# Patient Record
Sex: Female | Born: 1990 | Race: Black or African American | Hispanic: No | Marital: Single | Smoking: Former smoker
Health system: Southern US, Community
[De-identification: ages and names within clinical notes are randomized; demographics above are authoritative.]

## PROBLEM LIST (undated history)

## (undated) ENCOUNTER — Inpatient Hospital Stay (HOSPITAL_COMMUNITY): Payer: Self-pay

## (undated) DIAGNOSIS — Z789 Other specified health status: Secondary | ICD-10-CM

## (undated) DIAGNOSIS — O139 Gestational [pregnancy-induced] hypertension without significant proteinuria, unspecified trimester: Secondary | ICD-10-CM

## (undated) DIAGNOSIS — Z9889 Other specified postprocedural states: Secondary | ICD-10-CM

## (undated) DIAGNOSIS — R112 Nausea with vomiting, unspecified: Secondary | ICD-10-CM

## (undated) HISTORY — PX: CLAVICLE SURGERY: SHX598

## (undated) HISTORY — PX: INDUCED ABORTION: SHX677

---
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
## 2011-01-04 NOTE — L&D Delivery Note (Signed)
Operative Delivery Note At 1:00 PM a viable female was delivered via .  Presentation: OA; Position: Left,; Station: +5.  Delivery of the head:   , McRoberts Second maneuver: , Woods screw (fetal shoulder rotation)  Fundal pressure was not applied.  The arm under the symphisis was: right  Weight 8 lb 8 oz (3856 g).   Placenta status: delivered intact/3VC     Anesthesia: Epidural  Episiotomy: None Lacerations: Periurethral Suture Repair: 3.0 vicryl rapide Est. Blood Loss (mL): 300 ml   Mom to postpartum.  Baby to nursery-stable.  JACKSON-MOORE,Jayden Rudge A 04/25/2011, 1:23 PM

## 2011-03-10 ENCOUNTER — Other Ambulatory Visit: Payer: Self-pay | Admitting: Obstetrics

## 2011-03-10 DIAGNOSIS — Z0489 Encounter for examination and observation for other specified reasons: Secondary | ICD-10-CM

## 2011-03-17 ENCOUNTER — Ambulatory Visit (HOSPITAL_COMMUNITY): Payer: Medicaid Other

## 2011-03-18 ENCOUNTER — Ambulatory Visit (HOSPITAL_COMMUNITY)
Admission: RE | Admit: 2011-03-18 | Discharge: 2011-03-18 | Disposition: A | Discharge status: Home / Self Care | Payer: Medicaid Other | Source: Ambulatory Visit | Attending: Obstetrics | Admitting: Obstetrics

## 2011-03-18 DIAGNOSIS — Z0489 Encounter for examination and observation for other specified reasons: Secondary | ICD-10-CM

## 2011-03-18 DIAGNOSIS — Z3689 Encounter for other specified antenatal screening: Secondary | ICD-10-CM | POA: Insufficient documentation

## 2011-03-24 LAB — OB RESULTS CONSOLE GBS: GBS: POSITIVE

## 2011-04-14 ENCOUNTER — Encounter (HOSPITAL_COMMUNITY): Payer: Self-pay | Admitting: *Deleted

## 2011-04-14 ENCOUNTER — Inpatient Hospital Stay (HOSPITAL_COMMUNITY)
Admission: AD | Admit: 2011-04-14 | Discharge: 2011-04-14 | Disposition: A | Discharge status: Home / Self Care | Payer: Medicaid Other | Attending: Obstetrics & Gynecology | Admitting: Obstetrics & Gynecology

## 2011-04-14 DIAGNOSIS — O479 False labor, unspecified: Secondary | ICD-10-CM | POA: Insufficient documentation

## 2011-04-14 NOTE — Progress Notes (Signed)
Dr. Tamela Oddi notified of pt presenting for labor check.  Notified of VE and ctx pattern.  Orders received to monitor for one hour and recheck cervix. If no change may dc home.

## 2011-04-14 NOTE — Discharge Instructions (Signed)
Return to MAU with any worsening symptoms.  Call your doctor with any concerns or questions.

## 2011-04-14 NOTE — MAU Note (Signed)
Pt reports contractions. Denies bleeding or ROM. 

## 2011-04-25 ENCOUNTER — Inpatient Hospital Stay (HOSPITAL_COMMUNITY): Payer: Medicaid Other | Admitting: Anesthesiology

## 2011-04-25 ENCOUNTER — Inpatient Hospital Stay (HOSPITAL_COMMUNITY)
Admission: AD | Admit: 2011-04-25 | Discharge: 2011-04-27 | DRG: 775 | Disposition: A | Discharge status: Home / Self Care | Payer: Medicaid Other | Source: Ambulatory Visit | Attending: Obstetrics & Gynecology | Admitting: Obstetrics & Gynecology

## 2011-04-25 ENCOUNTER — Encounter (HOSPITAL_COMMUNITY): Payer: Self-pay | Admitting: *Deleted

## 2011-04-25 ENCOUNTER — Encounter (HOSPITAL_COMMUNITY): Payer: Self-pay | Admitting: Anesthesiology

## 2011-04-25 ENCOUNTER — Encounter (HOSPITAL_COMMUNITY): Payer: Self-pay | Admitting: Obstetrics & Gynecology

## 2011-04-25 DIAGNOSIS — IMO0001 Reserved for inherently not codable concepts without codable children: Secondary | ICD-10-CM

## 2011-04-25 DIAGNOSIS — O99892 Other specified diseases and conditions complicating childbirth: Secondary | ICD-10-CM | POA: Diagnosis present

## 2011-04-25 DIAGNOSIS — Z22330 Carrier of Group B streptococcus: Secondary | ICD-10-CM

## 2011-04-25 LAB — CBC
HCT: 29.7 % — ABNORMAL LOW (ref 36.0–46.0)
Hemoglobin: 9.7 g/dL — ABNORMAL LOW (ref 12.0–15.0)
Platelets: 233 10*3/uL (ref 150–400)
WBC: 7 10*3/uL (ref 4.0–10.5)

## 2011-04-25 MED ORDER — FENTANYL 2.5 MCG/ML BUPIVACAINE 1/10 % EPIDURAL INFUSION (WH - ANES)
14.0000 mL/h | INTRAMUSCULAR | Status: DC
  Administered 2011-04-25: 14 mL/h via EPIDURAL
  Filled 2011-04-25 (×2): qty 60

## 2011-04-25 MED ORDER — WITCH HAZEL-GLYCERIN EX PADS: 1.0000 "application " | MEDICATED_PAD | CUTANEOUS | Status: DC | PRN

## 2011-04-25 MED ORDER — ZOLPIDEM TARTRATE 5 MG PO TABS: 5.0000 mg | ORAL_TABLET | Freq: Every evening | ORAL | Status: DC | PRN

## 2011-04-25 MED ORDER — LACTATED RINGERS IV SOLN: 500.0000 mL | INTRAVENOUS | Status: DC | PRN

## 2011-04-25 MED ORDER — FERROUS SULFATE 325 (65 FE) MG PO TABS
325.0000 mg | ORAL_TABLET | Freq: Two times a day (BID) | ORAL | Status: DC
  Administered 2011-04-25 – 2011-04-27 (×3): 325 mg via ORAL
  Filled 2011-04-25 (×4): qty 1

## 2011-04-25 MED ORDER — OXYCODONE-ACETAMINOPHEN 5-325 MG PO TABS
1.0000 | ORAL_TABLET | ORAL | Status: DC | PRN
  Administered 2011-04-26 (×2): 1 via ORAL
  Filled 2011-04-25 (×2): qty 1
  Filled 2011-04-25: qty 2

## 2011-04-25 MED ORDER — MEASLES, MUMPS & RUBELLA VAC ~~LOC~~ INJ: 0.5000 mL | INJECTION | Freq: Once | SUBCUTANEOUS | Status: DC

## 2011-04-25 MED ORDER — SENNOSIDES-DOCUSATE SODIUM 8.6-50 MG PO TABS
2.0000 | ORAL_TABLET | Freq: Every day | ORAL | Status: DC
  Administered 2011-04-25 – 2011-04-26 (×2): 2 via ORAL

## 2011-04-25 MED ORDER — EPHEDRINE 5 MG/ML INJ
10.0000 mg | INTRAVENOUS | Status: DC | PRN
  Filled 2011-04-25: qty 4
  Filled 2011-04-25: qty 2

## 2011-04-25 MED ORDER — EPHEDRINE 5 MG/ML INJ
10.0000 mg | INTRAVENOUS | Status: DC | PRN
  Filled 2011-04-25: qty 2

## 2011-04-25 MED ORDER — DIBUCAINE 1 % RE OINT: 1.0000 "application " | TOPICAL_OINTMENT | RECTAL | Status: DC | PRN

## 2011-04-25 MED ORDER — LANOLIN HYDROUS EX OINT: TOPICAL_OINTMENT | CUTANEOUS | Status: DC | PRN

## 2011-04-25 MED ORDER — OXYTOCIN BOLUS FROM INFUSION
500.0000 mL | Freq: Once | INTRAVENOUS | Status: DC
  Filled 2011-04-25: qty 1000
  Filled 2011-04-25: qty 500

## 2011-04-25 MED ORDER — TETANUS-DIPHTH-ACELL PERTUSSIS 5-2.5-18.5 LF-MCG/0.5 IM SUSP: 0.5000 mL | Freq: Once | INTRAMUSCULAR | Status: DC

## 2011-04-25 MED ORDER — ONDANSETRON HCL 4 MG PO TABS
4.0000 mg | ORAL_TABLET | ORAL | Status: DC | PRN
  Administered 2011-04-25: 4 mg via ORAL
  Filled 2011-04-25: qty 1

## 2011-04-25 MED ORDER — MAGNESIUM HYDROXIDE 400 MG/5ML PO SUSP: 30.0000 mL | ORAL | Status: DC | PRN

## 2011-04-25 MED ORDER — FLEET ENEMA 7-19 GM/118ML RE ENEM: 1.0000 | ENEMA | RECTAL | Status: DC | PRN

## 2011-04-25 MED ORDER — MEDROXYPROGESTERONE ACETATE 150 MG/ML IM SUSP: 150.0000 mg | INTRAMUSCULAR | Status: DC | PRN

## 2011-04-25 MED ORDER — LACTATED RINGERS IV SOLN
INTRAVENOUS | Status: DC
  Administered 2011-04-25: 10:00:00 via INTRAVENOUS

## 2011-04-25 MED ORDER — ONDANSETRON HCL 4 MG/2ML IJ SOLN: 4.0000 mg | INTRAMUSCULAR | Status: DC | PRN

## 2011-04-25 MED ORDER — LACTATED RINGERS IV SOLN
INTRAVENOUS | Status: DC
  Administered 2011-04-25 (×3): via INTRAVENOUS

## 2011-04-25 MED ORDER — IBUPROFEN 600 MG PO TABS
600.0000 mg | ORAL_TABLET | Freq: Four times a day (QID) | ORAL | Status: DC
  Administered 2011-04-25 – 2011-04-27 (×7): 600 mg via ORAL
  Filled 2011-04-25 (×8): qty 1

## 2011-04-25 MED ORDER — DIPHENHYDRAMINE HCL 50 MG/ML IJ SOLN: 12.5000 mg | INTRAMUSCULAR | Status: DC | PRN

## 2011-04-25 MED ORDER — PENICILLIN G POTASSIUM 5000000 UNITS IJ SOLR
2.5000 10*6.[IU] | INTRAMUSCULAR | Status: DC
  Administered 2011-04-25: 2.5 10*6.[IU] via INTRAVENOUS
  Filled 2011-04-25 (×5): qty 2.5

## 2011-04-25 MED ORDER — LIDOCAINE HCL (PF) 1 % IJ SOLN
30.0000 mL | INTRAMUSCULAR | Status: AC | PRN
  Administered 2011-04-25: 30 mL via SUBCUTANEOUS
  Filled 2011-04-25: qty 30

## 2011-04-25 MED ORDER — ACETAMINOPHEN 325 MG PO TABS: 650.0000 mg | ORAL_TABLET | ORAL | Status: DC | PRN

## 2011-04-25 MED ORDER — OXYTOCIN 20 UNITS IN LACTATED RINGERS INFUSION - SIMPLE
125.0000 mL/h | Freq: Once | INTRAVENOUS | Status: AC
  Administered 2011-04-25: 999 mL/h via INTRAVENOUS

## 2011-04-25 MED ORDER — PENICILLIN G POTASSIUM 5000000 UNITS IJ SOLR
5.0000 10*6.[IU] | Freq: Once | INTRAVENOUS | Status: AC
  Administered 2011-04-25: 5 10*6.[IU] via INTRAVENOUS
  Filled 2011-04-25: qty 5

## 2011-04-25 MED ORDER — MISOPROSTOL 50MCG HALF TABLET
75.0000 ug | ORAL_TABLET | ORAL | Status: DC
  Administered 2011-04-25: 75 ug via ORAL
  Filled 2011-04-25: qty 1
  Filled 2011-04-25: qty 0.75
  Filled 2011-04-25: qty 1

## 2011-04-25 MED ORDER — DIPHENHYDRAMINE HCL 25 MG PO CAPS: 25.0000 mg | ORAL_CAPSULE | Freq: Four times a day (QID) | ORAL | Status: DC | PRN

## 2011-04-25 MED ORDER — FENTANYL 2.5 MCG/ML BUPIVACAINE 1/10 % EPIDURAL INFUSION (WH - ANES)
INTRAMUSCULAR | Status: DC | PRN
  Administered 2011-04-25: 14 mL/h via EPIDURAL

## 2011-04-25 MED ORDER — LIDOCAINE HCL (PF) 1 % IJ SOLN
INTRAMUSCULAR | Status: DC | PRN
  Administered 2011-04-25 (×2): 8 mL

## 2011-04-25 MED ORDER — ONDANSETRON HCL 4 MG/2ML IJ SOLN
4.0000 mg | Freq: Four times a day (QID) | INTRAMUSCULAR | Status: DC | PRN
  Administered 2011-04-25: 4 mg via INTRAVENOUS
  Filled 2011-04-25: qty 2

## 2011-04-25 MED ORDER — IBUPROFEN 600 MG PO TABS: 600.0000 mg | ORAL_TABLET | Freq: Four times a day (QID) | ORAL | Status: DC | PRN

## 2011-04-25 MED ORDER — PRENATAL MULTIVITAMIN CH
1.0000 | ORAL_TABLET | Freq: Every day | ORAL | Status: DC
  Administered 2011-04-26 – 2011-04-27 (×2): 1 via ORAL
  Filled 2011-04-25: qty 1

## 2011-04-25 MED ORDER — LACTATED RINGERS IV SOLN: 500.0000 mL | Freq: Once | INTRAVENOUS | Status: DC

## 2011-04-25 MED ORDER — BENZOCAINE-MENTHOL 20-0.5 % EX AERO
1.0000 "application " | INHALATION_SPRAY | CUTANEOUS | Status: DC | PRN
  Filled 2011-04-25: qty 56

## 2011-04-25 MED ORDER — OXYCODONE-ACETAMINOPHEN 5-325 MG PO TABS: 1.0000 | ORAL_TABLET | ORAL | Status: DC | PRN

## 2011-04-25 MED ORDER — PHENYLEPHRINE 40 MCG/ML (10ML) SYRINGE FOR IV PUSH (FOR BLOOD PRESSURE SUPPORT)
80.0000 ug | PREFILLED_SYRINGE | INTRAVENOUS | Status: DC | PRN
  Filled 2011-04-25: qty 2

## 2011-04-25 MED ORDER — CITRIC ACID-SODIUM CITRATE 334-500 MG/5ML PO SOLN: 30.0000 mL | ORAL | Status: DC | PRN

## 2011-04-25 MED ORDER — PHENYLEPHRINE 40 MCG/ML (10ML) SYRINGE FOR IV PUSH (FOR BLOOD PRESSURE SUPPORT)
80.0000 ug | PREFILLED_SYRINGE | INTRAVENOUS | Status: DC | PRN
  Filled 2011-04-25: qty 5
  Filled 2011-04-25: qty 2

## 2011-04-25 NOTE — Anesthesia Preprocedure Evaluation (Addendum)

## 2011-04-25 NOTE — H&P (Signed)
Pamela Schaefer is Schaefer 21 y.o. female presenting for SROM, contractions. Maternal Medical History:  Reason for admission: Reason for admission: rupture of membranes and contractions.  Reason for Admission:   nauseaContractions: Frequency: regular.   Perceived severity is moderate.    Prenatal complications: no prenatal complications   OB History    Grav Para Term Preterm Abortions TAB SAB Ect Mult Living   3 1 1  0 1 0 1 0 0 1     Past Medical History  Diagnosis Date  . No pertinent past medical history    Past Surgical History  Procedure Date  . No past surgeries    Family History: family history includes Stroke in her maternal grandmother. Social History:  reports that she has quit smoking. She has never used smokeless tobacco. She reports that she does not drink alcohol or use illicit drugs.  Review of Systems  Constitutional: Negative for fever.  Eyes: Negative for blurred vision.  Respiratory: Negative for shortness of breath.   Gastrointestinal: Negative for nausea and vomiting.  Skin: Negative for rash.  Neurological: Negative for headaches.    Dilation: 5 Effacement (%): 70 Station: -2 Exam by:: Clearence Cheek RN Blood pressure 129/80, pulse 91, temperature 98.9 F (37.2 C), temperature source Oral, resp. rate 20, height 5\' 3"  (1.6 m), weight 79.379 kg (175 lb), last menstrual period 06/12/2010, SpO2 100.00%. Maternal Exam:  Uterine Assessment: Contraction frequency is irregular.   Abdomen: Patient reports no abdominal tenderness. Fetal presentation: vertex  Introitus: not evaluated.   Cervix: Cervix evaluated by digital exam.     Fetal Exam Fetal Monitor Review: Variability: moderate (6-25 bpm).   Pattern: early decelerations.    Fetal State Assessment: Category I - tracings are normal.     Physical Exam  Constitutional: She appears well-developed.  HENT:  Head: Normocephalic.  Neck: Neck supple. No thyromegaly present.  Cardiovascular: Normal  rate and regular rhythm.   Respiratory: Breath sounds normal.  GI: Soft. Bowel sounds are normal.  Skin: No rash noted.    Prenatal labs: ABO, Rh: --/--/O POS (07/15 1610) Antibody:   Rubella:   RPR:    HBsAg:    HIV:    GBS: Positive (03/21 0000)   Assessment/Plan: Primipara at term, active labor, Category 1 FHT. Admit, anticipate an NSVD  JACKSON-MOORE,Pamela Schaefer 04/25/2011, 8:51 AM

## 2011-04-25 NOTE — MAU Note (Signed)
Dr. Jackson-Moore notified of pt, orders rec'd. 

## 2011-04-25 NOTE — MAU Note (Signed)
Pt report ?leaking fluid since 0500, some contractions

## 2011-04-25 NOTE — Anesthesia Procedure Notes (Signed)
Epidural Patient location during procedure: OB Start time: 04/25/2011 8:15 AM End time: 04/25/2011 8:20 AM Reason for block: procedure for pain  Staffing Anesthesiologist: Sandrea Hughs  Preanesthetic Checklist Completed: patient identified, site marked, surgical consent, pre-op evaluation, timeout performed, IV checked, risks and benefits discussed and monitors and equipment checked  Epidural Patient position: sitting Prep: site prepped and draped and DuraPrep Patient monitoring: continuous pulse ox and blood pressure Approach: midline Injection technique: LOR air  Needle:  Needle type: Tuohy  Needle gauge: 17 G Needle length: 9 cm Needle insertion depth: 6 cm Catheter type: closed end flexible Catheter size: 19 Gauge Catheter at skin depth: 11 cm Test dose: negative and Other  Assessment Events: blood not aspirated, injection not painful, no injection resistance, negative IV test and no paresthesia

## 2011-04-26 NOTE — Anesthesia Postprocedure Evaluation (Signed)
  Anesthesia Post-op Note  Patient: Pamela Schaefer  Procedure(s) Performed: * No procedures listed * 119 Patient Location:   Anesthesia Type: Epidural  Level of Consciousness: awake, alert  and oriented  Airway and Oxygen Therapy: Patient Spontanous Breathing  Post-op Pain: mild  Post-op Assessment: Post-op Vital signs reviewed, Patient's Cardiovascular Status Stable, No headache, No backache, No residual numbness and No residual motor weakness  Post-op Vital Signs: Reviewed and stable  Complications: No apparent anesthesia complications

## 2011-04-26 NOTE — Progress Notes (Signed)
Post Partum Day 1 Subjective: no complaints, up ad lib, voiding, tolerating PO and + flatus  Objective: Blood pressure 112/69, pulse 64, temperature 98.3 F (36.8 C), temperature source Oral, resp. rate 18, height 5\' 3"  (1.6 m), weight 79.379 kg (175 lb), last menstrual period 06/12/2010, SpO2 98.00%, unknown if currently breastfeeding.  Physical Exam:  General: alert and no distress Lochia: appropriate Uterine Fundus: firm Incision: healing well DVT Evaluation: No evidence of DVT seen on physical exam.   Basename 04/25/11 0714  HGB 9.7*  HCT 29.7*    Assessment/Plan: Plan for discharge tomorrow   LOS: 1 day   Jaykwon Morones A 04/26/2011, 9:57 AM

## 2011-04-26 NOTE — Progress Notes (Signed)
UR chart review completed.  

## 2011-04-27 MED ORDER — OXYCODONE-ACETAMINOPHEN 5-325 MG PO TABS: 1.0000 | ORAL_TABLET | ORAL | Status: AC | PRN

## 2011-04-27 MED ORDER — IBUPROFEN 600 MG PO TABS: 600.0000 mg | ORAL_TABLET | Freq: Four times a day (QID) | ORAL | Status: DC

## 2011-04-27 NOTE — Progress Notes (Signed)
Post Partum Day 2 Subjective: no complaints  Objective: Blood pressure 105/65, pulse 69, temperature 98.7 F (37.1 C), temperature source Oral, resp. rate 18, height 5\' 3"  (1.6 m), weight 79.379 kg (175 lb), last menstrual period 06/12/2010, SpO2 98.00%, unknown if currently breastfeeding.  Physical Exam:  General: alert and no distress Lochia: appropriate Uterine Fundus: firm Incision: healing well DVT Evaluation: No evidence of DVT seen on physical exam.   Basename 04/25/11 0714  HGB 9.7*  HCT 29.7*    Assessment/Plan: Discharge home   LOS: 2 days   Pamela Schaefer A 04/27/2011, 7:46 AM

## 2011-04-27 NOTE — Discharge Summary (Signed)
Obstetric Discharge Summary Reason for Admission: onset of labor Prenatal Procedures: ultrasound Intrapartum Procedures: spontaneous vaginal delivery Postpartum Procedures: none Complications-Operative and Postpartum: none Hemoglobin  Date Value Range Status  04/25/2011 9.7* 12.0-15.0 (g/dL) Final     HCT  Date Value Range Status  04/25/2011 29.7* 36.0-46.0 (%) Final    Physical Exam:  General: alert and no distress Lochia: appropriate Uterine Fundus: firm Incision: healing well DVT Evaluation: No evidence of DVT seen on physical exam.  Discharge Diagnoses: Term Pregnancy-delivered  Discharge Information: Date: 04/27/2011 Activity: pelvic rest Diet: routine Medications: PNV, Ibuprofen, Colace and Percocet Condition: stable Instructions: refer to practice specific booklet Discharge to: home Follow-up Information    Follow up with Ling Flesch A, MD. Schedule an appointment as soon as possible for a visit in 6 weeks.   Contact information:   [address] [place] [address] [phone]          Newborn Data: Live born female  Birth Weight: 8 lb 8 oz (3856 g) APGAR: 8, 9  Home with mother.  Anelle Parlow A 04/27/2011, 8:32 AM

## 2011-04-27 NOTE — Addendum Note (Signed)
Addendum  created 04/27/11 1251 by Sandrea Hughs., MD   Modules edited:Anesthesia Review and Sign Navigator Section, Charting, Inpatient Notes

## 2011-04-27 NOTE — Addendum Note (Signed)
Addendum  created 04/27/11 1251 by John Mccall Will Jr., MD   Modules edited:Anesthesia Review and Sign Navigator Section, Charting, Inpatient Notes    

## 2011-07-25 ENCOUNTER — Encounter (HOSPITAL_COMMUNITY): Payer: Self-pay | Admitting: Emergency Medicine

## 2011-07-25 ENCOUNTER — Emergency Department (HOSPITAL_COMMUNITY): Payer: Medicaid Other

## 2011-07-25 ENCOUNTER — Emergency Department (HOSPITAL_COMMUNITY)
Admission: EM | Admit: 2011-07-25 | Discharge: 2011-07-25 | Disposition: A | Discharge status: Home / Self Care | Payer: Medicaid Other | Attending: Emergency Medicine | Admitting: Emergency Medicine

## 2011-07-25 DIAGNOSIS — R079 Chest pain, unspecified: Secondary | ICD-10-CM | POA: Insufficient documentation

## 2011-07-25 LAB — URINALYSIS, ROUTINE W REFLEX MICROSCOPIC
Bilirubin Urine: NEGATIVE
Glucose, UA: NEGATIVE mg/dL
Ketones, ur: NEGATIVE mg/dL
Leukocytes, UA: NEGATIVE
Specific Gravity, Urine: 1.024 (ref 1.005–1.030)
pH: 5.5 (ref 5.0–8.0)

## 2011-07-25 LAB — D-DIMER, QUANTITATIVE: D-Dimer, Quant: 0.34 ug/mL-FEU (ref 0.00–0.48)

## 2011-07-25 LAB — CBC
HCT: 37.5 % (ref 36.0–46.0)
Hemoglobin: 12.3 g/dL (ref 12.0–15.0)
MCV: 85.6 fL (ref 78.0–100.0)
RBC: 4.38 MIL/uL (ref 3.87–5.11)
WBC: 8.3 10*3/uL (ref 4.0–10.5)

## 2011-07-25 LAB — BASIC METABOLIC PANEL
BUN: 13 mg/dL (ref 6–23)
CO2: 26 mEq/L (ref 19–32)
Chloride: 104 mEq/L (ref 96–112)
Creatinine, Ser: 0.86 mg/dL (ref 0.50–1.10)

## 2011-07-25 MED ORDER — OXYCODONE-ACETAMINOPHEN 5-325 MG PO TABS: 1.0000 | ORAL_TABLET | Freq: Four times a day (QID) | ORAL | Status: AC | PRN

## 2011-07-25 NOTE — ED Notes (Signed)
Pt escorted from waiting room to CDU by this RN. Patient with food and drink in hand. Steady gait. No SOB or diaphoresis noted with ambulation. Patient with her son and attending to him appropriately

## 2011-07-25 NOTE — ED Provider Notes (Signed)
History     CSN: [id number]  Arrival date & time 07/25/11  1447   First MD Initiated Contact with Patient 07/25/11 1745      Chief Complaint  Patient presents with  . Chest Pain    (Consider location/radiation/quality/duration/timing/severity/associated sxs/prior treatment) Patient is a 21 y.o. female presenting with chest pain. The history is provided by the patient.  Chest Pain Pertinent negatives for primary symptoms include no shortness of breath, no abdominal pain, no nausea and no vomiting.  Pertinent negatives for associated symptoms include no numbness and no weakness.    patient developed sharp left-sided chest pain. Began today. Is worse with deep breath. No trauma. She states she's had a little bit of nausea since. No fevers. She's not on birth control. It is worse with movement. Is worse with deep breath. She's not had pains like this before.  Past Medical History  Diagnosis Date  . No pertinent past medical history     Past Surgical History  Procedure Date  . No past surgeries   . Clavicle surgery     Family History  Problem Relation Age of Onset  . Stroke Maternal Grandmother     History  Substance Use Topics  . Smoking status: Former Games developer  . Smokeless tobacco: Never Used   Comment: quit over year ago  . Alcohol Use: No    OB History    Grav Para Term Preterm Abortions TAB SAB Ect Mult Living   3 2 2  0 1 0 1 0 0 2      Review of Systems  Constitutional: Negative for activity change and appetite change.  HENT: Negative for neck stiffness.   Eyes: Negative for pain.  Respiratory: Negative for chest tightness and shortness of breath.   Cardiovascular: Positive for chest pain. Negative for leg swelling.  Gastrointestinal: Negative for nausea, vomiting, abdominal pain and diarrhea.  Genitourinary: Negative for flank pain.  Musculoskeletal: Negative for back pain.  Skin: Negative for rash.  Neurological: Negative for weakness, numbness and  headaches.  Psychiatric/Behavioral: Negative for behavioral problems.    Allergies  Review of patient's allergies indicates no known allergies.  Home Medications   Current Outpatient Rx  Name Route Sig Dispense Refill  . IBUPROFEN 600 MG PO TABS Oral Take 1 tablet (600 mg total) by mouth every 6 (six) hours. 30 tablet 5  . OXYCODONE-ACETAMINOPHEN 5-325 MG PO TABS Oral Take 1-2 tablets by mouth every 6 (six) hours as needed for pain. 10 tablet 0    BP 106/69  Pulse 76  Temp 98.8 F (37.1 C) (Oral)  Resp 17  SpO2 99%  LMP 06/13/2011  Breastfeeding? No  Physical Exam  Nursing note and vitals reviewed. Constitutional: She is oriented to person, place, and time. She appears well-developed and well-nourished.  HENT:  Head: Normocephalic and atraumatic.  Eyes: EOM are normal. Pupils are equal, round, and reactive to light.  Neck: Normal range of motion. Neck supple.  Cardiovascular: Normal rate, regular rhythm and normal heart sounds.   No murmur heard. Pulmonary/Chest: Effort normal and breath sounds normal. No respiratory distress. She has no wheezes. She has no rales. She exhibits tenderness.  Abdominal: Soft. Bowel sounds are normal. She exhibits no distension. There is no tenderness. There is no rebound and no guarding.  Musculoskeletal: Normal range of motion.  Neurological: She is alert and oriented to person, place, and time. No cranial nerve deficit.  Skin: Skin is warm and dry.  Psychiatric: She has a normal  mood and affect. Her speech is normal.    ED Course  Procedures (including critical care time)   Labs Reviewed  CBC  BASIC METABOLIC PANEL  URINALYSIS, ROUTINE W REFLEX MICROSCOPIC  POCT PREGNANCY, URINE  D-DIMER, QUANTITATIVE   Dg Chest 2 View  07/25/2011  *RADIOLOGY REPORT*  Clinical Data: Chest pain since this morning.  CHEST - 2 VIEW  Comparison: 02/11/2009.  Findings:  The heart size and mediastinal contours are within normal limits.  Both lungs are  clear.  The visualized skeletal structures are unremarkable.  IMPRESSION: No active cardiopulmonary disease.  Original Report Authenticated By: Elsie Stain, M.D.     1. Chest pain     Date: 07/25/2011  Rate: 92  Rhythm: normal sinus rhythm  QRS Axis: normal  Intervals: normal  ST/T Wave abnormalities: normal  Conduction Disutrbances:none  Narrative Interpretation:   Old EKG Reviewed: none available     MDM  Sharp chest pain. Worse with palpation. Worse with movement. EKG and labwork is reassuring. D-dimer is negative. Patient be discharged home.        Juliet Rude. Rubin Payor, MD 07/25/11 [phone]

## 2011-07-25 NOTE — ED Notes (Signed)
Pt reports, having L shoulder pain, radiating to L rib cage; pt also reports stabbing CP, hurts worse with deep breath; pt reports hurts with movement as well; report nausea and lightheadedness

## 2011-10-11 ENCOUNTER — Encounter (HOSPITAL_COMMUNITY): Payer: Self-pay | Admitting: Emergency Medicine

## 2011-10-11 ENCOUNTER — Emergency Department (HOSPITAL_COMMUNITY)
Admission: EM | Admit: 2011-10-11 | Discharge: 2011-10-11 | Disposition: A | Discharge status: Home / Self Care | Payer: Medicaid Other | Source: Home / Self Care | Attending: Family Medicine | Admitting: Family Medicine

## 2011-10-11 DIAGNOSIS — H6010 Cellulitis of external ear, unspecified ear: Secondary | ICD-10-CM

## 2011-10-11 MED ORDER — MUPIROCIN 2 % EX OINT: TOPICAL_OINTMENT | Freq: Three times a day (TID) | CUTANEOUS | Status: DC

## 2011-10-11 MED ORDER — CEPHALEXIN 500 MG PO CAPS: 500.0000 mg | ORAL_CAPSULE | Freq: Four times a day (QID) | ORAL | Status: DC

## 2011-10-11 NOTE — ED Provider Notes (Signed)
History     CSN: [id number]  Arrival date & time 10/11/11  1430   First MD Initiated Contact with Patient 10/11/11 1436      Chief Complaint  Patient presents with  . Otalgia    (Consider location/radiation/quality/duration/timing/severity/associated sxs/prior treatment) The history is provided by the patient.   Ear lobe pain (bilateral) that began after wearing a temporary set of earrings from the beauty parlor for several days.  This was 4-5 days ago.  Since then pain has been constant and associated with some drainage from piercings.  She has warmth and pruritis there as well.  No n/v/d, some subjective low grade fevers.  No other symptoms.  No rashes.  No difficulties swallowing.  Past Medical History  Diagnosis Date  . No pertinent past medical history     Past Surgical History  Procedure Date  . No past surgeries   . Clavicle surgery     Family History  Problem Relation Age of Onset  . Stroke Maternal Grandmother     History  Substance Use Topics  . Smoking status: Former Games developer  . Smokeless tobacco: Never Used   Comment: quit over year ago  . Alcohol Use: No    OB History    Grav Para Term Preterm Abortions TAB SAB Ect Mult Living   3 2 2  0 1 0 1 0 0 2      Review of Systems  Constitutional: Positive for fever and chills. Negative for diaphoresis and fatigue.  HENT: Positive for ear pain and ear discharge. Negative for hearing loss, facial swelling, trouble swallowing, neck pain and voice change.   Gastrointestinal: Negative for nausea, vomiting and diarrhea.  Musculoskeletal: Negative for arthralgias.  Skin: Negative for rash.    Allergies  Review of patient's allergies indicates no known allergies.  Home Medications   Current Outpatient Rx  Name Route Sig Dispense Refill  . CEPHALEXIN 500 MG PO CAPS Oral Take 1 capsule (500 mg total) by mouth 4 (four) times daily. 28 capsule 0  . IBUPROFEN 600 MG PO TABS Oral Take 1 tablet (600 mg total) by  mouth every 6 (six) hours. 30 tablet 5  . MUPIROCIN 2 % EX OINT Topical Apply topically 3 (three) times daily. 22 g 0    BP 124/77  Pulse 68  Temp 98 F (36.7 C) (Oral)  Resp 16  SpO2 100%  LMP 09/21/2011  Breastfeeding? No  Physical Exam  Constitutional: She is oriented to person, place, and time. She appears well-developed and well-nourished. No distress.  HENT:  Right Ear: Hearing, tympanic membrane and ear canal normal.  Left Ear: Hearing, tympanic membrane and ear canal normal.  Ears:  Mouth/Throat: Uvula is midline, oropharynx is clear and moist and mucous membranes are normal.  Neck: Normal range of motion.  Musculoskeletal: She exhibits no edema.  Lymphadenopathy:    She has no cervical adenopathy.  Neurological: She is alert and oriented to person, place, and time.  Skin: Skin is warm and dry. No rash noted.  Psychiatric: She has a normal mood and affect. Her behavior is normal. Thought content normal.    ED Course  Procedures (including critical care time)  Labs Reviewed - No data to display No results found.   1. Cellulitis of earlobe       MDM  Superficial cellulitis.  I do wonder if there was a contact/allergic dermatitis that started her symptoms.  Will treat with oral abx and topical cream.  RTC if not  improving in 3-5 days.        Brent Bulla, MD 10/11/11 [phone]

## 2011-10-11 NOTE — ED Provider Notes (Signed)
Medical screening examination/treatment/procedure(s) were performed by resident physician or non-physician practitioner and as supervising physician I was immediately available for consultation/collaboration.   Valisa Karpel DOUGLAS MD.    Kamela Blansett D Zarie Kosiba, MD 10/11/11 2055 

## 2011-10-11 NOTE — ED Notes (Signed)
Reports bilateral ear pain. Rash to both ear lobes.  Patient does have pierced ear lobes.

## 2012-01-04 NOTE — L&D Delivery Note (Signed)
Delivery Note At 10:29 AM a viable female was delivered via Vaginal, Spontaneous Delivery (Presentation: ;  ).  APGAR: 8, 9; weight .   Placenta status: Intact, Spontaneous.  Cord: 3 vessels with the following complications: None.  Cord pH: not done  Anesthesia: Epidural  Episiotomy: None Lacerations: None Suture Repair: 2.0 Est. Blood Loss (mL): 250  Mom to postpartum.  Baby to Couplet care / Skin to Skin.  MARSHALL,BERNARD A 12/08/2012, 10:47 AM

## 2012-01-13 ENCOUNTER — Other Ambulatory Visit: Payer: Self-pay | Admitting: Obstetrics

## 2012-01-13 DIAGNOSIS — R102 Pelvic and perineal pain: Secondary | ICD-10-CM

## 2012-01-24 ENCOUNTER — Ambulatory Visit (HOSPITAL_COMMUNITY)
Admission: RE | Admit: 2012-01-24 | Discharge: 2012-01-24 | Disposition: A | Discharge status: Home / Self Care | Payer: Medicaid Other | Source: Ambulatory Visit | Attending: Obstetrics | Admitting: Obstetrics

## 2012-01-24 DIAGNOSIS — N949 Unspecified condition associated with female genital organs and menstrual cycle: Secondary | ICD-10-CM | POA: Insufficient documentation

## 2012-01-24 DIAGNOSIS — R102 Pelvic and perineal pain: Secondary | ICD-10-CM

## 2012-01-24 DIAGNOSIS — N8300 Follicular cyst of ovary, unspecified side: Secondary | ICD-10-CM | POA: Insufficient documentation

## 2012-03-02 ENCOUNTER — Encounter (HOSPITAL_COMMUNITY): Payer: Self-pay | Admitting: Emergency Medicine

## 2012-03-02 ENCOUNTER — Emergency Department (HOSPITAL_COMMUNITY): Payer: Medicaid Other

## 2012-03-02 ENCOUNTER — Emergency Department (HOSPITAL_COMMUNITY)
Admission: EM | Admit: 2012-03-02 | Discharge: 2012-03-02 | Disposition: A | Discharge status: Home / Self Care | Payer: Medicaid Other | Attending: Emergency Medicine | Admitting: Emergency Medicine

## 2012-03-02 DIAGNOSIS — H9209 Otalgia, unspecified ear: Secondary | ICD-10-CM | POA: Insufficient documentation

## 2012-03-02 DIAGNOSIS — M7989 Other specified soft tissue disorders: Secondary | ICD-10-CM | POA: Insufficient documentation

## 2012-03-02 DIAGNOSIS — Z9889 Other specified postprocedural states: Secondary | ICD-10-CM | POA: Insufficient documentation

## 2012-03-02 DIAGNOSIS — G8918 Other acute postprocedural pain: Secondary | ICD-10-CM | POA: Insufficient documentation

## 2012-03-02 DIAGNOSIS — R630 Anorexia: Secondary | ICD-10-CM | POA: Insufficient documentation

## 2012-03-02 DIAGNOSIS — R220 Localized swelling, mass and lump, head: Secondary | ICD-10-CM

## 2012-03-02 DIAGNOSIS — Z87891 Personal history of nicotine dependence: Secondary | ICD-10-CM | POA: Insufficient documentation

## 2012-03-02 MED ORDER — IOHEXOL 300 MG/ML  SOLN
75.0000 mL | Freq: Once | INTRAMUSCULAR | Status: AC | PRN
  Administered 2012-03-02: 75 mL via INTRAVENOUS

## 2012-03-02 MED ORDER — MORPHINE SULFATE 4 MG/ML IJ SOLN
2.0000 mg | Freq: Once | INTRAMUSCULAR | Status: AC
  Administered 2012-03-02: 2 mg via INTRAVENOUS
  Filled 2012-03-02: qty 1

## 2012-03-02 MED ORDER — OXYCODONE-ACETAMINOPHEN 5-325 MG PO TABS: 1.0000 | ORAL_TABLET | ORAL | Status: DC | PRN

## 2012-03-02 MED ORDER — MORPHINE SULFATE 4 MG/ML IJ SOLN
4.0000 mg | Freq: Once | INTRAMUSCULAR | Status: AC
  Administered 2012-03-02: 4 mg via INTRAVENOUS
  Filled 2012-03-02: qty 1

## 2012-03-02 MED ORDER — MORPHINE SULFATE 4 MG/ML IJ SOLN
2.0000 mg | Freq: Once | INTRAMUSCULAR | Status: AC
  Administered 2012-03-02: 2 mg via INTRAVENOUS

## 2012-03-02 MED ORDER — ONDANSETRON HCL 4 MG/2ML IJ SOLN
4.0000 mg | INTRAMUSCULAR | Status: AC
  Administered 2012-03-02: 4 mg via INTRAVENOUS
  Filled 2012-03-02: qty 2

## 2012-03-02 NOTE — ED Notes (Signed)
NAD noted at time of d/c home 

## 2012-03-02 NOTE — ED Notes (Signed)
Returned from CT.

## 2012-03-02 NOTE — ED Notes (Signed)
Started two days ago with pain and swelling left upper jaw/tooth. Left face extremely swollen.

## 2012-03-02 NOTE — ED Provider Notes (Signed)
History     CSN: [id number]  Arrival date & time 03/02/12  1120   First MD Initiated Contact with Patient 03/02/12 1134      No chief complaint on file.   (Consider location/radiation/quality/duration/timing/severity/associated sxs/prior treatment) HPI Comments: Patient presents for L sided facial swelling x 3 days that has been gradually worsening. Patient admits to having her wisdom teeth removed 5 days ago without complications. Patient started to notice swelling develop 3 days ago along with increased pain that was no longer controlled by the Vicodin she was given to take post-procedure. Patient states the pain is worse with palpation and when moving her jaw; is no longer able to fully open her mouth 2/2 pain and swelling. Denies alleviating factors. Patient denies difficulty swallowing, but states she has not been able to eat and drink for the last two days. Admits to associated L ear pain. Denies fever, ear discharge, nasal congestion or rhinorrhea, oral bleeding, neck pain or stiffness, CP, and SOB. Oral surgeon is Ocie Doyne who put patient on clindamycin QID two days ago without relief of symptoms or swelling.  The history is provided by the patient. No language interpreter was used.    Past Medical History  Diagnosis Date  . No pertinent past medical history     Past Surgical History  Procedure Laterality Date  . No past surgeries    . Clavicle surgery      Family History  Problem Relation Age of Onset  . Stroke Maternal Grandmother     History  Substance Use Topics  . Smoking status: Former Games developer  . Smokeless tobacco: Never Used     Comment: quit over year ago  . Alcohol Use: No    OB History   Grav Para Term Preterm Abortions TAB SAB Ect Mult Living   3 2 2  0 1 0 1 0 0 2      Review of Systems  Constitutional: Positive for appetite change. Negative for fever and chills.  HENT: Positive for ear pain and facial swelling. Negative for hearing loss,  congestion, trouble swallowing, neck pain, neck stiffness and ear discharge.   Eyes: Negative for pain, redness and visual disturbance.  Respiratory: Negative for chest tightness and shortness of breath.   Cardiovascular: Negative for chest pain.  Gastrointestinal: Negative for nausea, vomiting and diarrhea.  Skin: Negative for color change and wound.  Neurological: Negative for syncope, weakness and light-headedness.  All other systems reviewed and are negative.    Allergies  Penicillins  Home Medications   Current Outpatient Rx  Name  Route  Sig  Dispense  Refill  . cephALEXin (KEFLEX) 500 MG capsule   Oral   Take 1 capsule (500 mg total) by mouth 4 (four) times daily.   28 capsule   0   . ibuprofen (ADVIL,MOTRIN) 600 MG tablet   Oral   Take 600 mg by mouth every 6 (six) hours.         Marland Kitchen oxyCODONE-acetaminophen (PERCOCET/ROXICET) 5-325 MG per tablet   Oral   Take 1-2 tablets by mouth every 4 (four) hours as needed for pain.   24 tablet   0     BP 103/69  Pulse 80  Temp(Src) 98.4 F (36.9 C) (Oral)  SpO2 100%  LMP 02/13/2012  Physical Exam  Nursing note and vitals reviewed. Constitutional: She is oriented to person, place, and time. She appears well-developed and well-nourished. No distress.  HENT:  Head: Normocephalic. Head is without raccoon's eyes, without  Battle's sign, without abrasion, without laceration, without right periorbital erythema and without left periorbital erythema.    Right Ear: Tympanic membrane, external ear and ear canal normal. No drainage. Tympanic membrane is not injected and not erythematous.  Left Ear: Tympanic membrane, external ear and ear canal normal. No drainage. Tympanic membrane is not injected and not erythematous.  Nose: Nose normal.  Mouth/Throat: Oropharynx is clear and moist.  Unable to visualize entirety of oropharynx  Eyes: Conjunctivae and EOM are normal. Pupils are equal, round, and reactive to light. Right eye  exhibits no discharge. Left eye exhibits no discharge. No scleral icterus.  Neck: Normal range of motion. Neck supple.  Cardiovascular: Normal rate, regular rhythm and normal heart sounds.   Pulmonary/Chest: Effort normal and breath sounds normal. No respiratory distress. She has no wheezes. She has no rales.  Musculoskeletal: Normal range of motion.  Lymphadenopathy:    She has no cervical adenopathy.  Neurological: She is alert and oriented to person, place, and time.  Skin: Skin is warm and dry. She is not diaphoretic. No erythema.  Psychiatric: She has a normal mood and affect. Her behavior is normal.    ED Course  Procedures (including critical care time)  Labs Reviewed - No data to display Ct Maxillofacial W/cm  03/02/2012  *RADIOLOGY REPORT*  Clinical Data: Pain and swelling in the left side of the face.  CT MAXILLOFACIAL WITH CONTRAST  Technique:  Multidetector CT imaging of the maxillofacial structures was performed with intravenous contrast. Multiplanar CT image reconstructions were also generated.  Contrast: 75mL OMNIPAQUE IOHEXOL 300 MG/ML  SOLN  Comparison: None.  Findings: There is cellulitis and a poorly defined soft tissue abscess in the soft tissues of the left cheek and anterior to the anterior inferior aspect of the parotid gland.  This appears originate from the socket of tooth number 16.   There is a defect in the lateral wall of the socket of tooth number 16.  The masseter muscle is edematous.  Tooth number 17 has also been removed.  There is a fracture into the posterior lateral inferior aspect of the left maxillary sinus with a tiny amount of mucosal thickening.  IMPRESSION:    Cellulitis and poorly defined soft tissue abscess in the left cheek originating from the socket of tooth number 16.  Subtle fracture through the posterior inferior lateral aspect of the socket extending into the left maxillary sinus.   Original Report Authenticated By: Francene Boyers, M.D.      1.  Facial swelling      MDM  Patient presents for L sided facial swelling x 3 after having her wisdom teeth removed b/l. Patient has been taking clindamycin as prescribed by her oral surgeon Ocie Doyne. Denies fevers. Patient sent for CT maxillofacial with contrast to rule out dental abscess or evidence of infection spreading to the mandible. CT significant for cellulitis with evidence of a poorly defined soft tissue abscess. After personal evaluation of the CT, findings not significant enough to warrant drainage in an operating room setting. Have discussed the patient with Dr. Blinda Leatherwood who has seen the CT and does not believe it warrants consult with maxillofacial. Patient has been afebrile and nontoxic appearing with stable vitals. Will d/c the patient with instruction to continue taking the clindamycin as prescribed and to follow up with her oral surgeon as soon as possible. Patient given script for narcotics for pain management and told to return to ED if symptoms worsen. Have discussed plan with Dr.  Pollina who is in agreement.       Antony Madura, PA-C 03/05/12 1355

## 2012-03-07 NOTE — ED Provider Notes (Signed)
Medical screening examination/treatment/procedure(s) were conducted as a shared visit with non-physician practitioner(s) and myself.  I personally evaluated the patient during the encounter.  Patient has significant left sided facial and mandibular area swelling 5 days post wisdom tooth extraction. CT scan does not show any evidence of hematoma or abscess. No airway involvement. Patient on clindamycin, will continue. Patient is to followup with her oral surgeon.  Gilda Crease, MD 03/07/12 [phone]

## 2012-04-10 ENCOUNTER — Ambulatory Visit: Payer: Self-pay | Admitting: Obstetrics

## 2012-04-18 ENCOUNTER — Inpatient Hospital Stay (HOSPITAL_COMMUNITY)
Admission: AD | Admit: 2012-04-18 | Discharge: 2012-04-18 | Disposition: A | Discharge status: Home / Self Care | Payer: Medicaid Other | Source: Ambulatory Visit | Attending: Obstetrics | Admitting: Obstetrics

## 2012-04-18 ENCOUNTER — Inpatient Hospital Stay (HOSPITAL_COMMUNITY): Payer: Medicaid Other

## 2012-04-18 ENCOUNTER — Encounter (HOSPITAL_COMMUNITY): Payer: Self-pay

## 2012-04-18 DIAGNOSIS — N949 Unspecified condition associated with female genital organs and menstrual cycle: Secondary | ICD-10-CM

## 2012-04-18 DIAGNOSIS — O210 Mild hyperemesis gravidarum: Secondary | ICD-10-CM | POA: Insufficient documentation

## 2012-04-18 DIAGNOSIS — O99891 Other specified diseases and conditions complicating pregnancy: Secondary | ICD-10-CM | POA: Insufficient documentation

## 2012-04-18 DIAGNOSIS — Z3490 Encounter for supervision of normal pregnancy, unspecified, unspecified trimester: Secondary | ICD-10-CM

## 2012-04-18 HISTORY — DX: Gestational (pregnancy-induced) hypertension without significant proteinuria, unspecified trimester: O13.9

## 2012-04-18 LAB — CBC WITH DIFFERENTIAL/PLATELET
Basophils Relative: 0 % (ref 0–1)
Eosinophils Absolute: 0.1 10*3/uL (ref 0.0–0.7)
MCH: 28.1 pg (ref 26.0–34.0)
MCHC: 33.3 g/dL (ref 30.0–36.0)
Neutrophils Relative %: 70 % (ref 43–77)
Platelets: 282 10*3/uL (ref 150–400)
RBC: 4.16 MIL/uL (ref 3.87–5.11)
RDW: 14.5 % (ref 11.5–15.5)

## 2012-04-18 LAB — WET PREP, GENITAL
Trich, Wet Prep: NONE SEEN
Yeast Wet Prep HPF POC: NONE SEEN

## 2012-04-18 LAB — URINALYSIS, ROUTINE W REFLEX MICROSCOPIC
Bilirubin Urine: NEGATIVE
Hgb urine dipstick: NEGATIVE
Specific Gravity, Urine: >1.03 — ABNORMAL HIGH (ref 1.005–1.030)
Urobilinogen, UA: 0.2 mg/dL (ref 0.0–1.0)

## 2012-04-18 LAB — HCG, QUANTITATIVE, PREGNANCY: hCG, Beta Chain, Quant, S: 49784 m[IU]/mL — ABNORMAL HIGH (ref <5)

## 2012-04-18 MED ORDER — PROMETHAZINE HCL 12.5 MG PO TABS: 12.5000 mg | ORAL_TABLET | Freq: Four times a day (QID) | ORAL | Status: DC | PRN

## 2012-04-18 NOTE — MAU Note (Signed)
Pt states vaginal discharge thick, light green tinge, sometimes stringy. Denies odor. LMP-03/08/2012

## 2012-04-18 NOTE — MAU Note (Signed)
Patient states she had a positive home pregnancy test about 1 1/2 weeks ago. States she has been having right buttocks pain that radiates down. Has a vaginal discharge. Denies bleeding.

## 2012-04-18 NOTE — MAU Provider Note (Signed)
History     CSN: [id number]  Arrival date and time: 04/18/12 1555   None     Chief Complaint  Patient presents with  . Possible Pregnancy  . Back Pain   HPI Pamela Schaefer is 22 y.o. [phone] [redacted]w[redacted]d weeks presenting with "crazy gooey discharge and I missed my period, lower back pain".  + Home UPT.  Also has a pulling pain on the right side-intermittent..   Sxs X 1 week.  States she had similar sxs with previous miscarriage.  Sexually active X 1 partner.  Not using birth control- did not like OCPs.  Had appt for Depo 4/7 but called office to verify appt and was told the appt was 4/30.  LMP 03/08/12, normal for her.  Patient of Dr. Verdell Carmine.    Past Medical History  Diagnosis Date  . No pertinent past medical history   . Pregnancy induced hypertension     Past Surgical History  Procedure Laterality Date  . Clavicle surgery      Family History  Problem Relation Age of Onset  . Stroke Maternal Grandmother     History  Substance Use Topics  . Smoking status: Current Every Day Smoker -- 0.10 packs/day    Types: Cigarettes  . Smokeless tobacco: Never Used     Comment: quit over year ago  . Alcohol Use: No    Allergies:  Allergies  Allergen Reactions  . Penicillins Itching    Prescriptions prior to admission  Medication Sig Dispense Refill  . ibuprofen (ADVIL,MOTRIN) 600 MG tablet Take 600 mg by mouth every 6 (six) hours as needed for pain.        Review of Systems  Constitutional: Negative for fever and chills.  Gastrointestinal: Positive for abdominal pain (intermittent right lower pain). Negative for nausea and vomiting.  Genitourinary: Negative for dysuria.       Negative for vaginal bleeding.  + for abnormal vaginal discharge  Neurological: Negative for headaches.   Physical Exam   Blood pressure 113/70, pulse 70, temperature 98.5 F (36.9 C), temperature source Oral, resp. rate 16, height 5' 3.5" (1.613 m), weight 143 lb 6.4 oz (65.046 kg), last menstrual  period 03/08/2012, SpO2 100.00%.  Physical Exam  Constitutional: She is oriented to person, place, and time. She appears well-developed and well-nourished. No distress.  HENT:  Head: Normocephalic.  Neck: Normal range of motion.  Cardiovascular: Normal rate.   Respiratory: Effort normal.  GI: Soft. She exhibits no distension and no mass. There is no tenderness. There is no rebound and no guarding.  Genitourinary: There is no rash, tenderness or lesion on the right labia. There is no rash, tenderness or lesion on the left labia. Uterus is not tender. Cervix exhibits no motion tenderness and no discharge. Right adnexum displays tenderness (mild tenderness). Right adnexum displays no mass and no fullness. Left adnexum displays no mass, no tenderness and no fullness. No bleeding around the vagina. Vaginal discharge (moderate amount of mucous without odor) found.  Neurological: She is alert and oriented to person, place, and time.  Skin: Skin is warm and dry.  Psychiatric: She has a normal mood and affect. Her behavior is normal.   Results for orders placed during the hospital encounter of 04/18/12 (from the past 24 hour(s))  URINALYSIS, ROUTINE W REFLEX MICROSCOPIC     Status: Abnormal   Collection Time    04/18/12  4:22 PM      Result Value Range   Color, Urine YELLOW  YELLOW  APPearance CLEAR  CLEAR   Specific Gravity, Urine >1.030 (*) 1.005 - 1.030   pH 6.0  5.0 - 8.0   Glucose, UA NEGATIVE  NEGATIVE mg/dL   Hgb urine dipstick NEGATIVE  NEGATIVE   Bilirubin Urine NEGATIVE  NEGATIVE   Ketones, ur NEGATIVE  NEGATIVE mg/dL   Protein, ur NEGATIVE  NEGATIVE mg/dL   Urobilinogen, UA 0.2  0.0 - 1.0 mg/dL   Nitrite NEGATIVE  NEGATIVE   Leukocytes, UA NEGATIVE  NEGATIVE  POCT PREGNANCY, URINE     Status: Abnormal   Collection Time    04/18/12  4:28 PM      Result Value Range   Preg Test, Ur POSITIVE (*) NEGATIVE  WET PREP, GENITAL     Status: Abnormal   Collection Time    04/18/12  5:30  PM      Result Value Range   Yeast Wet Prep HPF POC NONE SEEN  NONE SEEN   Trich, Wet Prep NONE SEEN  NONE SEEN   Clue Cells Wet Prep HPF POC NONE SEEN  NONE SEEN   WBC, Wet Prep HPF POC FEW BACTERIA SEEN (*) NONE SEEN  CBC WITH DIFFERENTIAL     Status: Abnormal   Collection Time    04/18/12  5:59 PM      Result Value Range   WBC 11.0 (*) 4.0 - 10.5 K/uL   RBC 4.16  3.87 - 5.11 MIL/uL   Hemoglobin 11.7 (*) 12.0 - 15.0 g/dL   HCT 16.1 (*) 09.6 - 04.5 %   MCV 84.4  78.0 - 100.0 fL   MCH 28.1  26.0 - 34.0 pg   MCHC 33.3  30.0 - 36.0 g/dL   RDW 40.9  81.1 - 91.4 %   Platelets 282  150 - 400 K/uL   Neutrophils Relative 70  43 - 77 %   Neutro Abs 7.7  1.7 - 7.7 K/uL   Lymphocytes Relative 25  12 - 46 %   Lymphs Abs 2.7  0.7 - 4.0 K/uL   Monocytes Relative 4  3 - 12 %   Monocytes Absolute 0.4  0.1 - 1.0 K/uL   Eosinophils Relative 1  0 - 5 %   Eosinophils Absolute 0.1  0.0 - 0.7 K/uL   Basophils Relative 0  0 - 1 %   Basophils Absolute 0.0  0.0 - 0.1 K/uL    BLOOD TYPE FROM PREVIOUS RECORD IS O POSITIVE  MAU Course  Procedures  GC/CHL culture to lab  MDM  BHCG pending at time of discharge--have viable IUP At time of discharge, Pamela Dandy, RN states patient is asking for a Rx for nausea.  Rx for Phenergan 12.5mg  po sent to pharmacy. Assessment and Plan  A:  Viable intrauterine pregnancy at [redacted]w[redacted]d gestation  P:  Begin prenatal care with Dr Lissa Hoard 04/18/2012, 7:01 PM

## 2012-04-19 LAB — GC/CHLAMYDIA PROBE AMP: CT Probe RNA: NEGATIVE

## 2012-05-02 ENCOUNTER — Ambulatory Visit: Payer: Self-pay | Admitting: Obstetrics

## 2012-05-24 ENCOUNTER — Ambulatory Visit: Payer: Self-pay | Admitting: Obstetrics

## 2012-07-03 ENCOUNTER — Ambulatory Visit (INDEPENDENT_AMBULATORY_CARE_PROVIDER_SITE_OTHER): Payer: Medicaid Other | Admitting: Obstetrics

## 2012-07-03 ENCOUNTER — Encounter: Payer: Self-pay | Admitting: Obstetrics

## 2012-07-03 ENCOUNTER — Ambulatory Visit (INDEPENDENT_AMBULATORY_CARE_PROVIDER_SITE_OTHER): Payer: Medicaid Other

## 2012-07-03 VITALS — BP 102/65 | Temp 97.6°F | Wt 143.6 lb

## 2012-07-03 DIAGNOSIS — Z3482 Encounter for supervision of other normal pregnancy, second trimester: Secondary | ICD-10-CM

## 2012-07-03 DIAGNOSIS — Z113 Encounter for screening for infections with a predominantly sexual mode of transmission: Secondary | ICD-10-CM

## 2012-07-03 DIAGNOSIS — Z369 Encounter for antenatal screening, unspecified: Secondary | ICD-10-CM

## 2012-07-03 DIAGNOSIS — O368390 Maternal care for abnormalities of the fetal heart rate or rhythm, unspecified trimester, not applicable or unspecified: Secondary | ICD-10-CM

## 2012-07-03 DIAGNOSIS — O0990 Supervision of high risk pregnancy, unspecified, unspecified trimester: Secondary | ICD-10-CM

## 2012-07-03 DIAGNOSIS — Z3480 Encounter for supervision of other normal pregnancy, unspecified trimester: Secondary | ICD-10-CM

## 2012-07-03 LAB — US OB DETAIL + 14 WK

## 2012-07-03 LAB — POCT URINALYSIS DIPSTICK
Bilirubin, UA: NEGATIVE
Blood, UA: NEGATIVE
Leukocytes, UA: NEGATIVE
Nitrite, UA: NEGATIVE
Protein, UA: NEGATIVE
Urobilinogen, UA: NEGATIVE
pH, UA: 8

## 2012-07-03 MED ORDER — OB COMPLETE PETITE 35-5-1-200 MG PO CAPS: 1.0000 | ORAL_CAPSULE | Freq: Every day | ORAL | Status: DC

## 2012-07-03 NOTE — Progress Notes (Signed)
Pulse-83 Pt c/o "tingling" in left hand intermittent x 1 month.      Subjective:    Pamela Schaefer is being seen today for her first obstetrical visit.  This is not a planned pregnancy. She is at [redacted]w[redacted]d gestation. Her obstetrical history is significant for pregnancy induced hypertension. Relationship with FOB: significant other, not living together. Patient does not intend to breast feed. Pregnancy history fully reviewed.  Menstrual History: OB History   Grav Para Term Preterm Abortions TAB SAB Ect Mult Living   4 2 2  0 1 0 1 0 0 2      Menarche age: 50  Patient's last menstrual period was 03/08/2012.    The following portions of the patient's history were reviewed and updated as appropriate: allergies, current medications, past family history, past medical history, past social history, past surgical history and problem list.  Review of Systems Pertinent items are noted in HPI.    Objective:    No exam performed today, Already done..    Assessment:    Pregnancy at [redacted]w[redacted]d weeks   Unsure LMP.  Fetal cardiac arrhythmia.  Probable conduction block.   Plan:   Ultrasound ordered for dating.  Initial labs drawn. Prenatal vitamins. Problem list reviewed and updated. AFP3 discussed: requested. Role of ultrasound in pregnancy discussed; fetal survey: requested. Amniocentesis discussed: not indicated. Follow up in 4 weeks. 50% of 20 min visit spent on counseling and coordination of care.  Referred to MFM.

## 2012-07-04 ENCOUNTER — Encounter: Payer: Self-pay | Admitting: Obstetrics

## 2012-07-04 ENCOUNTER — Encounter (HOSPITAL_COMMUNITY): Payer: Self-pay | Admitting: Obstetrics

## 2012-07-04 LAB — US OB DETAIL + 14 WK

## 2012-07-04 LAB — HIV ANTIBODY (ROUTINE TESTING W REFLEX): HIV: NONREACTIVE

## 2012-07-04 LAB — URINE CULTURE: Organism ID, Bacteria: NO GROWTH

## 2012-07-05 LAB — AFP, QUAD SCREEN
AFP: 34.7 IU/mL
Age Alone: 1:1170 {titer}
Down Syndrome Scr Risk Est: 1:1280 {titer}
HCG, Total: 22569 m[IU]/mL
INH: 277.4 pg/mL
MoM for INH: 1.53
MoM for hCG: 1.05
Open Spina bifida: NEGATIVE
Osb Risk: 1:48000 {titer}
uE3 Mom: 0.82

## 2012-07-05 LAB — HEMOGLOBINOPATHY EVALUATION
Hgb A2 Quant: 3 % (ref 2.2–3.2)
Hgb F Quant: 0.2 % (ref 0.0–2.0)
Hgb S Quant: 0 %

## 2012-07-05 LAB — OBSTETRIC PANEL
Basophils Absolute: 0 10*3/uL (ref 0.0–0.1)
Basophils Relative: 0 % (ref 0–1)
Eosinophils Relative: 1 % (ref 0–5)
Lymphocytes Relative: 20 % (ref 12–46)
Neutro Abs: 6.8 10*3/uL (ref 1.7–7.7)
Platelets: 283 10*3/uL (ref 150–400)
RDW: 15.4 % (ref 11.5–15.5)
Rubella: 6.68 Index — ABNORMAL HIGH (ref <0.90)
WBC: 8.9 10*3/uL (ref 4.0–10.5)

## 2012-07-09 ENCOUNTER — Other Ambulatory Visit: Payer: Self-pay | Admitting: Obstetrics

## 2012-07-09 ENCOUNTER — Encounter: Payer: Self-pay | Admitting: Obstetrics

## 2012-07-09 DIAGNOSIS — O358XX1 Maternal care for other (suspected) fetal abnormality and damage, fetus 1: Secondary | ICD-10-CM

## 2012-07-10 ENCOUNTER — Ambulatory Visit (HOSPITAL_COMMUNITY)
Admission: RE | Admit: 2012-07-10 | Discharge: 2012-07-10 | Disposition: A | Discharge status: Home / Self Care | Payer: Medicaid Other | Source: Ambulatory Visit | Attending: Obstetrics | Admitting: Obstetrics

## 2012-07-10 ENCOUNTER — Encounter (HOSPITAL_COMMUNITY): Payer: Self-pay

## 2012-07-10 DIAGNOSIS — O368390 Maternal care for abnormalities of the fetal heart rate or rhythm, unspecified trimester, not applicable or unspecified: Secondary | ICD-10-CM | POA: Insufficient documentation

## 2012-07-10 DIAGNOSIS — O358XX1 Maternal care for other (suspected) fetal abnormality and damage, fetus 1: Secondary | ICD-10-CM

## 2012-07-10 DIAGNOSIS — Z363 Encounter for antenatal screening for malformations: Secondary | ICD-10-CM | POA: Insufficient documentation

## 2012-07-10 DIAGNOSIS — O09299 Supervision of pregnancy with other poor reproductive or obstetric history, unspecified trimester: Secondary | ICD-10-CM | POA: Insufficient documentation

## 2012-07-10 DIAGNOSIS — Z1389 Encounter for screening for other disorder: Secondary | ICD-10-CM | POA: Insufficient documentation

## 2012-07-10 DIAGNOSIS — O358XX0 Maternal care for other (suspected) fetal abnormality and damage, not applicable or unspecified: Secondary | ICD-10-CM | POA: Insufficient documentation

## 2012-07-11 ENCOUNTER — Encounter: Payer: Self-pay | Admitting: Obstetrics

## 2012-07-31 ENCOUNTER — Other Ambulatory Visit: Payer: Medicaid Other

## 2012-07-31 ENCOUNTER — Ambulatory Visit (INDEPENDENT_AMBULATORY_CARE_PROVIDER_SITE_OTHER): Payer: Medicaid Other | Admitting: Obstetrics

## 2012-07-31 ENCOUNTER — Encounter: Payer: Self-pay | Admitting: Obstetrics

## 2012-07-31 VITALS — BP 108/69 | Temp 97.0°F | Wt 147.0 lb

## 2012-07-31 DIAGNOSIS — K219 Gastro-esophageal reflux disease without esophagitis: Secondary | ICD-10-CM

## 2012-07-31 DIAGNOSIS — N760 Acute vaginitis: Secondary | ICD-10-CM

## 2012-07-31 DIAGNOSIS — Z3482 Encounter for supervision of other normal pregnancy, second trimester: Secondary | ICD-10-CM

## 2012-07-31 DIAGNOSIS — Z3480 Encounter for supervision of other normal pregnancy, unspecified trimester: Secondary | ICD-10-CM

## 2012-07-31 LAB — POCT URINALYSIS DIPSTICK
Bilirubin, UA: NEGATIVE
Leukocytes, UA: NEGATIVE
Nitrite, UA: NEGATIVE
pH, UA: 5

## 2012-07-31 MED ORDER — VITAFOL ULTRA 29-0.6-0.4-200 MG PO CAPS: 1.0000 | ORAL_CAPSULE | Freq: Every day | ORAL | Status: DC

## 2012-07-31 MED ORDER — OMEPRAZOLE 20 MG PO CPDR: 20.0000 mg | DELAYED_RELEASE_CAPSULE | Freq: Every day | ORAL | Status: DC

## 2012-07-31 NOTE — Progress Notes (Signed)
Pulse-79 Pt c/o change in vaginal discharge x 2 of 3 days. Pt c/o "stringy white" discharge and "green mucus" discharge x 1 day. Pt c/o heartburn x 4 weeks and worse at night.

## 2012-08-01 ENCOUNTER — Other Ambulatory Visit: Payer: Self-pay | Admitting: *Deleted

## 2012-08-01 DIAGNOSIS — B9689 Other specified bacterial agents as the cause of diseases classified elsewhere: Secondary | ICD-10-CM

## 2012-08-01 LAB — GC/CHLAMYDIA PROBE AMP: CT Probe RNA: NEGATIVE

## 2012-08-01 MED ORDER — METRONIDAZOLE 500 MG PO TABS: 500.0000 mg | ORAL_TABLET | Freq: Two times a day (BID) | ORAL | Status: AC

## 2012-08-01 NOTE — Progress Notes (Signed)
Pt aware of results and prescription sent to pt's pharmacy. Pt expressed understanding.   

## 2012-08-21 ENCOUNTER — Other Ambulatory Visit: Payer: Medicaid Other

## 2012-08-21 ENCOUNTER — Encounter: Payer: Self-pay | Admitting: Obstetrics

## 2012-08-21 ENCOUNTER — Ambulatory Visit (INDEPENDENT_AMBULATORY_CARE_PROVIDER_SITE_OTHER): Payer: Medicaid Other | Admitting: Obstetrics

## 2012-08-21 VITALS — BP 109/69 | Temp 98.5°F | Wt 154.4 lb

## 2012-08-21 DIAGNOSIS — Z3480 Encounter for supervision of other normal pregnancy, unspecified trimester: Secondary | ICD-10-CM

## 2012-08-21 DIAGNOSIS — Z3482 Encounter for supervision of other normal pregnancy, second trimester: Secondary | ICD-10-CM

## 2012-08-21 LAB — POCT URINALYSIS DIPSTICK
Bilirubin, UA: NEGATIVE
Blood, UA: NEGATIVE
Ketones, UA: NEGATIVE
Leukocytes, UA: NEGATIVE
Protein, UA: NEGATIVE
Spec Grav, UA: 1.01
pH, UA: 7

## 2012-08-21 NOTE — Progress Notes (Signed)
P 79 Patient states she has lower abdominal pressure and at times feels a slight tightening (2xd). Patient also states her heart races at night.

## 2012-08-22 LAB — GLUCOSE TOLERANCE, 2 HOURS W/ 1HR: Glucose, Fasting: 70 mg/dL (ref 70–99)

## 2012-08-22 LAB — CBC
HCT: 30.9 % — ABNORMAL LOW (ref 36.0–46.0)
MCH: 28.7 pg (ref 26.0–34.0)
MCHC: 32 g/dL (ref 30.0–36.0)
MCV: 89.6 fL (ref 78.0–100.0)
Platelets: 261 10*3/uL (ref 150–400)
RDW: 16.4 % — ABNORMAL HIGH (ref 11.5–15.5)
WBC: 8.1 10*3/uL (ref 4.0–10.5)

## 2012-09-05 ENCOUNTER — Encounter: Payer: Medicaid Other | Admitting: Obstetrics

## 2012-09-06 ENCOUNTER — Ambulatory Visit (INDEPENDENT_AMBULATORY_CARE_PROVIDER_SITE_OTHER): Payer: Medicaid Other | Admitting: Obstetrics

## 2012-09-06 VITALS — BP 100/60 | Temp 98.6°F | Wt 152.4 lb

## 2012-09-06 DIAGNOSIS — Z3482 Encounter for supervision of other normal pregnancy, second trimester: Secondary | ICD-10-CM

## 2012-09-06 DIAGNOSIS — Z3480 Encounter for supervision of other normal pregnancy, unspecified trimester: Secondary | ICD-10-CM

## 2012-09-06 LAB — POCT URINALYSIS DIPSTICK
Bilirubin, UA: NEGATIVE
Blood, UA: NEGATIVE
Glucose, UA: NEGATIVE
Ketones, UA: NEGATIVE
Nitrite, UA: NEGATIVE
pH, UA: 7

## 2012-09-06 NOTE — Progress Notes (Signed)
Pulse: 82

## 2012-09-07 ENCOUNTER — Encounter: Payer: Self-pay | Admitting: Obstetrics

## 2012-09-17 ENCOUNTER — Encounter: Payer: Medicaid Other | Admitting: Obstetrics

## 2012-10-02 ENCOUNTER — Encounter: Payer: Medicaid Other | Admitting: Obstetrics

## 2012-10-04 ENCOUNTER — Encounter: Payer: Medicaid Other | Admitting: Obstetrics

## 2012-10-23 ENCOUNTER — Inpatient Hospital Stay (HOSPITAL_COMMUNITY)
Admission: AD | Admit: 2012-10-23 | Discharge: 2012-10-23 | Disposition: A | Discharge status: Home / Self Care | Payer: Medicaid Other | Source: Ambulatory Visit | Attending: Obstetrics | Admitting: Obstetrics

## 2012-10-23 ENCOUNTER — Encounter (HOSPITAL_COMMUNITY): Payer: Self-pay | Admitting: *Deleted

## 2012-10-23 ENCOUNTER — Inpatient Hospital Stay (HOSPITAL_COMMUNITY): Payer: Medicaid Other

## 2012-10-23 DIAGNOSIS — N949 Unspecified condition associated with female genital organs and menstrual cycle: Secondary | ICD-10-CM | POA: Insufficient documentation

## 2012-10-23 DIAGNOSIS — O4700 False labor before 37 completed weeks of gestation, unspecified trimester: Secondary | ICD-10-CM | POA: Insufficient documentation

## 2012-10-23 DIAGNOSIS — O479 False labor, unspecified: Secondary | ICD-10-CM

## 2012-10-23 DIAGNOSIS — R109 Unspecified abdominal pain: Secondary | ICD-10-CM | POA: Insufficient documentation

## 2012-10-23 DIAGNOSIS — O368190 Decreased fetal movements, unspecified trimester, not applicable or unspecified: Secondary | ICD-10-CM | POA: Insufficient documentation

## 2012-10-23 LAB — URINALYSIS, ROUTINE W REFLEX MICROSCOPIC
Bilirubin Urine: NEGATIVE
Hgb urine dipstick: NEGATIVE
Nitrite: NEGATIVE
Specific Gravity, Urine: 1.015 (ref 1.005–1.030)
Urobilinogen, UA: 0.2 mg/dL (ref 0.0–1.0)
pH: 7 (ref 5.0–8.0)

## 2012-10-23 LAB — WET PREP, GENITAL: Clue Cells Wet Prep HPF POC: NONE SEEN

## 2012-10-23 LAB — FETAL FIBRONECTIN: Fetal Fibronectin: NEGATIVE

## 2012-10-23 MED ORDER — NIFEDIPINE ER OSMOTIC RELEASE 30 MG PO TB24: 30.0000 mg | ORAL_TABLET | Freq: Two times a day (BID) | ORAL | Status: DC

## 2012-10-23 MED ORDER — NIFEDIPINE 10 MG PO CAPS
10.0000 mg | ORAL_CAPSULE | Freq: Once | ORAL | Status: AC
  Administered 2012-10-23: 10 mg via ORAL
  Filled 2012-10-23: qty 1

## 2012-10-23 MED ORDER — ONDANSETRON 4 MG PO TBDP
4.0000 mg | ORAL_TABLET | Freq: Once | ORAL | Status: AC
  Administered 2012-10-23: 4 mg via ORAL
  Filled 2012-10-23: qty 1

## 2012-10-23 NOTE — MAU Note (Signed)
Pelvic & groin pain, abdominal, back pain since yesterday, denies bleeding or LOF.

## 2012-10-23 NOTE — MAU Provider Note (Signed)
History     CSN: [id number]  Arrival date and time: 10/23/12 1110   First Provider Initiated Contact with Patient 10/23/12 1206      Chief Complaint  Patient presents with  . Pelvic Pain   HPI  Ms. Pamela Schaefer is a 22 y.o. female [phone] at [redacted]w[redacted]d who presents with pelvic pain that started yesterday. Started out as cramping that radiated to her back. Back pain is not a new finding; she has had back pain throughout her pregnancy. The pelvic pain is what brought her to the emergency room; she attempted to put her pants on the morning and was unable to lift her leg due to the pain. The pain is worsen when the patient walks or changes positions.  She reports decreased fetal movement, denies LOF, vaginal bleeding, vaginal itching/burning, urinary symptoms, h/a, dizziness, n/v, or fever/chills.  Last intercourse was "a while ago".    OB History   Grav Para Term Preterm Abortions TAB SAB Ect Mult Living   4 2 2  0 1 0 1 0 0 2      Past Medical History  Diagnosis Date  . No pertinent past medical history   . Pregnancy induced hypertension     Past Surgical History  Procedure Laterality Date  . Clavicle surgery      Family History  Problem Relation Age of Onset  . Stroke Maternal Grandmother     History  Substance Use Topics  . Smoking status: Current Some Day Smoker -- 0.10 packs/day    Types: Cigarettes  . Smokeless tobacco: Never Used     Comment: quit over year ago  . Alcohol Use: No    Allergies:  Allergies  Allergen Reactions  . Penicillins Itching    Prescriptions prior to admission  Medication Sig Dispense Refill  . acetaminophen (TYLENOL) 500 MG tablet Take 1,000 mg by mouth every 6 (six) hours as needed for pain.      . calcium carbonate (TUMS - DOSED IN MG ELEMENTAL CALCIUM) 500 MG chewable tablet Chew 2 tablets by mouth daily.       Results for orders placed during the hospital encounter of 10/23/12 (from the past 24 hour(s))  URINALYSIS, ROUTINE  W REFLEX MICROSCOPIC     Status: None   Collection Time    10/23/12 11:30 AM      Result Value Range   Color, Urine YELLOW  YELLOW   APPearance CLEAR  CLEAR   Specific Gravity, Urine 1.015  1.005 - 1.030   pH 7.0  5.0 - 8.0   Glucose, UA NEGATIVE  NEGATIVE mg/dL   Hgb urine dipstick NEGATIVE  NEGATIVE   Bilirubin Urine NEGATIVE  NEGATIVE   Ketones, ur NEGATIVE  NEGATIVE mg/dL   Protein, ur NEGATIVE  NEGATIVE mg/dL   Urobilinogen, UA 0.2  0.0 - 1.0 mg/dL   Nitrite NEGATIVE  NEGATIVE   Leukocytes, UA NEGATIVE  NEGATIVE  WET PREP, GENITAL     Status: Abnormal   Collection Time    10/23/12 12:40 PM      Result Value Range   Yeast Wet Prep HPF POC NONE SEEN  NONE SEEN   Trich, Wet Prep NONE SEEN  NONE SEEN   Clue Cells Wet Prep HPF POC NONE SEEN  NONE SEEN   WBC, Wet Prep HPF POC MANY (*) NONE SEEN  FETAL FIBRONECTIN     Status: None   Collection Time    10/23/12 12:40 PM      Result  Value Range   Fetal Fibronectin NEGATIVE  NEGATIVE   Review of Systems  Constitutional: Negative for fever and chills.  Gastrointestinal: Negative for nausea, vomiting, diarrhea and constipation.  Genitourinary: Negative for dysuria, urgency, frequency and hematuria.       No vaginal discharge. No vaginal bleeding. No dysuria.   Neurological: Negative for headaches.   Physical Exam   Blood pressure 96/60, pulse 93, temperature 98.1 F (36.7 C), temperature source Oral, resp. rate 18, height 5\' 3"  (1.6 m), weight 159 lb 12.8 oz (72.485 kg), last menstrual period 03/08/2012.  Physical Exam  Constitutional: She is oriented to person, place, and time. She appears well-developed and well-nourished. No distress.  HENT:  Head: Normocephalic.  Neck: Neck supple.  GI: Soft. She exhibits no distension. There is no tenderness. There is no rebound.  Genitourinary: Vaginal discharge found.  Speculum exam: Vagina - Moderate amount of creamy discharge, no odor Cervix - No contact bleeding, ectropion  noted GC/Chlam, wet prep done, fetal fibronectin collected  Chaperone present for exam.   Neurological: She is alert and oriented to person, place, and time.  Skin: Skin is warm and dry. She is not diaphoretic.    Fetal Tracing: Baseline: 135 bpm  Variability: moderate  Accelerations: 15x15 Decelerations: variables   Toco: Irregular pattern   Dilation: External os 1 cm, internal os closed Effacement: Thick, soft Cervical position: Posterior  Station: -3 Presentation: indeterminate  Exam by: Venia Carbon FNP  1400 Cervical exam unchanged from previous exam.   MAU Course  Procedures None  MDM UA Wet prep GC/Chlamydia Fetal fibronectin- negative  Change in fetal baseline, patient reports decreased fetal movement.  Procardia 10 mg PO 2nd dose of procardia given 10 mg PO  BPP 8/8  Consulted with Dr. Clearance Coots at 1455; ok to send patient home with procardia.   Assessment and Plan  A: Round ligament pain Braxton hicks contractions BPP 8/8  P: Discharge home RX: procardia 30 XL BID Follow up with Dr. Thomes Lolling office as scheduled Preterm labor precautions discussed Return to MAU for worsening symptoms   Dazha Kempa IRENE FNP-C 10/23/2012, 7:46 PM

## 2012-10-23 NOTE — Progress Notes (Signed)
Written and verbal d/c instructions given and understanding voiced. Discussed using stool softners/miralax for constipation. Also discussed use of Procardia XL. Understanding voiced.

## 2012-10-23 NOTE — Progress Notes (Signed)
To us via wc.

## 2012-10-24 LAB — GC/CHLAMYDIA PROBE AMP
CT Probe RNA: NEGATIVE
GC Probe RNA: NEGATIVE

## 2012-10-30 LAB — OB RESULTS CONSOLE GC/CHLAMYDIA: Gonorrhea: NEGATIVE

## 2012-10-30 LAB — OB RESULTS CONSOLE GBS: GBS: POSITIVE

## 2012-11-15 ENCOUNTER — Encounter: Payer: Medicaid Other | Admitting: Obstetrics

## 2012-11-15 ENCOUNTER — Ambulatory Visit (INDEPENDENT_AMBULATORY_CARE_PROVIDER_SITE_OTHER): Payer: Medicaid Other | Admitting: Obstetrics

## 2012-11-15 VITALS — BP 99/63 | Temp 98.3°F | Wt 163.0 lb

## 2012-11-15 DIAGNOSIS — Z3483 Encounter for supervision of other normal pregnancy, third trimester: Secondary | ICD-10-CM

## 2012-11-15 DIAGNOSIS — Z3480 Encounter for supervision of other normal pregnancy, unspecified trimester: Secondary | ICD-10-CM

## 2012-11-15 LAB — POCT URINALYSIS DIPSTICK
Blood, UA: NEGATIVE
Leukocytes, UA: NEGATIVE
Nitrite, UA: NEGATIVE
Protein, UA: NEGATIVE
Urobilinogen, UA: NEGATIVE
pH, UA: 7

## 2012-11-15 NOTE — Progress Notes (Signed)
HR - 78 Pt in office for routine OB visit, Pt complains of abd pain and pressure, states she is having contractions

## 2012-11-23 ENCOUNTER — Encounter (HOSPITAL_COMMUNITY): Payer: Self-pay | Admitting: *Deleted

## 2012-11-23 ENCOUNTER — Inpatient Hospital Stay (HOSPITAL_COMMUNITY)
Admission: AD | Admit: 2012-11-23 | Discharge: 2012-11-23 | Disposition: A | Discharge status: Home / Self Care | Payer: Medicaid Other | Source: Ambulatory Visit | Attending: Obstetrics & Gynecology | Admitting: Obstetrics & Gynecology

## 2012-11-23 DIAGNOSIS — O368190 Decreased fetal movements, unspecified trimester, not applicable or unspecified: Secondary | ICD-10-CM | POA: Insufficient documentation

## 2012-11-23 DIAGNOSIS — O4290 Premature rupture of membranes, unspecified as to length of time between rupture and onset of labor, unspecified weeks of gestation: Secondary | ICD-10-CM

## 2012-11-23 DIAGNOSIS — O99891 Other specified diseases and conditions complicating pregnancy: Secondary | ICD-10-CM | POA: Insufficient documentation

## 2012-11-23 DIAGNOSIS — O4700 False labor before 37 completed weeks of gestation, unspecified trimester: Secondary | ICD-10-CM | POA: Insufficient documentation

## 2012-11-23 NOTE — MAU Provider Note (Signed)
Asked to do speculum exam for rule out rupture of membranes  Had one small gush this am in shower.  No contractions  FHR stable and reactive  No pooling or ferning

## 2012-11-23 NOTE — MAU Note (Addendum)
Gush of fluid while in shower and continue to leak, UC started  30 after ? SROM, leaking started 0600, pad x 2 since then, noted decrease FM, no bleeding, irregular UC

## 2012-11-23 NOTE — MAU Note (Signed)
First gush at 0600, clear with white stuff in it. Has continued to leak. Is contracting. No bleeding.

## 2012-11-26 ENCOUNTER — Encounter: Payer: Medicaid Other | Admitting: Obstetrics

## 2012-11-27 ENCOUNTER — Encounter: Payer: Medicaid Other | Admitting: Obstetrics

## 2012-11-28 ENCOUNTER — Ambulatory Visit (INDEPENDENT_AMBULATORY_CARE_PROVIDER_SITE_OTHER): Payer: Medicaid Other | Admitting: Advanced Practice Midwife

## 2012-11-28 VITALS — BP 90/60 | Temp 97.5°F | Wt 170.0 lb

## 2012-11-28 DIAGNOSIS — Z3480 Encounter for supervision of other normal pregnancy, unspecified trimester: Secondary | ICD-10-CM

## 2012-11-28 DIAGNOSIS — Z3483 Encounter for supervision of other normal pregnancy, third trimester: Secondary | ICD-10-CM

## 2012-11-28 LAB — POCT URINALYSIS DIPSTICK
Blood, UA: NEGATIVE
Protein, UA: NEGATIVE
Spec Grav, UA: 1.015
Urobilinogen, UA: NEGATIVE
pH, UA: 6.5

## 2012-11-28 NOTE — Progress Notes (Signed)
Routine Obstetrical Visit  Subjective:    SEMONE Schaefer is being seen today for her routine obstetrical visit. She is at [redacted]w[redacted]d gestation.   Patient reports no complaints.  Declines discussing birth control.  Ready for delivery.   Objective:     BP 90/60  Temp(Src) 97.5 F (36.4 C)  Wt 170 lb (77.111 kg)  LMP 03/08/2012 Physical Exam  Exam   FHR 140, FH 38 VE 2/thick/posterior Vertex Assessment:    Pregnancy: [id number] Patient Active Problem List   Diagnosis Date Noted  . GERD without esophagitis 07/31/2012  . Vaginitis and vulvovaginitis, unspecified 07/31/2012  . Abnormality in fetal heart rate/rhythm, antepartum condition or complication 07/03/2012  . Active labor 04/25/2011  . Normal delivery 04/25/2011       Plan:     Prenatal vitamins. Problem list reviewed and updated.  GBS today. Follow up in 1 weeks. 80% of 20 min visit spent on counseling and coordination of care.     Pamela Schaefer 11/28/2012

## 2012-11-28 NOTE — Progress Notes (Signed)
HR - 73 Pt in office today for routine OB visit, reports abd cramping, irregular contractions, low back pain, thinks she may have lost mucous plug,

## 2012-12-03 ENCOUNTER — Encounter: Payer: Self-pay | Admitting: Advanced Practice Midwife

## 2012-12-03 DIAGNOSIS — O99820 Streptococcus B carrier state complicating pregnancy: Secondary | ICD-10-CM | POA: Insufficient documentation

## 2012-12-05 ENCOUNTER — Encounter: Payer: Medicaid Other | Admitting: Obstetrics

## 2012-12-06 ENCOUNTER — Encounter: Payer: Medicaid Other | Admitting: Obstetrics

## 2012-12-07 ENCOUNTER — Inpatient Hospital Stay (HOSPITAL_COMMUNITY)
Admission: AD | Admit: 2012-12-07 | Discharge: 2012-12-10 | DRG: 775 | Disposition: A | Discharge status: Home / Self Care | Payer: Medicaid Other | Source: Ambulatory Visit | Attending: Obstetrics | Admitting: Obstetrics

## 2012-12-07 ENCOUNTER — Encounter (HOSPITAL_COMMUNITY): Payer: Self-pay | Admitting: *Deleted

## 2012-12-07 DIAGNOSIS — Z22330 Carrier of Group B streptococcus: Secondary | ICD-10-CM

## 2012-12-07 DIAGNOSIS — O99892 Other specified diseases and conditions complicating childbirth: Principal | ICD-10-CM | POA: Diagnosis present

## 2012-12-07 DIAGNOSIS — IMO0001 Reserved for inherently not codable concepts without codable children: Secondary | ICD-10-CM

## 2012-12-07 DIAGNOSIS — D649 Anemia, unspecified: Secondary | ICD-10-CM | POA: Diagnosis present

## 2012-12-07 DIAGNOSIS — O9902 Anemia complicating childbirth: Secondary | ICD-10-CM | POA: Diagnosis present

## 2012-12-07 LAB — CBC
HCT: 29.4 % — ABNORMAL LOW (ref 36.0–46.0)
Hemoglobin: 9.7 g/dL — ABNORMAL LOW (ref 12.0–15.0)
MCH: 29 pg (ref 26.0–34.0)
Platelets: 245 10*3/uL (ref 150–400)
RBC: 3.35 MIL/uL — ABNORMAL LOW (ref 3.87–5.11)
RDW: 14.8 % (ref 11.5–15.5)
WBC: 8.6 10*3/uL (ref 4.0–10.5)

## 2012-12-07 LAB — URINE MICROSCOPIC-ADD ON: WBC, UA: NONE SEEN WBC/hpf (ref <3)

## 2012-12-07 LAB — URINALYSIS, ROUTINE W REFLEX MICROSCOPIC
Bilirubin Urine: NEGATIVE
Glucose, UA: NEGATIVE mg/dL
Ketones, ur: NEGATIVE mg/dL
Leukocytes, UA: NEGATIVE
Protein, ur: NEGATIVE mg/dL
pH: 7 (ref 5.0–8.0)

## 2012-12-07 LAB — OB RESULTS CONSOLE RPR: RPR: NONREACTIVE

## 2012-12-07 MED ORDER — TERBUTALINE SULFATE 1 MG/ML IJ SOLN: 0.2500 mg | Freq: Once | INTRAMUSCULAR | Status: AC | PRN

## 2012-12-07 MED ORDER — EPHEDRINE 5 MG/ML INJ
10.0000 mg | INTRAVENOUS | Status: DC | PRN
  Filled 2012-12-07: qty 2
  Filled 2012-12-07: qty 4

## 2012-12-07 MED ORDER — PHENYLEPHRINE 40 MCG/ML (10ML) SYRINGE FOR IV PUSH (FOR BLOOD PRESSURE SUPPORT)
80.0000 ug | PREFILLED_SYRINGE | INTRAVENOUS | Status: DC | PRN
  Filled 2012-12-07: qty 2

## 2012-12-07 MED ORDER — FLEET ENEMA 7-19 GM/118ML RE ENEM: 1.0000 | ENEMA | RECTAL | Status: DC | PRN

## 2012-12-07 MED ORDER — OXYTOCIN 40 UNITS IN LACTATED RINGERS INFUSION - SIMPLE MED
62.5000 mL/h | INTRAVENOUS | Status: DC
  Administered 2012-12-08: 62.5 mL/h via INTRAVENOUS

## 2012-12-07 MED ORDER — ACETAMINOPHEN 325 MG PO TABS: 650.0000 mg | ORAL_TABLET | ORAL | Status: DC | PRN

## 2012-12-07 MED ORDER — FENTANYL 2.5 MCG/ML BUPIVACAINE 1/10 % EPIDURAL INFUSION (WH - ANES)
14.0000 mL/h | INTRAMUSCULAR | Status: DC | PRN
  Administered 2012-12-08: 14 mL/h via EPIDURAL
  Filled 2012-12-07: qty 125

## 2012-12-07 MED ORDER — LACTATED RINGERS IV SOLN
INTRAVENOUS | Status: DC
  Administered 2012-12-07 (×2): via INTRAVENOUS
  Administered 2012-12-08: 125 mL/h via INTRAVENOUS
  Administered 2012-12-08: 10:00:00 via INTRAVENOUS

## 2012-12-07 MED ORDER — LACTATED RINGERS IV SOLN
500.0000 mL | INTRAVENOUS | Status: DC | PRN
  Administered 2012-12-08: 1000 mL via INTRAVENOUS

## 2012-12-07 MED ORDER — CLINDAMYCIN PHOSPHATE 900 MG/50ML IV SOLN
900.0000 mg | Freq: Once | INTRAVENOUS | Status: AC
  Administered 2012-12-07: 900 mg via INTRAVENOUS
  Filled 2012-12-07: qty 50

## 2012-12-07 MED ORDER — CITRIC ACID-SODIUM CITRATE 334-500 MG/5ML PO SOLN: 30.0000 mL | ORAL | Status: DC | PRN

## 2012-12-07 MED ORDER — OXYTOCIN 40 UNITS IN LACTATED RINGERS INFUSION - SIMPLE MED
1.0000 m[IU]/min | INTRAVENOUS | Status: DC
  Administered 2012-12-07: 1 m[IU]/min via INTRAVENOUS
  Filled 2012-12-07: qty 1000

## 2012-12-07 MED ORDER — ONDANSETRON HCL 4 MG/2ML IJ SOLN
4.0000 mg | Freq: Four times a day (QID) | INTRAMUSCULAR | Status: DC | PRN
  Administered 2012-12-08: 4 mg via INTRAVENOUS
  Filled 2012-12-07: qty 2

## 2012-12-07 MED ORDER — OXYCODONE-ACETAMINOPHEN 5-325 MG PO TABS: 1.0000 | ORAL_TABLET | ORAL | Status: DC | PRN

## 2012-12-07 MED ORDER — LACTATED RINGERS IV SOLN
500.0000 mL | Freq: Once | INTRAVENOUS | Status: AC
  Administered 2012-12-08: 500 mL via INTRAVENOUS

## 2012-12-07 MED ORDER — LIDOCAINE HCL (PF) 1 % IJ SOLN
30.0000 mL | INTRAMUSCULAR | Status: DC | PRN
  Filled 2012-12-07 (×2): qty 30

## 2012-12-07 MED ORDER — PHENYLEPHRINE 40 MCG/ML (10ML) SYRINGE FOR IV PUSH (FOR BLOOD PRESSURE SUPPORT)
80.0000 ug | PREFILLED_SYRINGE | INTRAVENOUS | Status: DC | PRN
  Filled 2012-12-07: qty 2
  Filled 2012-12-07: qty 10

## 2012-12-07 MED ORDER — OXYTOCIN BOLUS FROM INFUSION: 500.0000 mL | INTRAVENOUS | Status: DC

## 2012-12-07 MED ORDER — EPHEDRINE 5 MG/ML INJ
10.0000 mg | INTRAVENOUS | Status: DC | PRN
  Filled 2012-12-07: qty 2

## 2012-12-07 MED ORDER — IBUPROFEN 600 MG PO TABS
600.0000 mg | ORAL_TABLET | Freq: Four times a day (QID) | ORAL | Status: DC | PRN
  Administered 2012-12-08 – 2012-12-09 (×2): 600 mg via ORAL

## 2012-12-07 MED ORDER — BUTORPHANOL TARTRATE 1 MG/ML IJ SOLN
1.0000 mg | INTRAMUSCULAR | Status: DC | PRN
  Administered 2012-12-08: 1 mg via INTRAVENOUS
  Filled 2012-12-07: qty 1

## 2012-12-07 MED ORDER — DIPHENHYDRAMINE HCL 50 MG/ML IJ SOLN: 12.5000 mg | INTRAMUSCULAR | Status: DC | PRN

## 2012-12-07 NOTE — MAU Provider Note (Signed)
  History     CSN: [id number]  Arrival date and time: 12/07/12 1422   First Provider Initiated Contact with Patient 12/07/12 1505      Chief Complaint  Patient presents with  . Vaginal Bleeding   Vaginal Bleeding    Pamela Schaefer is a 22 y.o. [id number] at [redacted]w[redacted]d who presents today with vaginal bleeding. She states that the bleeding started around 1400. She is also having intermittent abdominal pain. She last had intercourse last night. She denies any LOF or complications with the pregnancy, and confirms fetal movement.   Past Medical History  Diagnosis Date  . No pertinent past medical history   . Pregnancy induced hypertension   . Infection     UTI    Past Surgical History  Procedure Laterality Date  . Clavicle surgery      Family History  Problem Relation Age of Onset  . Diabetes Maternal Grandmother   . Hypertension Maternal Grandmother   . Heart disease Maternal Grandfather   . Kidney disease Maternal Grandfather     failure    History  Substance Use Topics  . Smoking status: Former Smoker -- 0.10 packs/day    Types: Cigarettes  . Smokeless tobacco: Never Used     Comment: quit over year ago- 2013  . Alcohol Use: No    Allergies:  Allergies  Allergen Reactions  . Penicillins Itching    No prescriptions prior to admission    Review of Systems  Genitourinary: Positive for vaginal bleeding.   Physical Exam   Blood pressure 111/65, pulse 90, temperature 98.3 F (36.8 C), temperature source Oral, resp. rate 16, height 5\' 3"  (1.6 m), weight 77.111 kg (170 lb), last menstrual period 03/08/2012.  Physical Exam  Nursing note and vitals reviewed. Constitutional: She is oriented to person, place, and time. She appears well-developed and well-nourished. No distress.  Cardiovascular: Normal rate.   Respiratory: Effort normal.  GI: Soft. There is no tenderness.  Genitourinary:   External: no lesion Vagina: moderate amount of mucousy blood in the  vagina  Cervix: pink, smooth, 2-3/70/-2/BBOW Uterus:AGA, non-tender   Neurological: She is alert and oriented to person, place, and time.  Skin: Skin is warm and dry.  Psychiatric: She has a normal mood and affect.    MAU Course  Procedures  1600: Dr. Gaynell Face notified of patient status, Will continue to monitor.   Assessment and Plan  Admit to labor and delivery.   Tawnya Crook 12/07/2012, 3:13 PM

## 2012-12-07 NOTE — MAU Note (Signed)
Patient presents with complaint of vaginal bleeding X 1 hour. 

## 2012-12-08 ENCOUNTER — Inpatient Hospital Stay (HOSPITAL_COMMUNITY): Payer: Medicaid Other | Admitting: Anesthesiology

## 2012-12-08 ENCOUNTER — Encounter (HOSPITAL_COMMUNITY): Payer: Medicaid Other | Admitting: Anesthesiology

## 2012-12-08 ENCOUNTER — Encounter (HOSPITAL_COMMUNITY): Payer: Self-pay | Admitting: *Deleted

## 2012-12-08 LAB — RPR: RPR Ser Ql: NONREACTIVE

## 2012-12-08 MED ORDER — WITCH HAZEL-GLYCERIN EX PADS: 1.0000 "application " | MEDICATED_PAD | CUTANEOUS | Status: DC | PRN

## 2012-12-08 MED ORDER — LANOLIN HYDROUS EX OINT: TOPICAL_OINTMENT | CUTANEOUS | Status: DC | PRN

## 2012-12-08 MED ORDER — SIMETHICONE 80 MG PO CHEW: 80.0000 mg | CHEWABLE_TABLET | ORAL | Status: DC | PRN

## 2012-12-08 MED ORDER — TETANUS-DIPHTH-ACELL PERTUSSIS 5-2.5-18.5 LF-MCG/0.5 IM SUSP: 0.5000 mL | Freq: Once | INTRAMUSCULAR | Status: DC

## 2012-12-08 MED ORDER — DIPHENHYDRAMINE HCL 25 MG PO CAPS: 25.0000 mg | ORAL_CAPSULE | Freq: Four times a day (QID) | ORAL | Status: DC | PRN

## 2012-12-08 MED ORDER — ZOLPIDEM TARTRATE 5 MG PO TABS: 5.0000 mg | ORAL_TABLET | Freq: Every evening | ORAL | Status: DC | PRN

## 2012-12-08 MED ORDER — OXYCODONE-ACETAMINOPHEN 5-325 MG PO TABS
1.0000 | ORAL_TABLET | ORAL | Status: DC | PRN
  Administered 2012-12-08: 1 via ORAL
  Administered 2012-12-08: 2 via ORAL
  Administered 2012-12-09 – 2012-12-10 (×4): 1 via ORAL
  Filled 2012-12-08: qty 1
  Filled 2012-12-08: qty 2
  Filled 2012-12-08 (×4): qty 1

## 2012-12-08 MED ORDER — CLINDAMYCIN PHOSPHATE 900 MG/50ML IV SOLN
900.0000 mg | Freq: Three times a day (TID) | INTRAVENOUS | Status: DC
  Administered 2012-12-08: 900 mg via INTRAVENOUS
  Filled 2012-12-08 (×2): qty 50

## 2012-12-08 MED ORDER — BENZOCAINE-MENTHOL 20-0.5 % EX AERO
1.0000 "application " | INHALATION_SPRAY | CUTANEOUS | Status: DC | PRN
  Administered 2012-12-09: 1 via TOPICAL
  Filled 2012-12-08: qty 56

## 2012-12-08 MED ORDER — PRENATAL MULTIVITAMIN CH
1.0000 | ORAL_TABLET | Freq: Every day | ORAL | Status: DC
  Administered 2012-12-09: 1 via ORAL
  Filled 2012-12-08: qty 1

## 2012-12-08 MED ORDER — DIBUCAINE 1 % RE OINT: 1.0000 "application " | TOPICAL_OINTMENT | RECTAL | Status: DC | PRN

## 2012-12-08 MED ORDER — ONDANSETRON HCL 4 MG PO TABS: 4.0000 mg | ORAL_TABLET | ORAL | Status: DC | PRN

## 2012-12-08 MED ORDER — IBUPROFEN 600 MG PO TABS
600.0000 mg | ORAL_TABLET | Freq: Four times a day (QID) | ORAL | Status: DC
  Administered 2012-12-08 – 2012-12-10 (×6): 600 mg via ORAL
  Filled 2012-12-08 (×8): qty 1

## 2012-12-08 MED ORDER — FERROUS SULFATE 325 (65 FE) MG PO TABS
325.0000 mg | ORAL_TABLET | Freq: Two times a day (BID) | ORAL | Status: DC
  Administered 2012-12-08 – 2012-12-10 (×4): 325 mg via ORAL
  Filled 2012-12-08 (×4): qty 1

## 2012-12-08 MED ORDER — SENNOSIDES-DOCUSATE SODIUM 8.6-50 MG PO TABS
2.0000 | ORAL_TABLET | ORAL | Status: DC
  Administered 2012-12-08 – 2012-12-09 (×2): 2 via ORAL
  Filled 2012-12-08 (×2): qty 2

## 2012-12-08 MED ORDER — ONDANSETRON HCL 4 MG/2ML IJ SOLN
4.0000 mg | INTRAMUSCULAR | Status: DC | PRN
Start: 2012-12-08 — End: 2012-12-10

## 2012-12-08 MED ORDER — LIDOCAINE HCL (PF) 1 % IJ SOLN
INTRAMUSCULAR | Status: DC | PRN
  Administered 2012-12-08 (×4): 4 mL

## 2012-12-08 NOTE — Progress Notes (Signed)
Patient called nurse to room asking for epidural patient stated she was not feeling like she was in much pain and wanted to wait to get epidural but was worried she would not get epidural in time; offered patient pain medication she agreed; patient stated it helped pain; 5 minutes later patient calls nurse back to room; she vomited on the floor at this time and stated she "didnt feel right"; upon asking patient what she is feeling she states her head felt "heavy" and she felt very woozy and was beginning to see things and it would not let her sleep; continued bolus that was intitally started for epidural at this time; patient denies SOB.

## 2012-12-08 NOTE — H&P (Signed)
This is Dr. Francoise Ceo dictating the history and physical on blank blank she's a 22 year old gravida 4 para 212 at 40 weeks positive GBS patient got clindamycin and she was admitted in labor cervix 4 cm membranes intact she's now on low-dose Pitocin and she has her epidural she is 5 cm Past medical history negative Past surgical history negative Social history negative System review negative Physical exam well-developed female in labor HEENT negative Lungs clear to P&A Breasts negative Abdomen term Pelvic as described above Extremities negative and

## 2012-12-08 NOTE — Anesthesia Preprocedure Evaluation (Addendum)
Anesthesia Evaluation  Patient identified by MRN, date of birth, ID band Patient awake    Reviewed: Allergy & Precautions, H&P , NPO status , Patient's Chart, lab work & pertinent test results, reviewed documented beta blocker date and time   History of Anesthesia Complications Negative for: history of anesthetic complications  Airway Mallampati: III TM Distance: >3 FB Neck ROM: full    Dental  (+) Teeth Intact   Pulmonary neg pulmonary ROS, former smoker,  breath sounds clear to auscultation        Cardiovascular hypertension (h/o PIH in previous pregnancy), Rhythm:regular Rate:Normal  Hypotensive - BP 80s-90s/40s-50s   Neuro/Psych negative neurological ROS  negative psych ROS   GI/Hepatic Neg liver ROS, GERD-  ,  Endo/Other  negative endocrine ROS  Renal/GU negative Renal ROS     Musculoskeletal   Abdominal   Peds  Hematology  (+) anemia ,   Anesthesia Other Findings   Reproductive/Obstetrics (+) Pregnancy                          Anesthesia Physical Anesthesia Plan  ASA: II  Anesthesia Plan: Epidural   Post-op Pain Management:    Induction:   Airway Management Planned:   Additional Equipment:   Intra-op Plan:   Post-operative Plan:   Informed Consent: I have reviewed the patients History and Physical, chart, labs and discussed the procedure including the risks, benefits and alternatives for the proposed anesthesia with the patient or authorized representative who has indicated his/her understanding and acceptance.     Plan Discussed with:   Anesthesia Plan Comments:         Anesthesia Quick Evaluation

## 2012-12-08 NOTE — Anesthesia Procedure Notes (Addendum)
Epidural Patient location during procedure: OB Start time: 12/08/2012 6:45 AM  Staffing Performed by: anesthesiologist   Preanesthetic Checklist Completed: patient identified, site marked, surgical consent, pre-op evaluation, timeout performed, IV checked, risks and benefits discussed and monitors and equipment checked  Epidural Patient position: sitting Prep: site prepped and draped and DuraPrep Patient monitoring: continuous pulse ox and blood pressure Approach: midline Injection technique: LOR air  Needle:  Needle type: Tuohy  Needle gauge: 17 G Needle length: 9 cm and 9 Needle insertion depth: 5 cm cm Catheter type: closed end flexible Catheter size: 19 Gauge Catheter at skin depth: 10 cm Test dose: negative  Assessment Events: blood not aspirated, injection not painful, no injection resistance, negative IV test and no paresthesia  Additional Notes Discussed risk of headache, infection, bleeding, nerve injury and failed or incomplete block.  Patient voices understanding and wishes to proceed.  Epidural placed easily on first attempt.  No paresthesia.  Patient tolerated procedure well with no apparent complications.  Jasmine December, MDReason for block:procedure for pain

## 2012-12-09 LAB — CBC
HCT: 25.1 % — ABNORMAL LOW (ref 36.0–46.0)
Platelets: 202 10*3/uL (ref 150–400)
RDW: 14.8 % (ref 11.5–15.5)
WBC: 10 10*3/uL (ref 4.0–10.5)

## 2012-12-09 NOTE — Lactation Note (Signed)
This note was copied from the chart of Girl Lehman Prom. Lactation Consultation Note  Patient Name: Girl Perlita Forbush [id number] Date: 12/09/2012 Reason for consult: Initial assessment;Other (Comment) (charting for exclusion)   Maternal Data Formula Feeding for Exclusion: Yes Reason for exclusion: Mother's choice to formula feed on admision;Mother's choice to formula and breast feed on admission  Feeding    LATCH Score/Interventions                      Lactation Tools Discussed/Used     Consult Status Consult Status: Complete    Lynda Rainwater 12/09/2012, 3:14 PM

## 2012-12-09 NOTE — Progress Notes (Signed)
Patient ID: Pamela Schaefer, female   DOB: November 30, 1990, 22 y.o.   MRN: [id number] Postpartum day one Fundus firm Lochia moderate Legs negative Doing well

## 2012-12-10 MED ORDER — OXYCODONE-ACETAMINOPHEN 5-325 MG PO TABS: 1.0000 | ORAL_TABLET | ORAL | Status: DC | PRN

## 2012-12-10 MED ORDER — FUSION PLUS PO CAPS: 1.0000 | ORAL_CAPSULE | Freq: Every day | ORAL | Status: DC

## 2012-12-10 MED ORDER — IBUPROFEN 600 MG PO TABS: 600.0000 mg | ORAL_TABLET | Freq: Four times a day (QID) | ORAL | Status: DC | PRN

## 2012-12-10 NOTE — Progress Notes (Signed)
UR chart review completed.  

## 2012-12-10 NOTE — Anesthesia Postprocedure Evaluation (Signed)
Patient stable following vaginal delivery.  

## 2012-12-10 NOTE — Progress Notes (Signed)
Post Partum Day 2 Subjective: no complaints  Objective: Blood pressure 93/56, pulse 113, temperature 98.5 F (36.9 C), temperature source Oral, resp. rate 18, height 5\' 3"  (1.6 m), weight 170 lb (77.111 kg), last menstrual period 03/08/2012, SpO2 97.00%, unknown if currently breastfeeding.  Physical Exam:  General: alert and no distress Lochia: appropriate Uterine Fundus: firm Incision: healing well DVT Evaluation: No evidence of DVT seen on physical exam.   Recent Labs  12/07/12 1733 12/09/12 0547  HGB 9.7* 8.5*  HCT 29.4* 25.1*    Assessment/Plan: Discharge home.  Anemia.  Chronic.  Clinically stable.  Iron Rx.   LOS: 3 days   HARPER,CHARLES A 12/10/2012, 8:28 AM

## 2012-12-10 NOTE — Discharge Summary (Signed)
Obstetric Discharge Summary Reason for Admission: onset of labor Prenatal Procedures: ultrasound Intrapartum Procedures: spontaneous vaginal delivery Postpartum Procedures: none Complications-Operative and Postpartum: none Hemoglobin  Date Value Range Status  12/09/2012 8.5* 12.0 - 15.0 g/dL Final     HCT  Date Value Range Status  12/09/2012 25.1* 36.0 - 46.0 % Final    Physical Exam:  General: alert and no distress Lochia: appropriate Uterine Fundus: firm Incision: healing well DVT Evaluation: No evidence of DVT seen on physical exam.  Discharge Diagnoses: Term Pregnancy-delivered  Discharge Information: Date: 12/10/2012 Activity: pelvic rest Diet: routine Medications: PNV, Ibuprofen, Iron and Percocet Condition: stable Instructions: refer to practice specific booklet Discharge to: home Follow-up Information   Follow up with Antionette Char A, MD. Schedule an appointment as soon as possible for a visit in 1 week.   Specialty:  Obstetrics and Gynecology   Contact information:   [address] [place] [address] [phone]       Newborn Data: Live born female  Birth Weight: 7 lb 12.3 oz (3525 g) APGAR: 8, 9  Home with mother.  Ishia Tenorio A 12/10/2012, 8:31 AM

## 2012-12-18 ENCOUNTER — Ambulatory Visit: Payer: Medicaid Other | Admitting: Obstetrics

## 2012-12-24 ENCOUNTER — Encounter: Payer: Self-pay | Admitting: Obstetrics

## 2012-12-24 ENCOUNTER — Ambulatory Visit (INDEPENDENT_AMBULATORY_CARE_PROVIDER_SITE_OTHER): Payer: Medicaid Other | Admitting: Obstetrics

## 2012-12-24 DIAGNOSIS — Z3009 Encounter for other general counseling and advice on contraception: Secondary | ICD-10-CM

## 2012-12-24 MED ORDER — MEDROXYPROGESTERONE ACETATE 150 MG/ML IM SUSP: 150.0000 mg | INTRAMUSCULAR | Status: DC

## 2012-12-24 NOTE — Progress Notes (Signed)
Subjective:     Pamela Schaefer is a 22 y.o. female who presents for a postpartum visit. She is 2 weeks postpartum following a spontaneous vaginal delivery. I have fully reviewed the prenatal and intrapartum course. The delivery was at 40 gestational weeks. Outcome: spontaneous vaginal delivery. Anesthesia: epidural. Postpartum course has been Pt has been having back pain and stomach pain. Pt states that stomach pain gets unbearable.  Pt has decreased appetite as well.  Baby's course has been doing well. Baby is feeding by bottle - soothe. Bleeding thin lochia. Bowel function is normal. Bladder function is normal. Patient is not sexually active. Contraception method is none. Pt would like to discuss using depo.  Postpartum depression screening: positive (17).  The following portions of the patient's history were reviewed and updated as appropriate: allergies, current medications, past family history, past medical history, past social history, past surgical history and problem list.  Review of Systems Pertinent items are noted in HPI.    Objective:    BP 98/67  Pulse 60  Temp(Src) 98.2 F (36.8 C)  Wt 148 lb (67.132 kg)   PE:  Deferred Assessment:     Postpartum, 2 weeks..  Doing well  Plan:    1. Contraception: Depo-Provera injections 2. Continue PVV's 3. Follow up in: 4 weeks or as needed.

## 2013-01-03 NOTE — L&D Delivery Note (Signed)
Delivery Note After a 15 minute 2nd stage, at 8:57 PM a viable female was delivered via  (Presentation: LOP;  ).  APGAR: 9/9 ; weight pending .   Placenta status:intact with 3 vesselCord:  with the following complications:loose nuchal cord X 1, delivered through  Anesthesia: Epidural  Episiotomy: none Lacerations: nione Suture Repair: n/a Est. Blood Loss (mL): 100  Mom to postpartum.  Baby to Couplet care / Skin to Skin.  Delivery by Kathlen Brunswickliva Blue, PA-S under my direct supervision  Pamela Schaefer,Pamela Schaefer 10/17/2013, 9:15 PM

## 2013-01-21 ENCOUNTER — Ambulatory Visit: Payer: Medicaid Other | Admitting: Obstetrics

## 2013-01-23 ENCOUNTER — Ambulatory Visit: Payer: Medicaid Other | Admitting: Obstetrics

## 2013-01-24 ENCOUNTER — Encounter: Payer: Self-pay | Admitting: Obstetrics

## 2013-01-24 ENCOUNTER — Ambulatory Visit (INDEPENDENT_AMBULATORY_CARE_PROVIDER_SITE_OTHER): Payer: Medicaid Other | Admitting: Obstetrics

## 2013-01-24 DIAGNOSIS — Z3049 Encounter for surveillance of other contraceptives: Secondary | ICD-10-CM

## 2013-01-24 DIAGNOSIS — R52 Pain, unspecified: Secondary | ICD-10-CM

## 2013-01-24 MED ORDER — OXYCODONE-ACETAMINOPHEN 5-325 MG PO TABS: 1.0000 | ORAL_TABLET | ORAL | Status: DC | PRN

## 2013-01-24 NOTE — Progress Notes (Signed)
Subjective:     Pamela MessingShanelle C Schaefer is a 23 y.o. female who presents for a postpartum visit. She is 6 weeks postpartum following a spontaneous vaginal delivery. I have fully reviewed the prenatal and intrapartum course. The delivery was at 40 gestational weeks. Outcome: spontaneous vaginal delivery. Anesthesia: epidural. Postpartum course has been going well. Baby's course has been going well. Baby is feeding by bottle - Octavia HeirGerber Soothe. Bleeding red. Bowel function is normal. Bladder function is normal. Patient is not sexually active. Contraception method is none. Postpartum depression screening: positive-10. Pt states that she is still bleeding since delivery.  Pt states that there is not much change in color or amount.  Pt states that she is having back and shoulder pain.  Pt is also having daily headaches, tylenol and ibuprofen with little relief.  Pt states that she is having left ear pain.    The following portions of the patient's history were reviewed and updated as appropriate: allergies, current medications, past family history, past medical history, past social history, past surgical history and problem list.  Review of Systems Pertinent items are noted in HPI.   Objective:    BP 107/73  Pulse 66  Temp(Src) 98 F (36.7 C)  Wt 150 lb (68.04 kg)  Breastfeeding? No  General:  alert and no distress PE:      Breasts:  Negative      Abdomen:  Soft, NT.      Pelvic:  NEFG.  Uterus NSSC, NT.      Assessment:     Normal postpartum exam. Pap smear not done at today's visit.   Plan:    1. Contraception: Depo-Provera injections 2. Continue PNV's 3. Follow up in: 3 months or as needed.

## 2013-01-29 ENCOUNTER — Ambulatory Visit: Payer: Medicaid Other | Admitting: Obstetrics

## 2013-04-24 ENCOUNTER — Ambulatory Visit: Payer: Medicaid Other | Admitting: Obstetrics

## 2013-04-29 ENCOUNTER — Emergency Department (HOSPITAL_COMMUNITY)
Admission: EM | Admit: 2013-04-29 | Discharge: 2013-04-29 | Discharge status: Against Medical Advice | Payer: No Typology Code available for payment source | Attending: Emergency Medicine | Admitting: Emergency Medicine

## 2013-04-29 ENCOUNTER — Emergency Department (HOSPITAL_COMMUNITY): Payer: No Typology Code available for payment source

## 2013-04-29 ENCOUNTER — Encounter (HOSPITAL_COMMUNITY): Payer: Self-pay | Admitting: Emergency Medicine

## 2013-04-29 DIAGNOSIS — S46909A Unspecified injury of unspecified muscle, fascia and tendon at shoulder and upper arm level, unspecified arm, initial encounter: Secondary | ICD-10-CM | POA: Insufficient documentation

## 2013-04-29 DIAGNOSIS — O9989 Other specified diseases and conditions complicating pregnancy, childbirth and the puerperium: Secondary | ICD-10-CM | POA: Insufficient documentation

## 2013-04-29 DIAGNOSIS — Z87440 Personal history of urinary (tract) infections: Secondary | ICD-10-CM | POA: Insufficient documentation

## 2013-04-29 DIAGNOSIS — S161XXA Strain of muscle, fascia and tendon at neck level, initial encounter: Secondary | ICD-10-CM

## 2013-04-29 DIAGNOSIS — Z87891 Personal history of nicotine dependence: Secondary | ICD-10-CM | POA: Insufficient documentation

## 2013-04-29 DIAGNOSIS — M25512 Pain in left shoulder: Secondary | ICD-10-CM

## 2013-04-29 DIAGNOSIS — Z880 Allergy status to penicillin: Secondary | ICD-10-CM | POA: Insufficient documentation

## 2013-04-29 DIAGNOSIS — S139XXA Sprain of joints and ligaments of unspecified parts of neck, initial encounter: Secondary | ICD-10-CM | POA: Insufficient documentation

## 2013-04-29 DIAGNOSIS — Y92410 Unspecified street and highway as the place of occurrence of the external cause: Secondary | ICD-10-CM | POA: Insufficient documentation

## 2013-04-29 DIAGNOSIS — Y9389 Activity, other specified: Secondary | ICD-10-CM | POA: Insufficient documentation

## 2013-04-29 DIAGNOSIS — S4980XA Other specified injuries of shoulder and upper arm, unspecified arm, initial encounter: Secondary | ICD-10-CM | POA: Insufficient documentation

## 2013-04-29 MED ORDER — ACETAMINOPHEN 500 MG PO TABS
1000.0000 mg | ORAL_TABLET | Freq: Once | ORAL | Status: AC
  Administered 2013-04-29: 1000 mg via ORAL
  Filled 2013-04-29: qty 2

## 2013-04-29 NOTE — ED Notes (Signed)
[facility] For [facility].  Patient is alert and oriented x3.  V/S stable.  Patient only complaint of neck pain  That she currently rates 4 of 10.  Patient is ambulatory.  Patient is currently pregnant.  Unknown due date.
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
## 2013-04-29 NOTE — ED Notes (Signed)
Patient seen walking out with children and significant other. Patient did not state to anyone that she was leaving.

## 2013-04-29 NOTE — ED Notes (Signed)
Patient removed C collar

## 2013-05-08 ENCOUNTER — Ambulatory Visit: Payer: Medicaid Other | Admitting: Obstetrics

## 2013-06-24 IMAGING — CR DG CHEST 2V
2 series · 2 of 2 positions shown · non-contrast
Comparison: 02/11/2009.

CLINICAL DATA: Chest pain since this morning.

CHEST - 2 VIEW

[w chest pa]
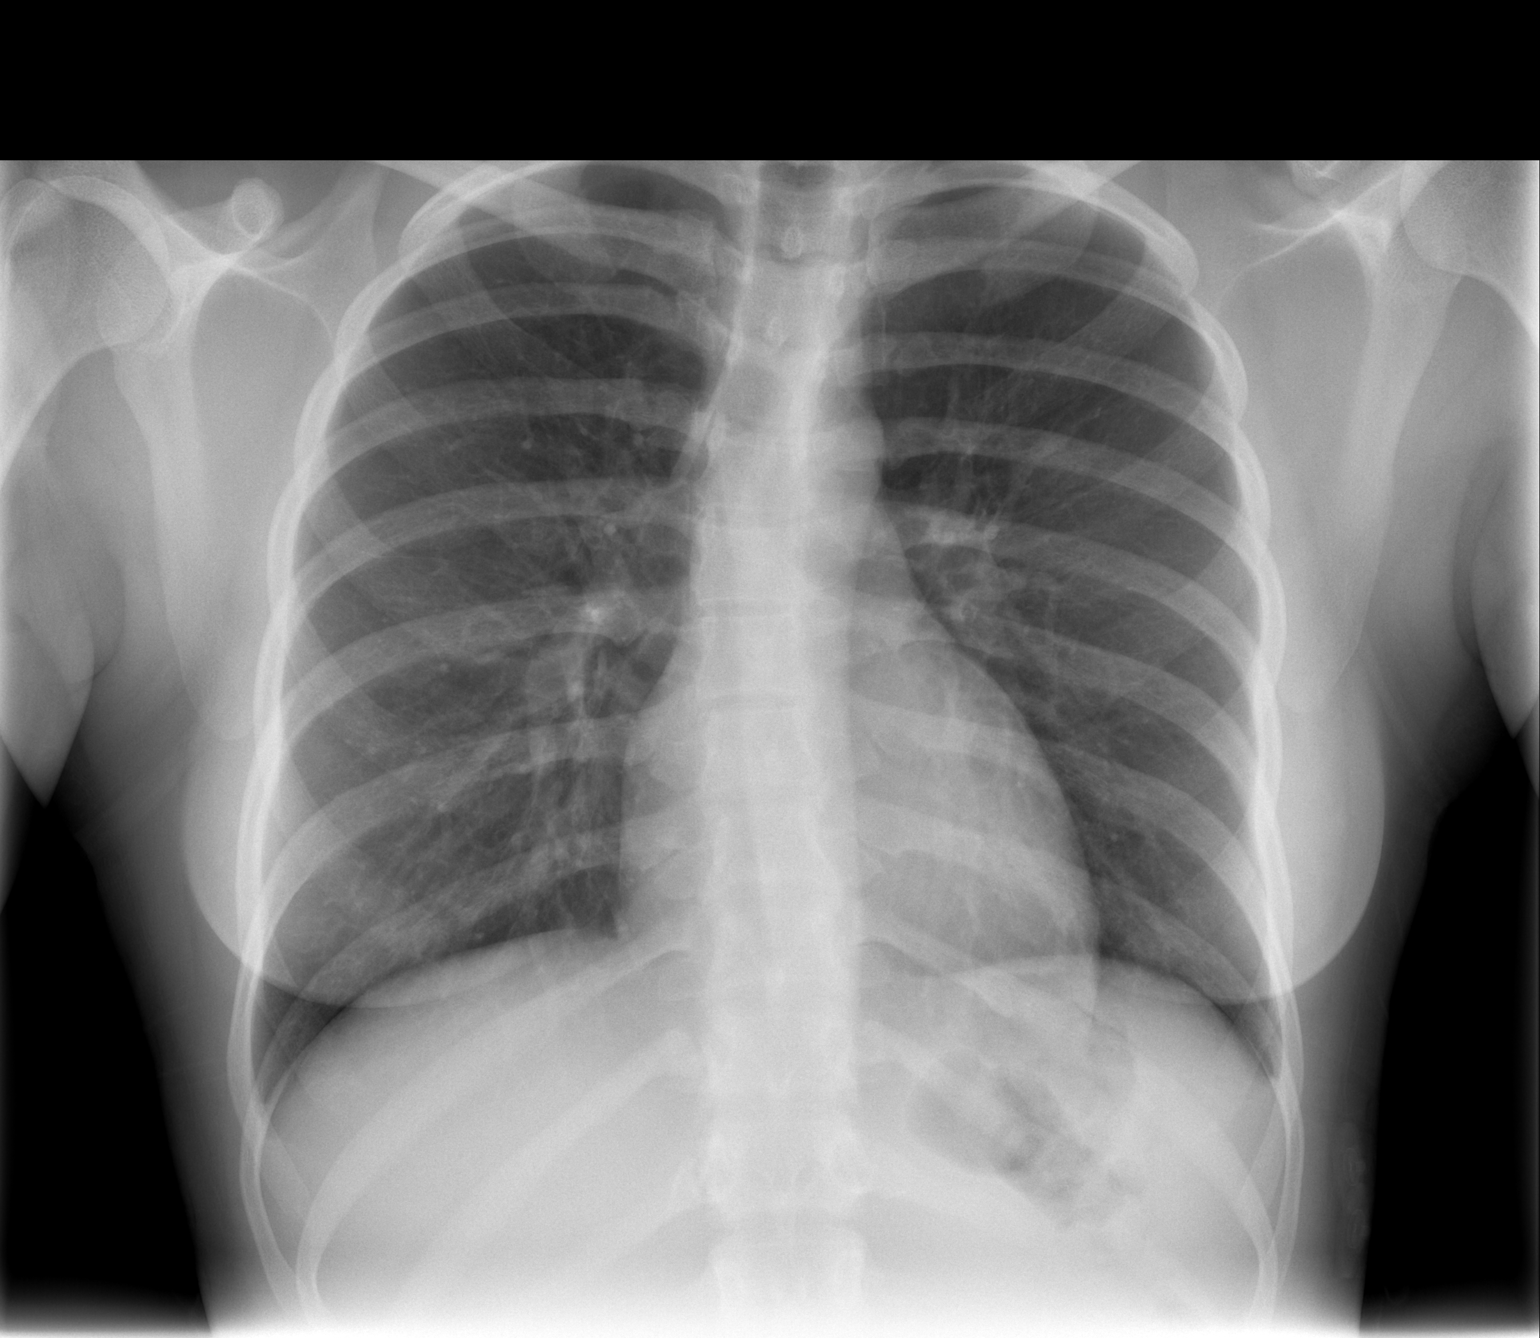

[w chest lat]
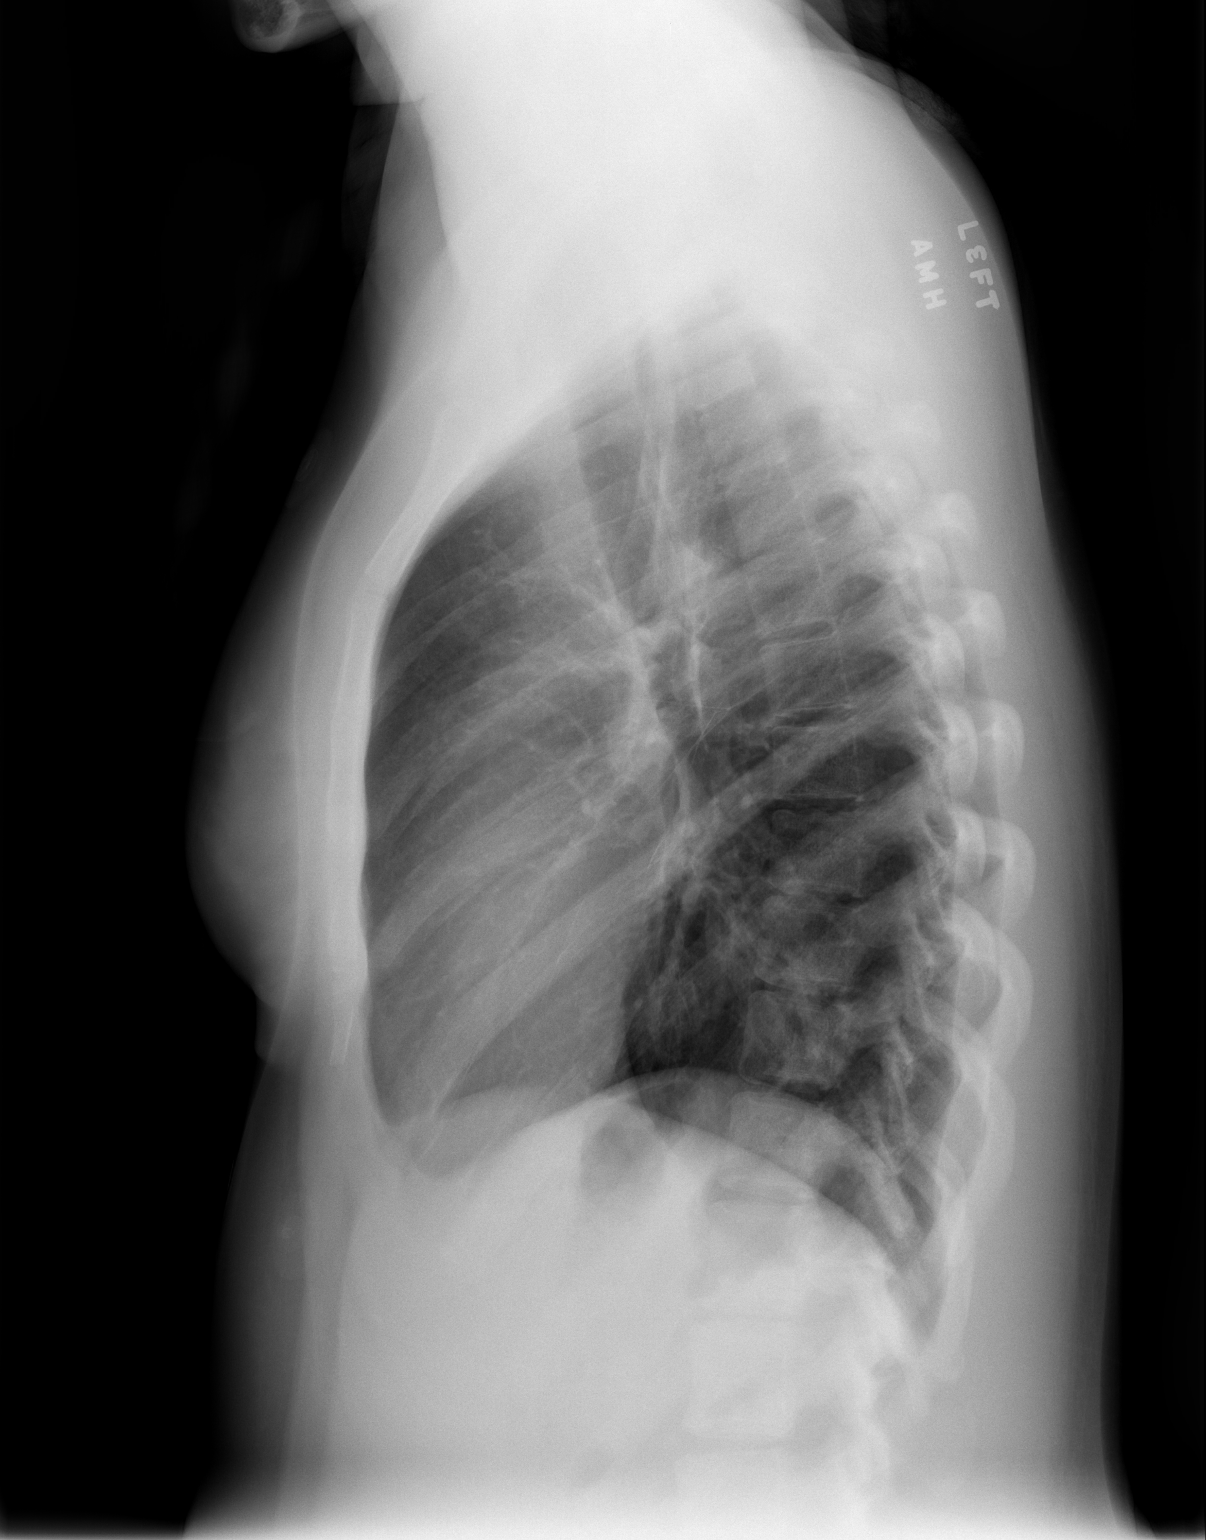

[2 of 2 positions shown; findings below may reference images not displayed]

FINDINGS: The heart size and mediastinal contours are within
normal limits.  Both lungs are clear.  The visualized skeletal
structures are unremarkable.
IMPRESSION: No active cardiopulmonary disease.

## 2013-08-07 ENCOUNTER — Inpatient Hospital Stay (HOSPITAL_COMMUNITY): Payer: Medicaid Other

## 2013-08-07 ENCOUNTER — Inpatient Hospital Stay (HOSPITAL_COMMUNITY)
Admission: AD | Admit: 2013-08-07 | Discharge: 2013-08-07 | Disposition: A | Discharge status: Home / Self Care | Payer: Medicaid Other | Source: Ambulatory Visit | Attending: Obstetrics & Gynecology | Admitting: Obstetrics & Gynecology

## 2013-08-07 ENCOUNTER — Encounter (HOSPITAL_COMMUNITY): Payer: Self-pay | Admitting: General Practice

## 2013-08-07 DIAGNOSIS — O0930 Supervision of pregnancy with insufficient antenatal care, unspecified trimester: Secondary | ICD-10-CM

## 2013-08-07 DIAGNOSIS — O99891 Other specified diseases and conditions complicating pregnancy: Secondary | ICD-10-CM | POA: Diagnosis not present

## 2013-08-07 DIAGNOSIS — Z87891 Personal history of nicotine dependence: Secondary | ICD-10-CM | POA: Diagnosis not present

## 2013-08-07 DIAGNOSIS — O0933 Supervision of pregnancy with insufficient antenatal care, third trimester: Secondary | ICD-10-CM

## 2013-08-07 DIAGNOSIS — O9989 Other specified diseases and conditions complicating pregnancy, childbirth and the puerperium: Principal | ICD-10-CM

## 2013-08-07 DIAGNOSIS — R102 Pelvic and perineal pain: Secondary | ICD-10-CM

## 2013-08-07 DIAGNOSIS — N949 Unspecified condition associated with female genital organs and menstrual cycle: Secondary | ICD-10-CM | POA: Insufficient documentation

## 2013-08-07 DIAGNOSIS — O26899 Other specified pregnancy related conditions, unspecified trimester: Secondary | ICD-10-CM | POA: Diagnosis present

## 2013-08-07 LAB — CBC WITH DIFFERENTIAL/PLATELET
BASOS ABS: 0 10*3/uL (ref 0.0–0.1)
BASOS PCT: 0 % (ref 0–1)
EOS ABS: 0.1 10*3/uL (ref 0.0–0.7)
Eosinophils Relative: 1 % (ref 0–5)
HCT: 31.9 % — ABNORMAL LOW (ref 36.0–46.0)
Hemoglobin: 10.4 g/dL — ABNORMAL LOW (ref 12.0–15.0)
Lymphocytes Relative: 25 % (ref 12–46)
Lymphs Abs: 1.9 10*3/uL (ref 0.7–4.0)
MCH: 28.7 pg (ref 26.0–34.0)
MCHC: 32.6 g/dL (ref 30.0–36.0)
MCV: 88.1 fL (ref 78.0–100.0)
Monocytes Absolute: 0.4 10*3/uL (ref 0.1–1.0)
Monocytes Relative: 6 % (ref 3–12)
NEUTROS PCT: 68 % (ref 43–77)
Neutro Abs: 5.2 10*3/uL (ref 1.7–7.7)
PLATELETS: 221 10*3/uL (ref 150–400)
RBC: 3.62 MIL/uL — AB (ref 3.87–5.11)
RDW: 14.9 % (ref 11.5–15.5)
WBC: 7.6 10*3/uL (ref 4.0–10.5)

## 2013-08-07 LAB — URINALYSIS, ROUTINE W REFLEX MICROSCOPIC
BILIRUBIN URINE: NEGATIVE
GLUCOSE, UA: NEGATIVE mg/dL
Hgb urine dipstick: NEGATIVE
KETONES UR: NEGATIVE mg/dL
Leukocytes, UA: NEGATIVE
Nitrite: NEGATIVE
Protein, ur: NEGATIVE mg/dL
Specific Gravity, Urine: 1.01 (ref 1.005–1.030)
Urobilinogen, UA: 0.2 mg/dL (ref 0.0–1.0)
pH: 7 (ref 5.0–8.0)

## 2013-08-07 LAB — RAPID URINE DRUG SCREEN, HOSP PERFORMED
AMPHETAMINES: NOT DETECTED
Barbiturates: NOT DETECTED
Benzodiazepines: NOT DETECTED
Cocaine: NOT DETECTED
Opiates: NOT DETECTED
TETRAHYDROCANNABINOL: NOT DETECTED

## 2013-08-07 LAB — BASIC METABOLIC PANEL
ANION GAP: 10 (ref 5–15)
BUN: 6 mg/dL (ref 6–23)
CO2: 24 mEq/L (ref 19–32)
Calcium: 8.4 mg/dL (ref 8.4–10.5)
Chloride: 102 mEq/L (ref 96–112)
Creatinine, Ser: 0.66 mg/dL (ref 0.50–1.10)
Glucose, Bld: 77 mg/dL (ref 70–99)
Potassium: 3.6 mEq/L — ABNORMAL LOW (ref 3.7–5.3)
SODIUM: 136 meq/L — AB (ref 137–147)

## 2013-08-07 LAB — TYPE AND SCREEN
ABO/RH(D): O POS
ANTIBODY SCREEN: NEGATIVE

## 2013-08-07 LAB — WET PREP, GENITAL
Clue Cells Wet Prep HPF POC: NONE SEEN
Trich, Wet Prep: NONE SEEN
Yeast Wet Prep HPF POC: NONE SEEN

## 2013-08-07 LAB — RAPID HIV SCREEN (WH-MAU): Rapid HIV Screen: NONREACTIVE

## 2013-08-07 LAB — OB RESULTS CONSOLE GC/CHLAMYDIA
Chlamydia: NEGATIVE
Gonorrhea: NEGATIVE

## 2013-08-07 LAB — HEPATITIS B SURFACE ANTIGEN: Hepatitis B Surface Ag: NEGATIVE

## 2013-08-07 MED ORDER — PRENATAL 27-1 MG PO TABS: 1.0000 | ORAL_TABLET | ORAL | Status: DC

## 2013-08-07 NOTE — MAU Provider Note (Signed)
Chief Complaint:  Pelvic Pain    HPI: Pamela Schaefer is a 23 y.o. [phone] with unknown LMP who presents to maternity admissions reporting vaginal pain that is constant associated with a white discharge for the past 2 days.  She also presented with contractions occuring at approximately 5 minute intervals.  She has not received any prenatal care for this pregnancy and does not know the gestational age of this fetus.  She denies any dysuria or sx consistent with UTI.   Denies leakage of fluid or vaginal bleeding. Good fetal movement.   Pregnancy Course:   Past Medical History: Past Medical History  Diagnosis Date  . No pertinent past medical history   . Pregnancy induced hypertension   . Infection     UTI    Past obstetric history: OB History  Gravida Para Term Preterm AB SAB TAB Ectopic Multiple Living  5 3 3  0 1 1 0 0 0 3    # Outcome Date GA Lbr Len/2nd Weight Sex Delivery Anes PTL Lv  5 CUR           4 TRM 12/08/12 [redacted]w[redacted]d 21:00 / 00:29 3.525 kg (7 lb 12.3 oz) F SVD EPI  Y     Comments: none  3 TRM 04/25/11 [redacted]w[redacted]d 08:43 / 00:17 3.856 kg (8 lb 8 oz) F SVD EPI  Y     Comments: NA  2 TRM 09/21/07 [redacted]w[redacted]d 08:00 2.892 kg (6 lb 6 oz) M SVD EPI  Y  1 SAB              Comments: System Generated. Please review and update pregnancy details.      Past Surgical History: Past Surgical History  Procedure Laterality Date  . Clavicle surgery       Family History: Family History  Problem Relation Age of Onset  . Diabetes Maternal Grandmother   . Hypertension Maternal Grandmother   . Heart disease Maternal Grandfather   . Kidney disease Maternal Grandfather     failure    Social History: History  Substance Use Topics  . Smoking status: Former Smoker -- 0.10 packs/day    Types: Cigarettes  . Smokeless tobacco: Never Used     Comment: quit over year ago- 2013  . Alcohol Use: No    Allergies:  Allergies  Allergen Reactions  . Penicillins Itching    Meds:  Prescriptions  prior to admission  Medication Sig Dispense Refill  . acetaminophen (TYLENOL) 500 MG tablet Take 1,000 mg by mouth every 6 (six) hours as needed for mild pain or moderate pain.      . calcium carbonate (TUMS - DOSED IN MG ELEMENTAL CALCIUM) 500 MG chewable tablet Chew 2 tablets by mouth 2 (two) times daily as needed for indigestion or heartburn.      . diphenhydramine-acetaminophen (TYLENOL PM) 25-500 MG TABS Take 2 tablets by mouth at bedtime as needed (sleep).        ROS: Pertinent findings in history of present illness.  Physical Exam  Blood pressure 106/57, pulse 76, temperature 98.7 F (37.1 C), temperature source Oral, resp. rate 18, height 5\' 3"  (1.6 m), weight 71.85 kg (158 lb 6.4 oz), not currently breastfeeding. GENERAL: Well-developed, well-nourished female in no acute distress.  HEENT: normocephalic HEART: normal rate RESP: normal effort ABDOMEN: Soft, non-tender, gravid, Fundal height: 31cm EXTREMITIES: Nontender, no edema NEURO: alert and oriented SPECULUM EXAM: Os closed, firm.  White discharge with no bleeding.     FHT:  Baseline  148 bpm  , moderate variability,contractions: q 5 mins   Labs: Results for orders placed during the hospital encounter of 08/07/13 (from the past 24 hour(s))  URINALYSIS, ROUTINE W REFLEX MICROSCOPIC     Status: None   Collection Time    08/07/13 10:35 AM      Result Value Ref Range   Color, Urine YELLOW  YELLOW   APPearance CLEAR  CLEAR   Specific Gravity, Urine 1.010  1.005 - 1.030   pH 7.0  5.0 - 8.0   Glucose, UA NEGATIVE  NEGATIVE mg/dL   Hgb urine dipstick NEGATIVE  NEGATIVE   Bilirubin Urine NEGATIVE  NEGATIVE   Ketones, ur NEGATIVE  NEGATIVE mg/dL   Protein, ur NEGATIVE  NEGATIVE mg/dL   Urobilinogen, UA 0.2  0.0 - 1.0 mg/dL   Nitrite NEGATIVE  NEGATIVE   Leukocytes, UA NEGATIVE  NEGATIVE  URINE RAPID DRUG SCREEN (HOSP PERFORMED)     Status: None   Collection Time    08/07/13 10:35 AM      Result Value Ref Range   Opiates  NONE DETECTED  NONE DETECTED   Cocaine NONE DETECTED  NONE DETECTED   Benzodiazepines NONE DETECTED  NONE DETECTED   Amphetamines NONE DETECTED  NONE DETECTED   Tetrahydrocannabinol NONE DETECTED  NONE DETECTED   Barbiturates NONE DETECTED  NONE DETECTED  CBC WITH DIFFERENTIAL     Status: Abnormal   Collection Time    08/07/13 12:48 PM      Result Value Ref Range   WBC 7.6  4.0 - 10.5 K/uL   RBC 3.62 (*) 3.87 - 5.11 MIL/uL   Hemoglobin 10.4 (*) 12.0 - 15.0 g/dL   HCT 16.1 (*) 09.6 - 04.5 %   MCV 88.1  78.0 - 100.0 fL   MCH 28.7  26.0 - 34.0 pg   MCHC 32.6  30.0 - 36.0 g/dL   RDW 40.9  81.1 - 91.4 %   Platelets 221  150 - 400 K/uL   Neutrophils Relative % 68  43 - 77 %   Neutro Abs 5.2  1.7 - 7.7 K/uL   Lymphocytes Relative 25  12 - 46 %   Lymphs Abs 1.9  0.7 - 4.0 K/uL   Monocytes Relative 6  3 - 12 %   Monocytes Absolute 0.4  0.1 - 1.0 K/uL   Eosinophils Relative 1  0 - 5 %   Eosinophils Absolute 0.1  0.0 - 0.7 K/uL   Basophils Relative 0  0 - 1 %   Basophils Absolute 0.0  0.0 - 0.1 K/uL  BASIC METABOLIC PANEL     Status: Abnormal   Collection Time    08/07/13 12:48 PM      Result Value Ref Range   Sodium 136 (*) 137 - 147 mEq/L   Potassium 3.6 (*) 3.7 - 5.3 mEq/L   Chloride 102  96 - 112 mEq/L   CO2 24  19 - 32 mEq/L   Glucose, Bld 77  70 - 99 mg/dL   BUN 6  6 - 23 mg/dL   Creatinine, Ser 7.82  0.50 - 1.10 mg/dL   Calcium 8.4  8.4 - 95.6 mg/dL   GFR calc non Af Amer >90  >90 mL/min   GFR calc Af Amer >90  >90 mL/min   Anion gap 10  5 - 15  TYPE AND SCREEN     Status: None   Collection Time    08/07/13 12:48 PM  Result Value Ref Range   ABO/RH(D) O POS     Antibody Screen NEG     Sample Expiration 08/10/2013    RAPID HIV SCREEN [facility])     Status: None   Collection Time    08/07/13 12:48 PM      Result Value Ref Range   SUDS Rapid HIV Screen NON REACTIVE  NON REACTIVE  WET PREP, GENITAL     Status: Abnormal   Collection Time    08/07/13  2:16 PM       Result Value Ref Range   Yeast Wet Prep HPF POC NONE SEEN  NONE SEEN   Trich, Wet Prep NONE SEEN  NONE SEEN   Clue Cells Wet Prep HPF POC NONE SEEN  NONE SEEN   WBC, Wet Prep HPF POC MANY (*) NONE SEEN    Imaging:  No results found. MAU Course:   Assessment: No diagnosis found.  Plan: Discharge home Labor precautions and fetal kick counts    Medication List    ASK your doctor about these medications       acetaminophen 500 MG tablet  Commonly known as:  TYLENOL  Take 1,000 mg by mouth every 6 (six) hours as needed for mild pain or moderate pain.     calcium carbonate 500 MG chewable tablet  Commonly known as:  TUMS - dosed in mg elemental calcium  Chew 2 tablets by mouth 2 (two) times daily as needed for indigestion or heartburn.     diphenhydramine-acetaminophen 25-500 MG Tabs  Commonly known as:  TYLENOL PM  Take 2 tablets by mouth at bedtime as needed (sleep).        Emerson Monte, Student-PA 08/07/2013 1:09 PM  Addendum: SUBJECTIVE: Reports several weeks of intermittent sharp pains in lower abdomen, especially left groin. Pain at times worse with movement and occurs at rest. Denies upper abdominal pain or awareness of UCs. Presented due to new onset 2 days ago of vaginal pain/pressure and thick white non-irritative discharge. Last intercourse last night, not painful.   NPC. Had Depo once after SVD 8 months ago. Denies vaginal bleeding or leaking. Good FM. No PNVs.   OBJECTIVE:  Abd: soft, fundus at costal margin  FHR 135-140 baseline, moderate variability, accelerations present, no decelerations Toco: irreg low amplitude UCs x 30-40 sec, diminished to UI.  Spec: NEFG, physiologic white discharge, cx clean VE: posterior, int os closed, long, presenting part out of pelvis  MAU Course   MDM: Pt not feeling UCs, well hydrated, normal VE so this is likely UI without PTL. Could not do fFN due to recent intercourse. GC/CT sent WP with WBCs. GC/CT  sent.  ASSESSMENT:  1. Insufficient prenatal care, third trimester   2. Pain of round ligament affecting pregnancy, antepartum   23 yo [id number] at 28 wks  PLAN: Discharge RLP relief measures discussed   Medication List         acetaminophen 500 MG tablet  Commonly known as:  TYLENOL  Take 1,000 mg by mouth every 6 (six) hours as needed for mild pain or moderate pain.     calcium carbonate 500 MG chewable tablet  Commonly known as:  TUMS - dosed in mg elemental calcium  Chew 2 tablets by mouth 2 (two) times daily as needed for indigestion or heartburn.     diphenhydramine-acetaminophen 25-500 MG Tabs  Commonly known as:  TYLENOL PM  Take 2 tablets by mouth at bedtime as needed (sleep).     PRENATAL  27-1 MG Tabs  Take 1 tablet by mouth 1 day or 1 dose.       Follow-up Information   Schedule an appointment as soon as possible for a visit with [facility]. (Choose pregnancy care provider from the list given)    Contact information:   [address] [place] [id number]       [facility] [place]   Contact information:   [address] [place] [address] [phone]    List of providers and note sent to [facility]. Advised her to try [facility] (where she has been before), [facility] and note sent to [facility] to get her an appointment if needed.

## 2013-08-07 NOTE — Discharge Instructions (Signed)
Preterm Labor Information Preterm labor is when labor starts at less than 37 weeks of pregnancy. The normal length of a pregnancy is 39 to 41 weeks. CAUSES Often, there is no identifiable underlying cause as to why a woman goes into preterm labor. One of the most common known causes of preterm labor is infection. Infections of the uterus, cervix, vagina, amniotic sac, bladder, kidney, or even the lungs (pneumonia) can cause labor to start. Other suspected causes of preterm labor include:   Urogenital infections, such as yeast infections and bacterial vaginosis.   Uterine abnormalities (uterine shape, uterine septum, fibroids, or bleeding from the placenta).   A cervix that has been operated on (it may fail to stay closed).   Malformations in the fetus.   Multiple gestations (twins, triplets, and so on).   Breakage of the amniotic sac.  RISK FACTORS  Having a previous history of preterm labor.   Having premature rupture of membranes (PROM).   Having a placenta that covers the opening of the cervix (placenta previa).   Having a placenta that separates from the uterus (placental abruption).   Having a cervix that is too weak to hold the fetus in the uterus (incompetent cervix).   Having too much fluid in the amniotic sac (polyhydramnios).   Taking illegal drugs or smoking while pregnant.   Not gaining enough weight while pregnant.   Being younger than 3218 and older than 22 years old.   Having a low socioeconomic status.   Being African American. SYMPTOMS Signs and symptoms of preterm labor include:   Menstrual-like cramps, abdominal pain, or back pain.  Uterine contractions that are regular, as frequent as six in an hour, regardless of their intensity (may be mild or painful).  Contractions that start on the top of the uterus and spread down to the lower abdomen and back.   A sense of increased pelvic pressure.   A watery or bloody mucus discharge that  comes from the vagina.  TREATMENT Depending on the length of the pregnancy and other circumstances, your health care provider may suggest bed rest. If necessary, there are medicines that can be given to stop contractions and to mature the fetal lungs. If labor happens before 34 weeks of pregnancy, a prolonged hospital stay may be recommended. Treatment depends on the condition of both you and the fetus.  WHAT SHOULD YOU DO IF YOU THINK YOU ARE IN PRETERM LABOR? Call your health care provider right away. You will need to go to the hospital to get checked immediately. HOW CAN YOU PREVENT PRETERM LABOR IN FUTURE PREGNANCIES? You should:   Stop smoking if you smoke.  Maintain healthy weight gain and avoid chemicals and drugs that are not necessary.  Be watchful for any type of infection.  Inform your health care provider if you have a known history of preterm labor. Document Released: 03/12/2003 Document Revised: 08/22/2012 Document Reviewed: 01/23/2012 [facility] Patient Information 2015 [place]. This information is not intended to replace advice given to you by your health care provider. Make sure you discuss any questions you have with your health care provider. Prenatal Care [facility]    [facility]  [phone]     Phone: [phone]          [facility]                      [facility] [place]  @[place]     Phone: [phone]  Phone: [phone]         [facility]     Phone: [phone]  Phone: [phone]           [facility] [place]                hone: [phone]  Phone: [phone]         [facility] Dr[address] Francoise Ceo      Phone: [phone]  Phone: [phone]         [facility]                Phone: [phone]  [facility]   [address]  [place])          Phone: [phone] [facility]    ________________________________________     To schedule your Maternity Eligibility Appointment, please call [phone].  When you arrive for your appointment you must bring the following items or information listed below.  Your appointment will be rescheduled if you do not have these items or are 15 minutes late. If currently receiving Medicaid, you MUST bring: 1. Medicaid Card 2. Social Security Card 3. Picture ID 4. Proof of Pregnancy 5. Verification of current address if the address on Medicaid card is incorrect "postmarked mail" If not receiving Medicaid, you MUST bring: 1. Social Security Card 2. Picture ID 3. Birth Certificate (if available) Passport or *Green Card 4. Proof of Pregnancy 5. Verification of current address "postmarked mail" for each income presented. 6. Verification of insurance coverage, if any 7. Check stubs from each employer for the previous month (if unable to present check stub  for each week, we will accept check stub for the first and last week ill the same month.) If you can't locate check stubs, you must bring a letter from the employer(s) and it must have the following information on letterhead, typed, in English: o name of company o company telephone number o how long been with the company, if less than one month o how much person earns per hour o how many hours per week work o the gross pay the person earned for the previous month If you are 22 years old or less, you do not have to bring proof of income unless you work or live with the father of the baby and at that time we will need proof of income from you and/or the father of the baby. Green Card recipients are eligible for Medicaid for Pregnant Women (MPW)    Phone: [phone]

## 2013-08-07 NOTE — MAU Note (Signed)
abdoment measure approximately 28 weeks
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
## 2013-08-08 LAB — URINE CULTURE
Colony Count: 2000
Special Requests: NORMAL

## 2013-08-08 LAB — RUBELLA SCREEN: Rubella: 5.4 Index — ABNORMAL HIGH (ref <0.90)

## 2013-08-08 LAB — GC/CHLAMYDIA PROBE AMP
CT Probe RNA: NEGATIVE
GC Probe RNA: NEGATIVE

## 2013-08-08 LAB — VARICELLA ZOSTER ANTIBODY, IGG: Varicella IgG: 124.2 Index (ref <135.00)

## 2013-08-12 ENCOUNTER — Telehealth: Payer: Self-pay | Admitting: *Deleted

## 2013-08-12 NOTE — Telephone Encounter (Signed)
Call from orthopedic doctor office regarding pregnant patient. Nurse states she is in the office suffering from back pain and wants to know if it is ok for patient to take 2 Rx medications.She states the patient is 20 weeks and the doctor would like to give her Flexeril 5mg  and Tramadol 50mg . Told nurse that those medications are ok during pregnancy. Dr Clearance CootsHarper notified and is ok with the medication. Spoke to patient and told her it is ok- we prefer that she not use long term. Patient states she has an appointment on 09/02/2013 for her NOB. In looking at her chart- the patient has not had prenatal care, except at the hospital and she is further along in her pregnancy than expected. She does have an appointment, but it is for a pap and not NOB. Will see if we can get her in the office earlier.

## 2013-08-14 ENCOUNTER — Encounter: Payer: Medicaid Other | Admitting: Obstetrics

## 2013-08-14 NOTE — MAU Provider Note (Signed)
Attestation of Attending Supervision of Advanced Practitioner (PA/CNM/NP): Evaluation and management procedures were performed by the Advanced Practitioner under my supervision and collaboration.  I have reviewed the Advanced Practitioner's note and chart, and I agree with the management and plan.  Journii Nierman, MD, FACOG Attending Obstetrician & Gynecologist Faculty Practice, [facility]   

## 2013-09-02 ENCOUNTER — Ambulatory Visit: Payer: Medicaid Other | Admitting: Obstetrics

## 2013-09-25 ENCOUNTER — Telehealth: Payer: Self-pay | Admitting: *Deleted

## 2013-09-25 NOTE — Telephone Encounter (Signed)
Pt placed call to office stating that she is in need of medical clearance due to a fall at work. In review of pt chart, pt is noted at 35 weeks with no prenatal care at our office.  Pt has had limited care at the hospital. Return call to pt.  Pt made aware that since she has not been seen in our office during this pregnancy we are not able to provide her the clearance needed for further medical care.  Pt was advised to contact [facility] for appt or go to MAU for evaluation and clearance since this is the only place of care for this pregnancy.  Pt also advised that she will not be able to continue care in our office for this pregnancy given her gestational age. Pt states understanding.

## 2013-10-05 ENCOUNTER — Inpatient Hospital Stay (HOSPITAL_COMMUNITY)
Admission: EM | Admit: 2013-10-05 | Discharge: 2013-10-05 | Disposition: A | Discharge status: Other Acute Inpatient Hospital | Payer: Medicaid Other | Attending: Family Medicine | Admitting: Family Medicine

## 2013-10-05 ENCOUNTER — Encounter (HOSPITAL_COMMUNITY): Payer: Self-pay | Admitting: Emergency Medicine

## 2013-10-05 DIAGNOSIS — Z3A36 36 weeks gestation of pregnancy: Secondary | ICD-10-CM | POA: Diagnosis not present

## 2013-10-05 DIAGNOSIS — Z87891 Personal history of nicotine dependence: Secondary | ICD-10-CM | POA: Diagnosis not present

## 2013-10-05 DIAGNOSIS — Z8619 Personal history of other infectious and parasitic diseases: Secondary | ICD-10-CM | POA: Insufficient documentation

## 2013-10-05 DIAGNOSIS — Z880 Allergy status to penicillin: Secondary | ICD-10-CM | POA: Diagnosis not present

## 2013-10-05 DIAGNOSIS — IMO0001 Reserved for inherently not codable concepts without codable children: Secondary | ICD-10-CM

## 2013-10-05 DIAGNOSIS — O9883 Other maternal infectious and parasitic diseases complicating the puerperium: Secondary | ICD-10-CM

## 2013-10-05 DIAGNOSIS — O4703 False labor before 37 completed weeks of gestation, third trimester: Secondary | ICD-10-CM

## 2013-10-05 HISTORY — DX: Nausea with vomiting, unspecified: R11.2

## 2013-10-05 HISTORY — DX: Other specified postprocedural states: Z98.890

## 2013-10-05 LAB — TYPE AND SCREEN
ABO/RH(D): O POS
Antibody Screen: NEGATIVE

## 2013-10-05 LAB — COMPREHENSIVE METABOLIC PANEL
ALT: 7 U/L (ref 0–35)
AST: 18 U/L (ref 0–37)
Albumin: 2.9 g/dL — ABNORMAL LOW (ref 3.5–5.2)
Alkaline Phosphatase: 120 U/L — ABNORMAL HIGH (ref 39–117)
Anion gap: 15 (ref 5–15)
BUN: 8 mg/dL (ref 6–23)
CO2: 20 mEq/L (ref 19–32)
Calcium: 9.3 mg/dL (ref 8.4–10.5)
Chloride: 99 mEq/L (ref 96–112)
Creatinine, Ser: 0.69 mg/dL (ref 0.50–1.10)
GLUCOSE: 75 mg/dL (ref 70–99)
POTASSIUM: 3.9 meq/L (ref 3.7–5.3)
Sodium: 134 mEq/L — ABNORMAL LOW (ref 137–147)
TOTAL PROTEIN: 7.2 g/dL (ref 6.0–8.3)
Total Bilirubin: 0.8 mg/dL (ref 0.3–1.2)

## 2013-10-05 LAB — WET PREP, GENITAL
Clue Cells Wet Prep HPF POC: NONE SEEN
Trich, Wet Prep: NONE SEEN
Yeast Wet Prep HPF POC: NONE SEEN

## 2013-10-05 LAB — DIFFERENTIAL
Basophils Absolute: 0 10*3/uL (ref 0.0–0.1)
Basophils Relative: 0 % (ref 0–1)
EOS ABS: 0 10*3/uL (ref 0.0–0.7)
EOS PCT: 0 % (ref 0–5)
LYMPHS ABS: 2.1 10*3/uL (ref 0.7–4.0)
Lymphocytes Relative: 24 % (ref 12–46)
MONOS PCT: 4 % (ref 3–12)
Monocytes Absolute: 0.4 10*3/uL (ref 0.1–1.0)
NEUTROS PCT: 72 % (ref 43–77)
Neutro Abs: 6.1 10*3/uL (ref 1.7–7.7)

## 2013-10-05 LAB — CBC
HCT: 32.3 % — ABNORMAL LOW (ref 36.0–46.0)
HEMOGLOBIN: 10.6 g/dL — AB (ref 12.0–15.0)
MCH: 27.8 pg (ref 26.0–34.0)
MCHC: 32.8 g/dL (ref 30.0–36.0)
MCV: 84.8 fL (ref 78.0–100.0)
Platelets: 251 10*3/uL (ref 150–400)
RBC: 3.81 MIL/uL — ABNORMAL LOW (ref 3.87–5.11)
RDW: 13.7 % (ref 11.5–15.5)
WBC: 8.6 10*3/uL (ref 4.0–10.5)

## 2013-10-05 LAB — HIV ANTIBODY (ROUTINE TESTING W REFLEX): HIV: NONREACTIVE

## 2013-10-05 LAB — RAPID HIV SCREEN (WH-MAU): Rapid HIV Screen: NONREACTIVE

## 2013-10-05 LAB — OB RESULTS CONSOLE GBS: STREP GROUP B AG: NEGATIVE

## 2013-10-05 LAB — OB RESULTS CONSOLE ABO/RH: RH Type: POSITIVE

## 2013-10-05 LAB — OB RESULTS CONSOLE ANTIBODY SCREEN: Antibody Screen: NEGATIVE

## 2013-10-05 MED ORDER — SODIUM CHLORIDE 0.9 % IV BOLUS (SEPSIS)
1000.0000 mL | Freq: Once | INTRAVENOUS | Status: AC
  Administered 2013-10-05: 1000 mL via INTRAVENOUS

## 2013-10-05 MED ORDER — FLUCONAZOLE 150 MG PO TABS
150.0000 mg | ORAL_TABLET | Freq: Once | ORAL | Status: AC
  Administered 2013-10-05: 150 mg via ORAL
  Filled 2013-10-05: qty 1

## 2013-10-05 NOTE — Progress Notes (Signed)
Spoke with Dr. Shawnie PonsPratt. Pt is 2-3 cm dilated, 60% effaced, -2,-3 station. Unsure of frequency of contractions. Toco adj. FHR is a category 1 tracing. Baseline 135BPM, mod variability, acells, no decels. Dr. Silverio LayYao has already spoken with Dr. Shawnie PonsPratt [facility].

## 2013-10-05 NOTE — ED Notes (Signed)
Dr Criss AlvineGoldston, Dr Tanna SavoyPlunket and Dr Margreta JourneyGunalda at bedside.

## 2013-10-05 NOTE — Progress Notes (Signed)
Spoke with Maralyn SagoSarah RN, charge nurse [facility], MAU. Pt has had no PNC is 2-3cm, 60%, -2-3 station. Uc's every 3 min. FHR is reactive, a category 1 tracing. Baseline is 135 bpm, mod variability, accels, no decels.Waiting for EMS.

## 2013-10-05 NOTE — ED Notes (Signed)
Dr. Yao at bedside. 

## 2013-10-05 NOTE — ED Notes (Signed)
FHT 171

## 2013-10-05 NOTE — ED Notes (Addendum)
OB rapid at bedside.  Corrie DandyMary, RN reports 2 cm dilation.

## 2013-10-05 NOTE — Discharge Instructions (Signed)
Normal Labor and Delivery ° °Your caregiver must first be sure you are in labor. Signs of labor include: ° °· You may pass what is called "the mucus plug" before labor begins. This is a small amount of blood stained mucus. °· Regular uterine contractions. °· The time between contractions get closer together. °· The discomfort and pain gradually gets more intense. °· Pains are mostly located in the back. °· Pains get worse when walking. °· The cervix (the opening of the uterus becomes thinner (begins to efface) and opens up (dilates). ° ° °Once you are in labor and admitted into the hospital or care center, your caregiver will do the following: ° °· A complete physical examination. °· Check your vital signs (blood pressure, pulse, temperature and the fetal heart rate). °· Do a vaginal examination (using a sterile glove and lubricant) to determine: °· The position (presentation) of the baby (head [vertex] or buttock first). °· The level (station) of the baby's head in the birth canal. °· The effacement and dilatation of the cervix. °· An electronic monitor is usually placed on your abdomen. The monitor follows the length and intensity of the contractions, as well as the baby's heart rate. °· your caregiver may insert an IV in your arm with a bottle of sugar water. This is done as a precaution so that medications can be given to you quickly during labor or delivery. ° ° °NORMAL LABOR AND DELIVERY IS DIVIDED UP INTO 3 STAGES: ° °First Stage °This is when regular contractions begin and the cervix begins to efface and dilate. This stage can last from 3 to 15 hours. The end of the first stage is when the cervix is 100% effaced and 10 centimeters dilated. Pain medications may be given by  °· Injection (morphine, demerol, etc.) °· Regional anesthesia (spinal, caudal or epidural, anesthetics given in different locations of the spine). Paracervical pain medication may be given, which is an injection of and anesthetic on each  side of the cervix. °A pregnant woman may request to have "Natural Childbirth" which is not to have any medications or anesthesia during her labor and delivery. ° °Second Stage °This is when the baby comes down through the birth canal (vagina) and is born. This can take 1 to 4 hours. As the baby's head comes down through the birth canal, you may feel like you are going to have a bowel movement. You will get the urge to bear down and push until the baby is delivered. As the baby's head is being delivered, the caregiver will decide if an episiotomy (a cut in the perineum and vagina area) is needed to prevent tearing of the tissue in this area. The episiotomy is sewn up after the delivery of the baby and placenta. Sometimes a mask with nitrous oxide is given for the mother to breath during the delivery of the baby to help if there is too much pain. The end of Stage 2 is when the baby is fully delivered. Then when the umbilical cord stops pulsating it is clamped and cut. ° °Third Stage °The third stage begins after the baby is completely delivered and ends after the placenta (afterbirth) is delivered. This usually takes 5 to 30 minutes. After the placenta is delivered, a medication is given either by intravenous or injection to help contract the uterus and prevent bleeding. The third stage is not painful and pain medication is usually not necessary. If there was a tear, it is repaired at this time. °  After the delivery, the mother is watched and monitored closely for 1 to 2 hours to make sure there is no postpartum bleeding (hemorrhage). If there is a lot of bleeding, medication is given to contract the uterus and stop the bleeding. ° ° °Document Released: 09/29/2007 Document Revised: 03/14/2011 Document Reviewed: 09/29/2007 °ExitCare® Patient Information ©2013 ExitCare, LLC. ° ° °

## 2013-10-05 NOTE — MAU Provider Note (Signed)
Attestation of Attending Supervision of Advanced Practitioner (PA/CNM/NP): Evaluation and management procedures were performed by the Advanced Practitioner under my supervision and collaboration.  I have reviewed the Advanced Practitioner's note and chart, and I agree with the management and plan.  Alyanna Stoermer S, MD [facility] Faculty Practice Attending 10/05/2013 10:26 PM   

## 2013-10-05 NOTE — ED Provider Notes (Signed)
I saw and evaluated the patient, reviewed the resident's note and I agree with the findings and plan.   EKG Interpretation None      Pamela Schaefer is a 23 y.o. female G4 around 7138 weeks pregnant here with contractions. Comfortable on exam. Gravid uterus. Resident performed pelvic exam, cervix about 2-3 cm dilated. Rapid OB at bedside. Will transfer to [facility].    Richardean Canalavid H Yao, MD 10/05/13 [phone]

## 2013-10-05 NOTE — ED Provider Notes (Signed)
CSN: [id number]     Arrival date & time 10/05/13  1442 History   First MD Initiated Contact with Patient 10/05/13 1456     Chief Complaint  Patient presents with  . Laboring   Patient is a 23 y.o. female presenting with female genitourinary complaint. The history is provided by the patient.  Female GU Problem This is a new problem. The current episode started today. The problem occurs constantly. Associated symptoms include abdominal pain. Pertinent negatives include no chest pain, fever, headaches, nausea, neck pain, rash, sore throat or vomiting. Associated symptoms comments: Back pain, pelvic pain, contractions. Nothing aggravates the symptoms. She has tried nothing for the symptoms.   Patient is a 23 year old African American G4P3 female who presents with abdominal contractions. She states she is approximately 38 weeks. However she has had no prenatal care with this pregnancy. She states this is because she has been dizzy working. She stated says that she is having contractions every 3-4 minutes and rates her pain as 9/10. She denies vaginal leakage of fluid. She reports pelvic and low back pain.  Past Medical History  Diagnosis Date  . No pertinent past medical history   . Pregnancy induced hypertension   . Infection     UTI   Past Surgical History  Procedure Laterality Date  . Clavicle surgery     Family History  Problem Relation Age of Onset  . Diabetes Maternal Grandmother   . Hypertension Maternal Grandmother   . Heart disease Maternal Grandfather   . Kidney disease Maternal Grandfather     failure   History  Substance Use Topics  . Smoking status: Former Smoker -- 0.10 packs/day    Types: Cigarettes  . Smokeless tobacco: Never Used     Comment: quit over year ago- 2013  . Alcohol Use: No   OB History   Grav Para Term Preterm Abortions TAB SAB Ect Mult Living   5 3 3  0 1 0 1 0 0 3     Review of Systems  Constitutional: Negative for fever.  HENT: Negative for  rhinorrhea and sore throat.   Eyes: Negative for visual disturbance.  Respiratory: Negative for chest tightness and shortness of breath.   Cardiovascular: Negative for chest pain and palpitations.  Gastrointestinal: Positive for abdominal pain. Negative for nausea, vomiting and constipation.  Genitourinary: Positive for pelvic pain. Negative for dysuria, hematuria, vaginal bleeding and vaginal discharge.  Musculoskeletal: Positive for back pain. Negative for neck pain.  Skin: Negative for rash.  Neurological: Negative for dizziness and headaches.  Psychiatric/Behavioral: Negative for confusion.  All other systems reviewed and are negative.   Allergies  Penicillins  Home Medications   Prior to Admission medications   Medication Sig Start Date End Date Taking? Authorizing Provider  acetaminophen (TYLENOL) 500 MG tablet Take 1,000 mg by mouth every 6 (six) hours as needed for mild pain or moderate pain.   Yes Historical Provider, MD   BP 120/75  Pulse 76  Temp(Src) 98.1 F (36.7 C) (Oral)  Resp 18  SpO2 100% Physical Exam  Constitutional: She is oriented to person, place, and time. She appears well-developed and well-nourished. No distress.  HENT:  Head: Normocephalic and atraumatic.  Mouth/Throat: Oropharynx is clear and moist.  Eyes: EOM are normal. Pupils are equal, round, and reactive to light.  Neck: Neck supple. No JVD present.  Cardiovascular: Normal rate, regular rhythm, normal heart sounds and intact distal pulses.  Exam reveals no gallop.   No murmur heard.  Pulmonary/Chest: Effort normal and breath sounds normal. She has no wheezes. She has no rales.  Abdominal: Soft. She exhibits no distension. There is no tenderness.  Gravid uterus appropriate for stated dates.   Genitourinary:  Cervix approx 2 cm dilated. Membranes intact. No external lesions.  Musculoskeletal: Normal range of motion. She exhibits no tenderness.  Neurological: She is alert and oriented to person,  place, and time. No cranial nerve deficit. She exhibits normal muscle tone.  Skin: Skin is warm and dry. No rash noted.  Psychiatric: Her behavior is normal.   ED Course  Procedures  None   Labs Review Labs Reviewed  CBC - Abnormal; Notable for the following:    RBC 3.81 (*)    Hemoglobin 10.6 (*)    HCT 32.3 (*)    All other components within normal limits  DIFFERENTIAL  COMPREHENSIVE METABOLIC PANEL  HEPATITIS B SURFACE ANTIGEN  RUBELLA SCREEN  RPR  RAPID HIV SCREEN ([facility])  TYPE AND SCREEN  ABO/RH   MDM   Final diagnoses:  Active labor at term   23 year old G4 presents with abdominal pain and back pain and states she is having contractions. She appears comfortable and well appearing on exam. Fetal monitor applied and demonstrates good fetal heartbeat with accelerations. OB panel was obtained. Bedside pelvic exam performed and the patient appears to be approximately 2-3 cm dilated. Her membranes are intact. Rapid OB nurse is at the bedside. Patient seemed dynamically stable. [facility]. Patient stable at transfer.   Case discussed with Dr. Silverio LayYao.   Maris BergerJonah Knox Cervi, MD 10/05/13 1539  Maris BergerJonah Devonne Kitchen, MD 10/05/13 [id number]

## 2013-10-05 NOTE — MAU Provider Note (Signed)
  History     CSN: [id number]  Arrival date and time: 10/05/13 1442   None     Chief Complaint  Patient presents with  . Laboring   HPI  Patient is 23 y.o. [id number] 5274w3d, +FM, denies LOF, VB.  [facility], here for labor evaluation.  Also stating that has vaginal discharge.  Has not received any prenatal care.  Past Medical History  Diagnosis Date  . No pertinent past medical history   . Pregnancy induced hypertension   . Infection     UTI  . PONV (postoperative nausea and vomiting)     Past Surgical History  Procedure Laterality Date  . Clavicle surgery      Family History  Problem Relation Age of Onset  . Diabetes Maternal Grandmother   . Hypertension Maternal Grandmother   . Heart disease Maternal Grandfather   . Kidney disease Maternal Grandfather     failure    History  Substance Use Topics  . Smoking status: Former Smoker -- 0.10 packs/day    Types: Cigarettes  . Smokeless tobacco: Never Used     Comment: quit over year ago- 2013  . Alcohol Use: No    Allergies:  Allergies  Allergen Reactions  . Penicillins Itching    Prescriptions prior to admission  Medication Sig Dispense Refill  . acetaminophen (TYLENOL) 500 MG tablet Take 1,000 mg by mouth every 6 (six) hours as needed for mild pain or moderate pain.        Review of Systems  Constitutional: Negative for fever and chills.  Respiratory: Negative for cough and shortness of breath.   Cardiovascular: Negative for chest pain and leg swelling.  Gastrointestinal: Negative for heartburn, nausea, vomiting and diarrhea.  Genitourinary: Negative for dysuria, urgency, frequency and hematuria.       Vaginal discharge  Neurological:       No headache   Physical Exam   Blood pressure 95/57, pulse 93, temperature 98.3 F (36.8 C), temperature source Oral, resp. rate 20, height 5\' 3"  (1.6 m), SpO2 100.00%, not currently breastfeeding.  Physical Exam  Constitutional:  She is oriented to person, place, and time. She appears well-developed and well-nourished.  HENT:  Head: Normocephalic and atraumatic.  Eyes: Conjunctivae and EOM are normal.  Neck: Normal range of motion.  Cardiovascular: Normal rate, regular rhythm and normal heart sounds.   Respiratory: Effort normal. No respiratory distress.  GI: Soft. Bowel sounds are normal. She exhibits no distension. There is no tenderness.  Genitourinary: Uterus normal.  Satellite lesions consistent with yeast candida  Musculoskeletal: Normal range of motion. She exhibits no edema.  Neurological: She is alert and oriented to person, place, and time.  Skin: Skin is warm and dry. No erythema.    MAU Course  Procedures  MDM NST reactive Prenatal labs Wet prep: few WBCs  Assessment and Plan  Advised OTC prenatal vitamin Follow up at [facility] department Repeat G/C Would like BTL, consents not signed today 2/2 Given diflucan 150mg  once now given satellite lesions  Roni Scow ROCIO 10/05/2013, 4:40 PM

## 2013-10-05 NOTE — ED Notes (Addendum)
Pt 38 wks; no prenatal care; states she has 3 children already and when in labor, doesn't have intense pains. Cramping and pressure in back. Does not feel need to push.

## 2013-10-05 NOTE — MAU Note (Addendum)
PT PRESENTS  AS TRANSFER FROM  [facility]  VIA [facility]-  [facility]   230[facility].-   FOR UC,  VAG ITCHING AND PERINEAL  CRAMPS- WITH A UC.   SHE WENT TO [place]-  BECAUSE  SHE RODE  CITY BUS  AND GOT OFF THERE .  SHE HAS NOT  HAD ANY PNC  WITH THIS PREG-   BUT HAD PNC WITH [facility] AND HD WITH OTHER BABIES.       HAD AN U/S HERE IN AUG- SEPT.      DOES NOT KNOW LMP.   STARTED UC LAST NIGHT BUT HAS  VAG PAIN.     DENIES HSV AND MRSA.  WITH OTHER BABIES- BP PROBLEMS.      AT [place]- VE- ?     FEELS BABY MOVE.      LAST SEX- 2 WEEKS A GO.

## 2013-10-05 NOTE — ED Notes (Signed)
Dr Margreta JourneyGunalda stated dilation at 5-6 cm

## 2013-10-06 LAB — ABO/RH: ABO/RH(D): O POS

## 2013-10-07 LAB — CULTURE, BETA STREP (GROUP B ONLY)

## 2013-10-07 LAB — GC/CHLAMYDIA PROBE AMP
CT Probe RNA: NEGATIVE
GC Probe RNA: NEGATIVE

## 2013-10-17 ENCOUNTER — Inpatient Hospital Stay (HOSPITAL_COMMUNITY): Payer: Medicaid Other | Admitting: Anesthesiology

## 2013-10-17 ENCOUNTER — Inpatient Hospital Stay (HOSPITAL_COMMUNITY)
Admission: AD | Admit: 2013-10-17 | Discharge: 2013-10-19 | DRG: 775 | Disposition: A | Discharge status: Home / Self Care | Payer: Medicaid Other | Source: Ambulatory Visit | Attending: Obstetrics and Gynecology | Admitting: Obstetrics and Gynecology

## 2013-10-17 ENCOUNTER — Encounter (HOSPITAL_COMMUNITY): Payer: Medicaid Other | Admitting: Anesthesiology

## 2013-10-17 ENCOUNTER — Encounter (HOSPITAL_COMMUNITY): Payer: Self-pay | Admitting: *Deleted

## 2013-10-17 DIAGNOSIS — O99824 Streptococcus B carrier state complicating childbirth: Secondary | ICD-10-CM | POA: Diagnosis present

## 2013-10-17 DIAGNOSIS — IMO0001 Reserved for inherently not codable concepts without codable children: Secondary | ICD-10-CM

## 2013-10-17 DIAGNOSIS — Z833 Family history of diabetes mellitus: Secondary | ICD-10-CM | POA: Diagnosis not present

## 2013-10-17 DIAGNOSIS — O0930 Supervision of pregnancy with insufficient antenatal care, unspecified trimester: Secondary | ICD-10-CM

## 2013-10-17 DIAGNOSIS — Z3A38 38 weeks gestation of pregnancy: Secondary | ICD-10-CM | POA: Diagnosis present

## 2013-10-17 DIAGNOSIS — Z8249 Family history of ischemic heart disease and other diseases of the circulatory system: Secondary | ICD-10-CM

## 2013-10-17 DIAGNOSIS — R51 Headache: Secondary | ICD-10-CM | POA: Diagnosis present

## 2013-10-17 DIAGNOSIS — Z87891 Personal history of nicotine dependence: Secondary | ICD-10-CM

## 2013-10-17 LAB — CBC
HCT: 31.6 % — ABNORMAL LOW (ref 36.0–46.0)
Hemoglobin: 10.3 g/dL — ABNORMAL LOW (ref 12.0–15.0)
MCH: 28.2 pg (ref 26.0–34.0)
MCHC: 32.6 g/dL (ref 30.0–36.0)
MCV: 86.6 fL (ref 78.0–100.0)
PLATELETS: 236 10*3/uL (ref 150–400)
RBC: 3.65 MIL/uL — ABNORMAL LOW (ref 3.87–5.11)
RDW: 14.5 % (ref 11.5–15.5)
WBC: 7.1 10*3/uL (ref 4.0–10.5)

## 2013-10-17 LAB — COMPREHENSIVE METABOLIC PANEL
ALT: 7 U/L (ref 0–35)
ANION GAP: 14 (ref 5–15)
AST: 16 U/L (ref 0–37)
Albumin: 3 g/dL — ABNORMAL LOW (ref 3.5–5.2)
Alkaline Phosphatase: 141 U/L — ABNORMAL HIGH (ref 39–117)
BUN: 6 mg/dL (ref 6–23)
CO2: 21 meq/L (ref 19–32)
CREATININE: 0.71 mg/dL (ref 0.50–1.10)
Calcium: 8.7 mg/dL (ref 8.4–10.5)
Chloride: 100 mEq/L (ref 96–112)
GFR calc Af Amer: >90 mL/min (ref >90)
Glucose, Bld: 72 mg/dL (ref 70–99)
Potassium: 3.8 mEq/L (ref 3.7–5.3)
Sodium: 135 mEq/L — ABNORMAL LOW (ref 137–147)
Total Bilirubin: 0.5 mg/dL (ref 0.3–1.2)
Total Protein: 6.6 g/dL (ref 6.0–8.3)

## 2013-10-17 LAB — RAPID URINE DRUG SCREEN, HOSP PERFORMED
Amphetamines: NOT DETECTED
BARBITURATES: NOT DETECTED
Benzodiazepines: NOT DETECTED
COCAINE: NOT DETECTED
Opiates: NOT DETECTED
TETRAHYDROCANNABINOL: NOT DETECTED

## 2013-10-17 LAB — TYPE AND SCREEN
ABO/RH(D): O POS
Antibody Screen: NEGATIVE

## 2013-10-17 LAB — PROTEIN / CREATININE RATIO, URINE
Creatinine, Urine: 50.2 mg/dL
Protein Creatinine Ratio: 0.17 — ABNORMAL HIGH (ref 0.00–0.15)
Total Protein, Urine: 8.7 mg/dL

## 2013-10-17 MED ORDER — ACETAMINOPHEN 325 MG PO TABS: 650.0000 mg | ORAL_TABLET | ORAL | Status: DC | PRN

## 2013-10-17 MED ORDER — LIDOCAINE HCL (PF) 1 % IJ SOLN
30.0000 mL | INTRAMUSCULAR | Status: DC | PRN
  Filled 2013-10-17: qty 30

## 2013-10-17 MED ORDER — PHENYLEPHRINE 40 MCG/ML (10ML) SYRINGE FOR IV PUSH (FOR BLOOD PRESSURE SUPPORT)
80.0000 ug | PREFILLED_SYRINGE | INTRAVENOUS | Status: DC | PRN
  Administered 2013-10-17: 40 ug via INTRAVENOUS
  Filled 2013-10-17: qty 2

## 2013-10-17 MED ORDER — SODIUM BICARBONATE 8.4 % IV SOLN
INTRAVENOUS | Status: DC | PRN
  Administered 2013-10-17: 5 mL via EPIDURAL

## 2013-10-17 MED ORDER — FENTANYL 2.5 MCG/ML BUPIVACAINE 1/10 % EPIDURAL INFUSION (WH - ANES)
INTRAMUSCULAR | Status: DC | PRN
  Administered 2013-10-17: 14 mL/h via EPIDURAL

## 2013-10-17 MED ORDER — EPHEDRINE 5 MG/ML INJ
10.0000 mg | INTRAVENOUS | Status: DC | PRN
  Filled 2013-10-17: qty 2

## 2013-10-17 MED ORDER — OXYCODONE-ACETAMINOPHEN 5-325 MG PO TABS: 2.0000 | ORAL_TABLET | ORAL | Status: DC | PRN

## 2013-10-17 MED ORDER — PHENYLEPHRINE 40 MCG/ML (10ML) SYRINGE FOR IV PUSH (FOR BLOOD PRESSURE SUPPORT)
80.0000 ug | PREFILLED_SYRINGE | INTRAVENOUS | Status: DC | PRN
  Administered 2013-10-17: 80 ug via INTRAVENOUS
  Filled 2013-10-17: qty 2
  Filled 2013-10-17: qty 10

## 2013-10-17 MED ORDER — OXYTOCIN 40 UNITS IN LACTATED RINGERS INFUSION - SIMPLE MED
62.5000 mL/h | INTRAVENOUS | Status: DC
  Filled 2013-10-17: qty 1000

## 2013-10-17 MED ORDER — OXYTOCIN 40 UNITS IN LACTATED RINGERS INFUSION - SIMPLE MED
1.0000 m[IU]/min | INTRAVENOUS | Status: DC
  Administered 2013-10-17: 2 m[IU]/min via INTRAVENOUS

## 2013-10-17 MED ORDER — ONDANSETRON HCL 4 MG/2ML IJ SOLN: 4.0000 mg | Freq: Four times a day (QID) | INTRAMUSCULAR | Status: DC | PRN

## 2013-10-17 MED ORDER — CITRIC ACID-SODIUM CITRATE 334-500 MG/5ML PO SOLN: 30.0000 mL | ORAL | Status: DC | PRN

## 2013-10-17 MED ORDER — OXYCODONE-ACETAMINOPHEN 5-325 MG PO TABS: 1.0000 | ORAL_TABLET | ORAL | Status: DC | PRN

## 2013-10-17 MED ORDER — LIDOCAINE HCL (PF) 1 % IJ SOLN
INTRAMUSCULAR | Status: DC | PRN
  Administered 2013-10-17: 8 mL
  Administered 2013-10-17: 7 mL

## 2013-10-17 MED ORDER — OXYTOCIN BOLUS FROM INFUSION
500.0000 mL | INTRAVENOUS | Status: DC
  Administered 2013-10-17: 500 mL via INTRAVENOUS

## 2013-10-17 MED ORDER — FENTANYL 2.5 MCG/ML BUPIVACAINE 1/10 % EPIDURAL INFUSION (WH - ANES)
14.0000 mL/h | INTRAMUSCULAR | Status: DC | PRN
  Administered 2013-10-17: 14 mL/h via EPIDURAL
  Filled 2013-10-17: qty 125

## 2013-10-17 MED ORDER — ACETAMINOPHEN 500 MG PO TABS: 1000.0000 mg | ORAL_TABLET | Freq: Once | ORAL | Status: DC

## 2013-10-17 MED ORDER — LACTATED RINGERS IV SOLN
INTRAVENOUS | Status: DC
  Administered 2013-10-17 (×2): via INTRAVENOUS

## 2013-10-17 MED ORDER — DIPHENHYDRAMINE HCL 50 MG/ML IJ SOLN: 12.5000 mg | INTRAMUSCULAR | Status: DC | PRN

## 2013-10-17 MED ORDER — LACTATED RINGERS IV SOLN: 500.0000 mL | Freq: Once | INTRAVENOUS | Status: DC

## 2013-10-17 MED ORDER — TERBUTALINE SULFATE 1 MG/ML IJ SOLN: 0.2500 mg | Freq: Once | INTRAMUSCULAR | Status: AC | PRN

## 2013-10-17 MED ORDER — IBUPROFEN 600 MG PO TABS
600.0000 mg | ORAL_TABLET | Freq: Four times a day (QID) | ORAL | Status: DC
  Administered 2013-10-17 – 2013-10-19 (×6): 600 mg via ORAL
  Filled 2013-10-17 (×6): qty 1

## 2013-10-17 MED ORDER — LACTATED RINGERS IV SOLN
500.0000 mL | INTRAVENOUS | Status: DC | PRN
  Administered 2013-10-17: 500 mL via INTRAVENOUS

## 2013-10-17 NOTE — MAU Note (Signed)
urine in lab 

## 2013-10-17 NOTE — MAU Note (Signed)
Pt had been brought directly to rm via wc.  Reports HA the past 3 days. Eyes started hurting yesterday. Started contracting about an hour ago.  Was short of breath when first arrived, that has passed (resp unlabored, speaks without pausing).  Was dizzy at work.

## 2013-10-17 NOTE — Anesthesia Procedure Notes (Signed)
Epidural Patient location during procedure: OB Start time: 10/17/2013 6:46 PM End time: 10/17/2013 6:50 PM  Staffing Anesthesiologist: Leilani AbleHATCHETT, Jarian Longoria Performed by: anesthesiologist   Preanesthetic Checklist Completed: patient identified, site marked, surgical consent, pre-op evaluation, timeout performed, IV checked, risks and benefits discussed and monitors and equipment checked  Epidural Patient position: sitting Prep: site prepped and draped and DuraPrep Patient monitoring: continuous pulse ox and blood pressure Approach: midline Location: L3-L4 Injection technique: LOR air  Needle:  Needle type: Tuohy  Needle gauge: 17 G Needle length: 9 cm and 9 Needle insertion depth: 5 cm cm Catheter type: closed end flexible Catheter size: 19 Gauge Catheter at skin depth: 10 cm Test dose: negative and Other  Assessment Sensory level: T9 Events: blood not aspirated, injection not painful, no injection resistance, negative IV test and no paresthesia  Additional Notes Reason for block:procedure for pain

## 2013-10-17 NOTE — Progress Notes (Signed)
CSW acknowledges consult.  CSW visited the patient in her labor and delivery room.  CSW introduced self and discussed intention to meet with her on 10/16 after she has delivered the baby.  Patient expressed appreciation for the brief visit and stated that she is looking forward to the visit.

## 2013-10-17 NOTE — Anesthesia Preprocedure Evaluation (Signed)
Anesthesia Evaluation  Patient identified by MRN, date of birth, ID band Patient awake    Reviewed: Allergy & Precautions, H&P , NPO status , Patient's Chart, lab work & pertinent test results  Airway Mallampati: I TM Distance: >3 FB Neck ROM: full    Dental no notable dental hx.    Pulmonary former smoker,    Pulmonary exam normal       Cardiovascular hypertension,     Neuro/Psych negative neurological ROS  negative psych ROS   GI/Hepatic Neg liver ROS,   Endo/Other  negative endocrine ROS  Renal/GU negative Renal ROS     Musculoskeletal   Abdominal Normal abdominal exam  (+)   Peds  Hematology negative hematology ROS (+)   Anesthesia Other Findings   Reproductive/Obstetrics (+) Pregnancy                           Anesthesia Physical Anesthesia Plan  ASA: II  Anesthesia Plan: Epidural   Post-op Pain Management:    Induction:   Airway Management Planned:   Additional Equipment:   Intra-op Plan:   Post-operative Plan:   Informed Consent: I have reviewed the patients History and Physical, chart, labs and discussed the procedure including the risks, benefits and alternatives for the proposed anesthesia with the patient or authorized representative who has indicated his/her understanding and acceptance.     Plan Discussed with:   Anesthesia Plan Comments:         Anesthesia Quick Evaluation

## 2013-10-17 NOTE — H&P (Signed)
LABOR ADMISSION HISTORY AND PHYSICAL  Pamela Schaefer is a 23 y.o. female [phone] with IUP at 4268w1d by dating sono at 3928 weeks presenting for headache, concern of loss of fluid, contractions. She reports +FMs, No LOF, no VB, no blurry vision, or peripheral edema, and RUQ pain. Currently has headache and has hx of gestational HTN. She unsure about breast feeding or contraception.  Patient has not had any prenatal care this pregnancy.  Dating: By Jimmye Norman28w sono --->  Estimated Date of Delivery: 10/30/13    Prenatal History/Complications:  Past Medical History: Past Medical History  Diagnosis Date  . No pertinent past medical history   . Pregnancy induced hypertension   . Infection     UTI  . PONV (postoperative nausea and vomiting)     Past Surgical History: Past Surgical History  Procedure Laterality Date  . Clavicle surgery      Obstetrical History: OB History   Grav Para Term Preterm Abortions TAB SAB Ect Mult Living   5 3 3  0 1 0 1 0 0 3     Social History: History   Social History  . Marital Status: Single    Spouse Name: N/A    Number of Children: 2  . Years of Education: N/A   Occupational History  .     Social History Main Topics  . Smoking status: Former Smoker -- 0.10 packs/day    Types: Cigarettes  . Smokeless tobacco: Never Used     Comment: quit over year ago- 2013  . Alcohol Use: No  . Drug Use: No  . Sexual Activity: Yes    Partners: Male    Birth Control/ Protection: None   Other Topics Concern  . None   Social History Narrative  . None    Family History: Family History  Problem Relation Age of Onset  . Diabetes Maternal Grandmother   . Hypertension Maternal Grandmother   . Heart disease Maternal Grandfather   . Kidney disease Maternal Grandfather     failure  . Heart disease Father     Allergies: Allergies  Allergen Reactions  . Penicillins Itching    Prescriptions prior to admission  Medication Sig Dispense Refill  .  acetaminophen (TYLENOL) 500 MG tablet Take 1,000 mg by mouth every 6 (six) hours as needed for mild pain or moderate pain.      . Prenatal Vit-Fe Fumarate-FA (PRENATAL MULTIVITAMIN) TABS tablet Take 1 tablet by mouth daily at 12 noon.         Review of Systems   All systems reviewed and negative except as stated in HPI  Blood pressure 118/71, pulse 79, temperature 98.5 F (36.9 C), temperature source Oral, resp. rate 18, SpO2 100.00%, not currently breastfeeding. General appearance: alert and cooperative Lungs: clear to auscultation bilaterally Heart: regular rate and rhythm Abdomen: soft, non-tender; bowel sounds normal  Extremities: Homans sign is negative, no sign of DVT DTR's 2+ Presentation: unsure Fetal monitoringBaseline: 150 bpm, Variability: Good {> 6 bpm), Accelerations: Reactive and Decelerations: Absent.  NST initially nonreactive and minimal variability Uterine activityq2-3   Dilation: 4.5 Effacement (%): 80 Station: -2;-3 Exam by:: K.WIlson,RN   Prenatal labs: ABO, Rh: --/--/O POS, O POS (10/03 1455) Antibody: NEG (10/03 1455) Rubella:   RPR: NON REACTIVE (12/05 1733)  HBsAg: NEGATIVE (08/05 1248)  HIV: NONREACTIVE (10/03 1610)  GBS: Positive (10/28 0000)  1 hr Glucola not performed Genetic screening  No prenatal care Anatomy US immune   No results found for  this or any previous visit (from the past 24 hour(s)).  Patient Active Problem List   Diagnosis Date Noted  . Insufficient prenatal care 08/07/2013  . Pain of round ligament affecting pregnancy, antepartum 08/07/2013  . Pain aggravated by activities of daily living 01/24/2013  . NVD (normal vaginal delivery) 12/08/2012  . GBS (group B Streptococcus carrier), +RV culture, currently pregnant 12/03/2012  . GERD without esophagitis 07/31/2012  . Vaginitis and vulvovaginitis, unspecified 07/31/2012  . Abnormality in fetal heart rate/rhythm, antepartum condition or complication 07/03/2012  . Active  labor 04/25/2011  . Normal delivery 04/25/2011    Assessment: Pamela Schaefer is a 23 y.o. [phone] at 6737w1d here for labor eval was found to have nonreactive NST initially, no prenatal care and currently with headache   #Labor:progressing normally #Pain: Epidural upon request #FWB: Cat I currently #ID:  GBS neg #MOF: undecided #MOC:undecided #Circ:  N/a  Headache: blood pressure have been normal, will check preE labs 2/2 hx of gestational htn with previous pregnancies  SOL: patient not quite in active labor, likely early latent labor however given hx of no prenatal care, initially nonreactive NST with poor variability will admit patient, patient agreeable to plan.  Will ultrasound to confirm cephalic presentation  Hilde Churchman ROCIO 10/17/2013, 3:08 PM

## 2013-10-18 ENCOUNTER — Encounter (HOSPITAL_COMMUNITY): Payer: Self-pay

## 2013-10-18 LAB — HIV ANTIBODY (ROUTINE TESTING W REFLEX): HIV 1&2 Ab, 4th Generation: NONREACTIVE

## 2013-10-18 LAB — RPR

## 2013-10-18 MED ORDER — ONDANSETRON HCL 4 MG/2ML IJ SOLN
4.0000 mg | INTRAMUSCULAR | Status: DC | PRN
  Administered 2013-10-18: 4 mg via INTRAVENOUS
  Filled 2013-10-18: qty 2

## 2013-10-18 MED ORDER — METHYLERGONOVINE MALEATE 0.2 MG PO TABS: 0.2000 mg | ORAL_TABLET | ORAL | Status: DC | PRN

## 2013-10-18 MED ORDER — SIMETHICONE 80 MG PO CHEW: 80.0000 mg | CHEWABLE_TABLET | ORAL | Status: DC | PRN

## 2013-10-18 MED ORDER — OXYTOCIN 40 UNITS IN LACTATED RINGERS INFUSION - SIMPLE MED: 62.5000 mL/h | INTRAVENOUS | Status: DC | PRN

## 2013-10-18 MED ORDER — DIBUCAINE 1 % RE OINT: 1.0000 "application " | TOPICAL_OINTMENT | RECTAL | Status: DC | PRN

## 2013-10-18 MED ORDER — FLEET ENEMA 7-19 GM/118ML RE ENEM: 1.0000 | ENEMA | Freq: Every day | RECTAL | Status: DC | PRN

## 2013-10-18 MED ORDER — MEASLES, MUMPS & RUBELLA VAC ~~LOC~~ INJ
0.5000 mL | INJECTION | Freq: Once | SUBCUTANEOUS | Status: DC
  Filled 2013-10-18: qty 0.5

## 2013-10-18 MED ORDER — METHYLERGONOVINE MALEATE 0.2 MG/ML IJ SOLN: 0.2000 mg | INTRAMUSCULAR | Status: DC | PRN

## 2013-10-18 MED ORDER — ZOLPIDEM TARTRATE 5 MG PO TABS: 5.0000 mg | ORAL_TABLET | Freq: Every evening | ORAL | Status: DC | PRN

## 2013-10-18 MED ORDER — WITCH HAZEL-GLYCERIN EX PADS: 1.0000 "application " | MEDICATED_PAD | CUTANEOUS | Status: DC | PRN

## 2013-10-18 MED ORDER — BISACODYL 10 MG RE SUPP: 10.0000 mg | Freq: Every day | RECTAL | Status: DC | PRN

## 2013-10-18 MED ORDER — BENZOCAINE-MENTHOL 20-0.5 % EX AERO
1.0000 "application " | INHALATION_SPRAY | CUTANEOUS | Status: DC | PRN
  Administered 2013-10-18: 1 via TOPICAL
  Filled 2013-10-18: qty 56

## 2013-10-18 MED ORDER — FERROUS SULFATE 325 (65 FE) MG PO TABS
325.0000 mg | ORAL_TABLET | Freq: Two times a day (BID) | ORAL | Status: DC
  Administered 2013-10-18: 325 mg via ORAL
  Filled 2013-10-18 (×3): qty 1

## 2013-10-18 MED ORDER — OXYCODONE-ACETAMINOPHEN 5-325 MG PO TABS
2.0000 | ORAL_TABLET | ORAL | Status: DC | PRN
  Administered 2013-10-18 (×2): 2 via ORAL
  Filled 2013-10-18 (×4): qty 2

## 2013-10-18 MED ORDER — OXYCODONE-ACETAMINOPHEN 5-325 MG PO TABS
1.0000 | ORAL_TABLET | ORAL | Status: DC | PRN
  Administered 2013-10-18 – 2013-10-19 (×4): 1 via ORAL
  Filled 2013-10-18 (×4): qty 1

## 2013-10-18 MED ORDER — SENNOSIDES-DOCUSATE SODIUM 8.6-50 MG PO TABS
2.0000 | ORAL_TABLET | ORAL | Status: DC
  Administered 2013-10-18: 2 via ORAL
  Filled 2013-10-18: qty 2

## 2013-10-18 MED ORDER — PRENATAL MULTIVITAMIN CH
1.0000 | ORAL_TABLET | Freq: Every day | ORAL | Status: DC
  Administered 2013-10-18 – 2013-10-19 (×2): 1 via ORAL
  Filled 2013-10-18 (×2): qty 1

## 2013-10-18 MED ORDER — TETANUS-DIPHTH-ACELL PERTUSSIS 5-2.5-18.5 LF-MCG/0.5 IM SUSP: 0.5000 mL | Freq: Once | INTRAMUSCULAR | Status: DC

## 2013-10-18 MED ORDER — ONDANSETRON HCL 4 MG PO TABS: 4.0000 mg | ORAL_TABLET | ORAL | Status: DC | PRN

## 2013-10-18 MED ORDER — DIPHENHYDRAMINE HCL 25 MG PO CAPS: 25.0000 mg | ORAL_CAPSULE | Freq: Four times a day (QID) | ORAL | Status: DC | PRN

## 2013-10-18 MED ORDER — LANOLIN HYDROUS EX OINT: TOPICAL_OINTMENT | CUTANEOUS | Status: DC | PRN

## 2013-10-18 NOTE — Anesthesia Postprocedure Evaluation (Signed)
  Anesthesia Post-op Note    Anesthesia Post Note  Patient: Pamela Schaefer  Procedure(s) Performed: * No procedures listed *  Anesthesia type: Epidural  Patient location: Mother/Baby  Post pain: Pain level controlled  Post assessment: Post-op Vital signs reviewed  Last Vitals:  Filed Vitals:   10/18/13 0515  BP: 97/61  Pulse: 52  Temp: 36.8 C  Resp: 18    Post vital signs: Reviewed  Level of consciousness:alert  Complications: No apparent anesthesia complications

## 2013-10-18 NOTE — Progress Notes (Signed)
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       Limited Prenatal Care- Patient presented at 28 weeks for first prenatal visit in the MAU.  It is documented that she also had less than 3 visits.                                                                                      SOCIAL WORK ASSESSMENT/PLAN:  CSW and MSW intern met with MOB in her room 107 to explain CSW's role, offer support, and PPD education due to late/limited prenatal care. CSW and MSW intern entered room to find MOB in bed and baby sleeping bedside. MOB was welcoming and open to speaking with CSW. MOB reported feeling nauseated, but otherwise was in a pleasant mood.  She reported feeling relieved that the baby had been born.  CSW asked about what kept her from receiving regular prenatal care, MOB reported that transportation was a challenge and that she "prayed a lot that her baby would be healthy", and expressed gratitude that her baby is healthy.  CSW went on to explain hospital drug screen policy for women who have late/limited prenatal care, and MOB verbalized understanding.   CSW asked MOB about supports available, she disclosed that FOB was "doing the best he can for the children," and she was appreciative of that, however their relationship can be contentious at times. CSW asked MOB if she felt prepared at home for baby, she reported that she had all baby's basic needs. CSW asked MOB  about her emotions throughout this pregnancy, MOB disclosed that she was felt depressed for the majority of the pregnancy because she felt overwhelmed and financially unprepared to take care of a fourth child. MOB reported she did think about abortion, but had decided against it. CSW asked how MOB was feeling now that baby is here, MOB reported that she is happy about her decision.  During her pregnancy, MOB stated that she was tearful and experienced mood swings that resulted in her becoming easily agitated with the FOB.  CSW discussed increased risk for developing postpartum depression due to her mood symptoms and psychosocial stressors during her pregnancy. Upon further exploration, MOB  reported mild baby blues during her previous postpartum period.  MOB agreeable to contacting her MD if she experiences symptoms in the postpartum period.     No Further Intervention Required--No barriers to discharge.     PATIENT'S/FAMILY'S RESPONSE TO PLAN OF CARE: MOB expressed appreciation for the visit, and was understanding of the drug screen policy.

## 2013-10-18 NOTE — Progress Notes (Signed)
Patient nauseated and vomitting zofran given

## 2013-10-18 NOTE — Progress Notes (Signed)
CSW present and supervised the intervention completed by Haley Baker, MSW Intern, and is in agreement with the documentation completed by intern.   

## 2013-10-18 NOTE — Progress Notes (Deleted)
MSW intern and CSW met with MOB on 1st floor room 125. MSW intern and CSW met with MOB to describe CSW's role, offer support, PPD education and to complete assessment due to one baby's admission to the NICU. MSW intern and CSW entered room to find MOB in bed holding baby and FOB sleeping next to her. MOB seemed alert, responsive and welcoming. MOB's mood seemed upbeat and was very sociable. MSW intern asked MOB how she was coping with not only having twins but having one with her and the other in the NICU. MOB stated that she "has faith" and knows that the baby in the NICU will be coming home soon, yet she feels disconnected. MSW intern validated MOB's feelings of disconnection from baby in the NICU. MSW intern reminded MOB that throughout her baby's stay in the NICU she as the mother will be the one to get to know baby's personality and will become as close with her baby in the NICU as the baby in her arms. MOB reported that she will be visiting the NICU today 10/18/13 to begin feedings, MSW intern pointed out that feedings are a great opportunity to bond and will help her feel more connected to baby. MSW intern informed MOB that she may be able to bring the other baby with her so she can be holding both at once and for FOB to take some pictures. MOB seemed comforted by this suggestion. MSW intern asked MOB about who her supports were, MOB identified FOB, and that her mother is going to be living with her and FOB for the time being to help around the clock with the twins. MOB also identified her brother and FOB's family as good supports as well. MSW intern asked MOB if she felt prepared at home for all the needs of twins, MOB stated they're "too prepared." MSW intern addressed MOB's history of depression and explained that she was at a heightened risk for developing PPD. MOB is currently taking Prozac and states that she will continue to do so. MSW intern asked MOB about her knowledge regarding PPD, MOB responded  that she learned about it in some pregnancy classes that she took. MSW intern educated MOB on PPD symptoms to be aware of and resources available in case she did start to notice symptoms. No other psychosocial issues were identified as concerning. MOB seemed grateful for the information and CSW left contact information should she have any questions or needs.

## 2013-10-18 NOTE — H&P (Signed)
Attestation of Attending Supervision of Advanced Practitioner: Evaluation and management procedures were performed by the PA/NP/CNM/OB Fellow under my supervision/collaboration. Chart reviewed and agree with management and plan.  Dorell Gatlin V 10/18/2013 10:11 PM

## 2013-10-18 NOTE — Progress Notes (Signed)
Post Partum Day 1 Subjective: no complaints, up ad lib, voiding and tolerating PO, small lochia, plans to bottle feed, Depo-Provera  Objective: Blood pressure 97/61, pulse 52, temperature 98.2 F (36.8 C), temperature source Oral, resp. rate 18, height 5\' 3"  (1.6 m), weight 72.576 kg (160 lb), SpO2 97.00%, unknown if currently breastfeeding.  Physical Exam:  General: alert, cooperative and no distress Lochia:normal flow Chest: CTAB Heart: RRR no m/r/g Abdomen: +BS, soft, nontender,  Uterine Fundus: firm DVT Evaluation: No evidence of DVT seen on physical exam. Extremities: trace edema   Recent Labs  10/17/13 1555  HGB 10.3*  HCT 31.6*    Assessment/Plan: Plan for discharge tomorrow Social work consult:  No PNC   LOS: 1 day   CRESENZO-DISHMAN,Ikeisha Blumberg 10/18/2013, 7:30 AM

## 2013-10-19 MED ORDER — IBUPROFEN 600 MG PO TABS: 600.0000 mg | ORAL_TABLET | Freq: Four times a day (QID) | ORAL | Status: DC | PRN

## 2013-10-19 MED ORDER — PRENATAL MULTIVITAMIN CH: 1.0000 | ORAL_TABLET | Freq: Every day | ORAL | Status: DC

## 2013-10-19 NOTE — Discharge Instructions (Signed)

## 2013-10-19 NOTE — Plan of Care (Signed)
Problem: Discharge Progression Outcomes Goal: MMR given as ordered Outcome: Not Applicable Date Met:  74/08/14 Patient is rubella immune

## 2013-10-19 NOTE — Discharge Summary (Signed)
Obstetric Discharge Summary Reason for Admission: onset of labor/early labor Prenatal Procedures: none Intrapartum Procedures: spontaneous vaginal delivery Postpartum Procedures: none Complications-Operative and Postpartum: none Hemoglobin  Date Value Ref Range Status  10/17/2013 10.3* 12.0 - 15.0 g/dL Final     HCT  Date Value Ref Range Status  10/17/2013 31.6* 36.0 - 46.0 % Final   Pamela Schaefer is a 22yo [id number] admitted in the afternoon of 10/15 @ 38.1wks in early active labor. She did not have any PNC due to transportation issues and was dated by a 28wk scan. She received an epidural and progressed to SVD by that same evening of a VFI. By PPD#2 she is doing well and is deemed to have received the full benefit of her hospital stay. She had a SW consult while here that saw no barriers to d/c. UDS was neg. She is bottlefeeding and desires Depo for contraception.  Physical Exam:  General: alert, cooperative and no distress Heart: RRR Lungs: nl effort Lochia: appropriate Uterine Fundus: firm DVT Evaluation: No evidence of DVT seen on physical exam.  Discharge Diagnoses: Term Pregnancy-delivered  Discharge Information: Date: 10/19/2013 Activity: pelvic rest Diet: routine Medications: PNV and Ibuprofen Condition: stable Instructions: refer to practice specific booklet Discharge to: home Follow-up Information   [facility] Schedule an appointment as soon as possible for a visit in 6 weeks. (For your postpartum appointment. You may also schedule a time with this clinic to get a Depo Provera injection sooner than 6 wks if you would like.)    Contact information:   [address] [place] [id number] [phone]      Newborn Data: Live born female  Birth Weight: 6 lb 4 oz (2835 g) APGAR: 9, 9  Home with mother.  [name], [name] CNM 10/19/2013, 7:40 AM

## 2013-10-19 NOTE — Discharge Summary (Signed)
Attestation of Attending Supervision of Advanced Practitioner (PA/CNM/NP): Evaluation and management procedures were performed by the Advanced Practitioner under my supervision and collaboration.  I have reviewed the Advanced Practitioner's note and chart, and I agree with the management and plan.  Jacob Stinson, DO Attending Physician Faculty Practice, [facility]  

## 2013-10-21 NOTE — Progress Notes (Signed)
Post discharge chart review completed.  

## 2013-10-26 ENCOUNTER — Encounter (HOSPITAL_COMMUNITY): Payer: Self-pay

## 2013-10-26 ENCOUNTER — Inpatient Hospital Stay (HOSPITAL_COMMUNITY)
Admission: AD | Admit: 2013-10-26 | Discharge: 2013-10-26 | Disposition: A | Discharge status: Home / Self Care | Payer: Medicaid Other | Source: Ambulatory Visit | Attending: Obstetrics & Gynecology | Admitting: Obstetrics & Gynecology

## 2013-10-26 DIAGNOSIS — O9089 Other complications of the puerperium, not elsewhere classified: Secondary | ICD-10-CM | POA: Insufficient documentation

## 2013-10-26 DIAGNOSIS — Z87891 Personal history of nicotine dependence: Secondary | ICD-10-CM | POA: Diagnosis not present

## 2013-10-26 DIAGNOSIS — M5441 Lumbago with sciatica, right side: Secondary | ICD-10-CM

## 2013-10-26 DIAGNOSIS — R109 Unspecified abdominal pain: Secondary | ICD-10-CM | POA: Insufficient documentation

## 2013-10-26 LAB — URINALYSIS, ROUTINE W REFLEX MICROSCOPIC
BILIRUBIN URINE: NEGATIVE
GLUCOSE, UA: NEGATIVE mg/dL
Ketones, ur: NEGATIVE mg/dL
Nitrite: NEGATIVE
PH: 6 (ref 5.0–8.0)
Protein, ur: NEGATIVE mg/dL
UROBILINOGEN UA: 0.2 mg/dL (ref 0.0–1.0)

## 2013-10-26 LAB — CBC
HCT: 35 % — ABNORMAL LOW (ref 36.0–46.0)
Hemoglobin: 11.2 g/dL — ABNORMAL LOW (ref 12.0–15.0)
MCH: 27.8 pg (ref 26.0–34.0)
MCHC: 32 g/dL (ref 30.0–36.0)
MCV: 86.8 fL (ref 78.0–100.0)
Platelets: 307 10*3/uL (ref 150–400)
RBC: 4.03 MIL/uL (ref 3.87–5.11)
RDW: 15.3 % (ref 11.5–15.5)
WBC: 7.5 10*3/uL (ref 4.0–10.5)

## 2013-10-26 LAB — URINE MICROSCOPIC-ADD ON

## 2013-10-26 MED ORDER — OXYCODONE-ACETAMINOPHEN 5-325 MG PO TABS: 1.0000 | ORAL_TABLET | Freq: Four times a day (QID) | ORAL | Status: DC | PRN

## 2013-10-26 NOTE — MAU Note (Signed)
Patient states she has abdominal pain that makes it hard to move her legs that started 2 days ago. She also states she has chest pain.

## 2013-10-26 NOTE — MAU Provider Note (Signed)
History     CSN: [id number]  Arrival date and time: 10/26/13 1645   None     Chief Complaint  Patient presents with  . Postpartum Complications   HPI This is a [age] y.o. female who is about one week postpartum who presents with Schaefer/o crampy abdominal pain and some chest pain yesterday. States chest pain is gone now. Is mostly when she coughs. Bleeding is small. Is bottlefeeding. No problems with engorgement.   Rn note:  Patient states she has abdominal pain that makes it hard to move her legs that started 2 days ago. She also states she has chest pain.       OB History as of 10/26/13   Grav Para Term Preterm Abortions TAB SAB Ect Mult Living   5 4 4  0 1 0 1 0 0 4      Past Medical History  Diagnosis Date  . No pertinent past medical history   . Pregnancy induced hypertension   . PONV (postoperative nausea and vomiting)     Past Surgical History  Procedure Laterality Date  . Clavicle surgery      Family History  Problem Relation Age of Onset  . Diabetes Maternal Grandmother   . Hypertension Maternal Grandmother   . Heart disease Maternal Grandfather   . Kidney disease Maternal Grandfather     failure  . Heart disease Father     History  Substance Use Topics  . Smoking status: Former Smoker -- 0.10 packs/day    Types: Cigarettes  . Smokeless tobacco: Never Used     Comment: quit over year ago- 2013  . Alcohol Use: No    Allergies:  Allergies  Allergen Reactions  . Penicillins Itching    Prescriptions prior to admission  Medication Sig Dispense Refill  . ibuprofen (ADVIL,MOTRIN) 600 MG tablet Take 600 mg by mouth every 6 (six) hours as needed for moderate pain.      . Multiple Vitamins-Minerals (MULTIVITAMIN GUMMIES ADULT PO) Take 2 each by mouth daily.      . traMADol (ULTRAM) 50 MG tablet Take 50 mg by mouth 2 (two) times daily as needed for moderate pain.        Review of Systems  Constitutional: Negative for fever, chills and malaise/fatigue.   Cardiovascular: Negative for chest pain.  Gastrointestinal: Positive for abdominal pain. Negative for nausea, vomiting, diarrhea and constipation.  Genitourinary: Negative for dysuria.  Neurological: Negative for dizziness and headaches.  Psychiatric/Behavioral: Negative for depression.   Physical Exam   Blood pressure 108/78, pulse 61, temperature 98.3 F (36.8 Schaefer), temperature source Oral, resp. rate 16, height 5\' 3"  (1.6 m), unknown if currently breastfeeding.  Physical Exam  Constitutional: She is oriented to person, place, and time. She appears well-developed and well-nourished. No distress.  HENT:  Head: Normocephalic.  Cardiovascular: Normal rate.   Respiratory: Effort normal.  GI: Soft. She exhibits no distension and no mass. There is no tenderness. There is no rebound and no guarding.  Genitourinary: Vaginal discharge (small lochia) found.  Uterus nontender   Musculoskeletal: Normal range of motion.  Neurological: She is alert and oriented to person, place, and time.  Skin: Skin is warm and dry.  Psychiatric: She has a normal mood and affect.    MAU Course  Procedures  MDM Results for Pamela Schaefer, Pamela Schaefer (MRN [id number]) as of 10/29/2013 22:52  Ref. Range 10/26/2013 18:00  WBC Latest Range: 4.0-10.5 K/uL 7.5  RBC Latest Range: 3.87-5.11 MIL/uL 4.03  Hemoglobin Latest  Range: 12.0-15.0 g/dL 40.911.2 (L)  HCT Latest Range: 36.0-46.0 % 35.0 (L)  MCV Latest Range: 78.0-100.0 fL 86.8  MCH Latest Range: 26.0-34.0 pg 27.8  MCHC Latest Range: 30.0-36.0 g/dL 81.132.0  RDW Latest Range: 11.5-15.5 % 15.3  Platelets Latest Range: 150-400 K/uL 307   CBC reviewed, normal. No sign of infection  Felt better after Percocet.   Assessment and Plan  A:  Postpartum cramping      No respiratory issues       P:  Discharge home    Medication List         ibuprofen 600 MG tablet  Commonly known as:  ADVIL,MOTRIN  Take 600 mg by mouth every 6 (six) hours as needed for moderate pain.      MULTIVITAMIN GUMMIES ADULT PO  Take 2 each by mouth daily.     oxyCODONE-acetaminophen 5-325 MG per tablet  Commonly known as:  PERCOCET/ROXICET  Take 1 tablet by mouth every 6 (six) hours as needed.     traMADol 50 MG tablet  Commonly known as:  ULTRAM  Take 50 mg by mouth 2 (two) times daily as needed for moderate pain.           Followup in clinic in 2-3 weeks.   [facility] Center[facility]ILLI[facility],Zebulon Gantt 10/26/2013, 5:34 PM

## 2013-10-26 NOTE — Discharge Instructions (Signed)
Back Exercises °Back exercises help treat and prevent back injuries. The goal of back exercises is to increase the strength of your abdominal and back muscles and the flexibility of your back. These exercises should be started when you no longer have back pain. Back exercises include: °· Pelvic Tilt. Lie on your back with your knees bent. Tilt your pelvis until the lower part of your back is against the floor. Hold this position 5 to 10 sec and repeat 5 to 10 times. °· Knee to Chest. Pull first 1 knee up against your chest and hold for 20 to 30 seconds, repeat this with the other knee, and then both knees. This may be done with the other leg straight or bent, whichever feels better. °· Sit-Ups or Curl-Ups. Bend your knees 90 degrees. Start with tilting your pelvis, and do a partial, slow sit-up, lifting your trunk only 30 to 45 degrees off the floor. Take at least 2 to 3 seconds for each sit-up. Do not do sit-ups with your knees out straight. If partial sit-ups are difficult, simply do the above but with only tightening your abdominal muscles and holding it as directed. °· Hip-Lift. Lie on your back with your knees flexed 90 degrees. Push down with your feet and shoulders as you raise your hips a couple inches off the floor; hold for 10 seconds, repeat 5 to 10 times. °· Back arches. Lie on your stomach, propping yourself up on bent elbows. Slowly press on your hands, causing an arch in your low back. Repeat 3 to 5 times. Any initial stiffness and discomfort should lessen with repetition over time. °· Shoulder-Lifts. Lie face down with arms beside your body. Keep hips and torso pressed to floor as you slowly lift your head and shoulders off the floor. °Do not overdo your exercises, especially in the beginning. Exercises may cause you some mild back discomfort which lasts for a few minutes; however, if the pain is more severe, or lasts for more than 15 minutes, do not continue exercises until you see your caregiver.  Improvement with exercise therapy for back problems is slow.  °See your caregivers for assistance with developing a proper back exercise program. °Document Released: 01/28/2004 Document Revised: 03/14/2011 Document Reviewed: 10/21/2010 °ExitCare® Patient Information ©2015 [employer]. This information is not intended to replace advice given to you by your health care provider. Make sure you discuss any questions you have with your health care provider. ° °Back Pain, Adult °Back pain is very common. The pain often gets better over time. The cause of back pain is usually not dangerous. Most people can learn to manage their back pain on their own.  °HOME CARE  °· Stay active. Start with short walks on flat ground if you can. Try to walk farther each day. °· Do not sit, drive, or stand in one place for more than 30 minutes. Do not stay in bed. °· Do not avoid exercise or work. Activity can help your back heal faster. °· Be careful when you bend or lift an object. Bend at your knees, keep the object close to you, and do not twist. °· Sleep on a firm mattress. Lie on your side, and bend your knees. If you lie on your back, put a pillow under your knees. °· Only take medicines as told by your doctor. °· Put ice on the injured area. °¨ Put ice in a plastic bag. °¨ Place a towel between your skin and the bag. °¨ Leave the   ice on for 15-20 minutes, 03-04 times a day for the first 2 to 3 days. After that, you can switch between ice and heat packs. °· Ask your doctor about back exercises or massage. °· Avoid feeling anxious or stressed. Find good ways to deal with stress, such as exercise. °GET HELP RIGHT AWAY IF:  °· Your pain does not go away with rest or medicine. °· Your pain does not go away in 1 week. °· You have new problems. °· You do not feel well. °· The pain spreads into your legs. °· You cannot control when you poop (bowel movement) or pee (urinate). °· Your arms or legs feel weak or lose feeling  (numbness). °· You feel sick to your stomach (nauseous) or throw up (vomit). °· You have belly (abdominal) pain. °· You feel like you may pass out (faint). °MAKE SURE YOU:  °· Understand these instructions. °· Will watch your condition. °· Will get help right away if you are not doing well or get worse. °Document Released: 06/08/2007 Document Revised: 03/14/2011 Document Reviewed: 04/23/2013 °ExitCare® Patient Information ©2015 [employer]. This information is not intended to replace advice given to you by your health care provider. Make sure you discuss any questions you have with your health care provider. ° °

## 2013-11-04 ENCOUNTER — Encounter (HOSPITAL_COMMUNITY): Payer: Self-pay

## 2013-11-15 ENCOUNTER — Encounter: Payer: Self-pay | Admitting: Nurse Practitioner

## 2013-11-15 ENCOUNTER — Ambulatory Visit (INDEPENDENT_AMBULATORY_CARE_PROVIDER_SITE_OTHER): Payer: Medicaid Other | Admitting: Nurse Practitioner

## 2013-11-15 VITALS — BP 104/64 | HR 53 | Temp 98.2°F | Wt 144.4 lb

## 2013-11-15 DIAGNOSIS — F419 Anxiety disorder, unspecified: Secondary | ICD-10-CM | POA: Insufficient documentation

## 2013-11-15 DIAGNOSIS — Z309 Encounter for contraceptive management, unspecified: Secondary | ICD-10-CM | POA: Insufficient documentation

## 2013-11-15 DIAGNOSIS — N898 Other specified noninflammatory disorders of vagina: Secondary | ICD-10-CM

## 2013-11-15 DIAGNOSIS — Z30013 Encounter for initial prescription of injectable contraceptive: Secondary | ICD-10-CM

## 2013-11-15 LAB — POCT PREGNANCY, URINE: PREG TEST UR: NEGATIVE

## 2013-11-15 MED ORDER — SERTRALINE HCL 50 MG PO TABS: 50.0000 mg | ORAL_TABLET | Freq: Every day | ORAL | Status: DC

## 2013-11-15 MED ORDER — MEDROXYPROGESTERONE ACETATE 150 MG/ML IM SUSP
150.0000 mg | Freq: Once | INTRAMUSCULAR | Status: AC
  Administered 2013-11-15: 150 mg via INTRAMUSCULAR

## 2013-11-15 NOTE — Progress Notes (Signed)
Patient ID: Pamela MessingShanelle C Hosick, female   DOB: 11/02/1990, 23 y.o.   MRN: [id number] Subjective:     Pamela Schaefer is a 23 y.o. female who presents for a postpartum visit. She is 4 week postpartum following a spontaneous vaginal delivery. She actually received no prenatal care due to transportation issues. She states she " prayed" every night that her baby would be all right.  I have fully reviewed the prenatal and intrapartum course. The delivery was at [redacted] weeks gestational weeks. Outcome: spontaneous vaginal delivery. Anesthesia: epidural. Postpartum course has been low back pain and anxiety. The anxiety is an ongoing issue in her life and she may also have some low grade depression.  Baby's course has been colic/ milk issues. Baby is feeding by bottle - Similac Advance. Bleeding no bleeding. Bowel function is needs to take stool softer since delivery. Bladder function is normal. Patient is not sexually active. Contraception method is Depo-Provera injections. She would like the higher dose depo as she bleeds with depo.  Postpartum depression screening: negative.  The following portions of the patient's history were reviewed and updated as appropriate: current medications, past family history, past medical history, past social history, past surgical history and problem list.  Review of Systems Pertinent items are noted in HPI.   Objective:    BP 104/64 mmHg  Pulse 53  Temp(Src) 98.2 F (36.8 C)  Wt 144 lb 6.4 oz (65.499 kg)  Breastfeeding? No  General:  alert   Breasts:  inspection negative, no nipple discharge or bleeding, no masses or nodularity palpable  Lungs: clear to auscultation bilaterally  Heart:  regular rate and rhythm, S1, S2 normal, no murmur, click, rub or gallop and bradycardia  Abdomen: soft, non-tender; bowel sounds normal; no masses,  no organomegaly   Vulva:  normal  Vagina: vagina positive for thick yellow discharge  Cervix:  no cervical motion tenderness  Corpus:  normal size, contour, position, consistency, mobility, non-tender  Adnexa:  normal adnexa  Rectal Exam: Normal rectovaginal exam        Assessment:     postpartum exam. Pap smear not done at today's visit.  Anxiety Vaginal discharge Plan:    1. Contraception: Depo-Provera injections 150 mg ( patients choice ) 2. Zoloft 50 mg 1/2 tab at bedtime for 1-2 weeks then whole tab if needed  3. Wet prep/ will follow up with results if positive 4. Follow up in: 6 week or as needed.

## 2013-11-15 NOTE — Patient Instructions (Signed)
Contraceptive Barrier Methods A barrier method is a type of birth control (contraception) that is used to prevent pregnancy. These methods include:   Female condom.   Female condom.   Diaphragm.   Cervical cap.   Sponge.   Spermicide.  Your health care provider can help you decide what form of contraception is best for you. Always keep in mind the risks of sexually transmitted infections (STIs).  FEMALE CONDOM A female condom is a thin sheath (latex or rubber) that is worn over the penis during sexual intercourse. The condom prevents pregnancy by catching and stopping the sperm from reaching the uterus. Condoms may come with a spermicide on them, and they can only be worn once. Condoms should not be used with petroleum jelly, lotions, or oils. These things decrease their effectiveness. Condoms can be used with water-based lubricants. Condoms help protect against STIs. Latex and polyurethane condoms provide the best available protection against many STIs, including HIV.  FEMALE CONDOM The female condom is a soft, loose-fitting sheath that is put into the vagina before sexual intercourse. It prevents pregnancy by catching the sperm in the condom and blocking the passage of sperm to the uterus. It is intended for one-time use only. A female partner should not use a condom at the same time. The female and female condoms may stick together and break. A female condom can be inserted as long as 8 hours before intercourse. Condoms help protect against STIs.  DIAPHRAGM A diaphragm is a soft, latex, dome-shaped barrier that is placed in the vagina with spermicidal jelly before sexual intercourse. It covers the cervix, kills sperm, and blocks the passage of semen into the cervix. The diaphragm can be inserted up to 2 hours before sex. If it is inserted more than 2 hours before intercourse, then spermicide must be applied again. The diaphragm should be left in the vagina for 6-8 hours after intercourse. It must  be fitted by a health care provider. This method does not protect against STIs.  CERVICAL CAP A cervical cap is a round, soft, latex or plastic cup that is put in the vagina and fits over the cervix. It may be inserted as long as 6 hours before sexual activity. It must be left in place for at least 6 hours after intercourse and can be left in place for as long as 48 hours. It provides continuous protection as long as it is in place, regardless of the number of intercourse acts. The cervical cap cannot be used during your period. It must be fitted by a health care provider. Cervical caps do not protect against STIs. SPONGE A sponge is a soft, circular piece of polyurethane foam that has spermicide in it. The sponge has a loop for removal. It is inserted into the vagina after wetting it and is placed over the cervix before sexual intercourse. The foam is designed to trap and absorb sperm before it enters the cervix. The spermicide kills or immobilizes sperm. The sponge offers an immediate and continuous presence of spermicide throughout a 24-hour period regardless of the number of intercourse acts. The sponge should be left in place for at least 6 hours after sex. It should not be left in for more than 24 hours, and it cannot be reused. The sponge does not protect against STIs. SPERMICIDES Spermicides are chemicals that kill or block sperm from entering the cervix and uterus. They come in the form of creams, jellies, suppositories, foam, film, or tablets. The film, tablets, and   suppositories should be inserted 10 to 30 minutes before sexual intercourse so they can dissolve. They are inserted into the vagina with an applicator before having sexual intercourse. This must be repeated every time you have sexual intercourse. The use of spermicides does not protect against STIs. Document Released: 10/17/2006 Document Revised: 08/22/2012 Document Reviewed: 06/03/2012 ExitCare Patient Information 2015 [employer].  This information is not intended to replace advice given to you by your health care provider. Make sure you discuss any questions you have with your health care provider.  

## 2013-11-15 NOTE — Progress Notes (Signed)
Patient ID: Pamela MessingShanelle C Pinkham, female   DOB: 08/01/1990, 23 y.o.   MRN: [id number] Pt for postpartum visit, desires to start depo for birth control. Pt reports pain lower back and abdominal, intermittently.  Pts mother feels like she has postpartum depression due to anger.  Pt feels like something is running down her back when anxious.

## 2013-11-16 LAB — WET PREP, GENITAL
TRICH WET PREP: NONE SEEN
YEAST WET PREP: NONE SEEN

## 2014-02-10 ENCOUNTER — Ambulatory Visit: Payer: Medicaid Other | Admitting: Obstetrics & Gynecology

## 2014-03-06 ENCOUNTER — Ambulatory Visit: Payer: Self-pay | Admitting: Family

## 2014-04-04 ENCOUNTER — Ambulatory Visit: Payer: Self-pay | Admitting: Family Medicine

## 2014-04-28 ENCOUNTER — Ambulatory Visit (INDEPENDENT_AMBULATORY_CARE_PROVIDER_SITE_OTHER): Payer: Medicaid Other | Admitting: Family Medicine

## 2014-04-28 ENCOUNTER — Encounter: Payer: Self-pay | Admitting: Family Medicine

## 2014-04-28 VITALS — BP 106/70 | HR 66 | Ht 63.0 in

## 2014-04-28 DIAGNOSIS — Z3009 Encounter for other general counseling and advice on contraception: Secondary | ICD-10-CM

## 2014-04-28 DIAGNOSIS — Z309 Encounter for contraceptive management, unspecified: Secondary | ICD-10-CM | POA: Diagnosis not present

## 2014-04-28 NOTE — Patient Instructions (Signed)
Etonogestrel implant What is this medicine? ETONOGESTREL (et oh noe JES trel) is a contraceptive (birth control) device. It is used to prevent pregnancy. It can be used for up to 3 years. This medicine may be used for other purposes; ask your health care provider or pharmacist if you have questions. COMMON BRAND NAME(S): Implanon, Nexplanon What should I tell my health care provider before I take this medicine? They need to know if you have any of these conditions: -abnormal vaginal bleeding -blood vessel disease or blood clots -cancer of the breast, cervix, or liver -depression -diabetes -gallbladder disease -headaches -heart disease or recent heart attack -high blood pressure -high cholesterol -kidney disease -liver disease -renal disease -seizures -tobacco smoker -an unusual or allergic reaction to etonogestrel, other hormones, anesthetics or antiseptics, medicines, foods, dyes, or preservatives -pregnant or trying to get pregnant -breast-feeding How should I use this medicine? This device is inserted just under the skin on the inner side of your upper arm by a health care professional. Talk to your pediatrician regarding the use of this medicine in children. Special care may be needed. Overdosage: If you think you've taken too much of this medicine contact a poison control center or emergency room at once. Overdosage: If you think you have taken too much of this medicine contact a poison control center or emergency room at once. NOTE: This medicine is only for you. Do not share this medicine with others. What if I miss a dose? This does not apply. What may interact with this medicine? Do not take this medicine with any of the following medications: -amprenavir -bosentan -fosamprenavir This medicine may also interact with the following medications: -barbiturate medicines for inducing sleep or treating seizures -certain medicines for fungal infections like ketoconazole and  itraconazole -griseofulvin -medicines to treat seizures like carbamazepine, felbamate, oxcarbazepine, phenytoin, topiramate -modafinil -phenylbutazone -rifampin -some medicines to treat HIV infection like atazanavir, indinavir, lopinavir, nelfinavir, tipranavir, ritonavir -St. John's wort This list may not describe all possible interactions. Give your health care provider a list of all the medicines, herbs, non-prescription drugs, or dietary supplements you use. Also tell them if you smoke, drink alcohol, or use illegal drugs. Some items may interact with your medicine. What should I watch for while using this medicine? This product does not protect you against HIV infection (AIDS) or other sexually transmitted diseases. You should be able to feel the implant by pressing your fingertips over the skin where it was inserted. Tell your doctor if you cannot feel the implant. What side effects may I notice from receiving this medicine? Side effects that you should report to your doctor or health care professional as soon as possible: -allergic reactions like skin rash, itching or hives, swelling of the face, lips, or tongue -breast lumps -changes in vision -confusion, trouble speaking or understanding -dark urine -depressed mood -general ill feeling or flu-like symptoms -light-colored stools -loss of appetite, nausea -right upper belly pain -severe headaches -severe pain, swelling, or tenderness in the abdomen -shortness of breath, chest pain, swelling in a leg -signs of pregnancy -sudden numbness or weakness of the face, arm or leg -trouble walking, dizziness, loss of balance or coordination -unusual vaginal bleeding, discharge -unusually weak or tired -yellowing of the eyes or skin Side effects that usually do not require medical attention (Report these to your doctor or health care professional if they continue or are bothersome.): -acne -breast pain -changes in  weight -cough -fever or chills -headache -irregular menstrual bleeding -itching, burning, and   vaginal discharge -pain or difficulty passing urine -sore throat This list may not describe all possible side effects. Call your doctor for medical advice about side effects. You may report side effects to FDA at 1-800-FDA-1088. Where should I keep my medicine? This drug is given in a hospital or clinic and will not be stored at home. NOTE: This sheet is a summary. It may not cover all possible information. If you have questions about this medicine, talk to your doctor, pharmacist, or health care provider.  2015, Elsevier/Gold Standard. (2011-06-27 15:37:45)  

## 2014-04-28 NOTE — Progress Notes (Signed)
Pt states that she is here to followup from her postpartum visit. She is having some problems she wants to discuss with dr. Patient also wants to start depo shot but has had unprotected sex in the past week. Advised her to abstain from intercourse for two weeks prior to her next injection.

## 2014-04-28 NOTE — Progress Notes (Signed)
   Subjective:    Patient ID: Pamela Schaefer, female    DOB: 04/18/1990, 24 y.o.   MRN: [id number]  HPI Patient seen for contraception.  Had a dose of depo provera in November, but never returned for further injections.  Would like to switch to nexplanon.    Review of Systems  Constitutional: Negative for fever, chills and fatigue.  Genitourinary: Negative for dysuria, vaginal bleeding, vaginal discharge and vaginal pain.       Objective:   Physical Exam  Constitutional: She is oriented to person, place, and time. She appears well-developed and well-nourished.  Neurological: She is alert and oriented to person, place, and time.  Skin: Skin is warm and dry.  Psychiatric: She has a normal mood and affect. Her behavior is normal. Judgment and thought content normal.      Assessment & Plan:   Problem List Items Addressed This Visit    None    Visit Diagnoses    Encounter for counseling regarding contraception    -  Primary      Discussed contraception.  Will schedule in 2 weeks for insertion.  Discussed that should not have unprotected sex during this time.  Will obtain PAP at that time.

## 2014-05-01 ENCOUNTER — Emergency Department (HOSPITAL_COMMUNITY)
Admission: EM | Admit: 2014-05-01 | Discharge: 2014-05-01 | Disposition: A | Discharge status: Home / Self Care | Payer: Medicaid Other | Attending: Emergency Medicine | Admitting: Emergency Medicine

## 2014-05-01 ENCOUNTER — Encounter (HOSPITAL_COMMUNITY): Payer: Self-pay | Admitting: Emergency Medicine

## 2014-05-01 DIAGNOSIS — Z87891 Personal history of nicotine dependence: Secondary | ICD-10-CM | POA: Diagnosis not present

## 2014-05-01 DIAGNOSIS — K088 Other specified disorders of teeth and supporting structures: Secondary | ICD-10-CM | POA: Insufficient documentation

## 2014-05-01 DIAGNOSIS — H9209 Otalgia, unspecified ear: Secondary | ICD-10-CM | POA: Diagnosis not present

## 2014-05-01 DIAGNOSIS — Z79899 Other long term (current) drug therapy: Secondary | ICD-10-CM | POA: Insufficient documentation

## 2014-05-01 DIAGNOSIS — R51 Headache: Secondary | ICD-10-CM | POA: Diagnosis not present

## 2014-05-01 DIAGNOSIS — K0889 Other specified disorders of teeth and supporting structures: Secondary | ICD-10-CM

## 2014-05-01 DIAGNOSIS — J029 Acute pharyngitis, unspecified: Secondary | ICD-10-CM | POA: Diagnosis not present

## 2014-05-01 MED ORDER — IBUPROFEN 800 MG PO TABS: 800.0000 mg | ORAL_TABLET | Freq: Three times a day (TID) | ORAL | Status: DC

## 2014-05-01 NOTE — ED Notes (Signed)
Pt states that she has "8 teeth" left lower teeth, onset yesterday.

## 2014-05-01 NOTE — ED Provider Notes (Signed)
CSN: [id number]     Arrival date & time 05/01/14  1310 History  This chart was scribed for Pamela GladHeather Ramesses Crampton PA-C working with Marisa Severinlga Otter, MD by Elveria Risingimelie Horne, ED Scribe. This patient was seen in room TR04C/TR04C and the patient's care was started at 1:49 PM.   No chief complaint on file.  The history is provided by the patient. No language interpreter was used.   HPI Comments: Pamela Schaefer is a 24 y.o. female who presents to the Emergency Department complaining of aching left upper and lower dental pain, since yesterday. Patient reports mild sore throat, ear pain and headache. Patient reports treatment with ibuprofen, with some mild relief. Patient denies oral injury or trauma. Patient denies associated cold symptoms including rhinorrhea, congestion or sinus pressure. Patient states that she does experience some bleeding at her gumline with brushing.  Patient does not have a dentist.   Past Medical History  Diagnosis Date  . No pertinent past medical history   . Pregnancy induced hypertension   . PONV (postoperative nausea and vomiting)    Past Surgical History  Procedure Laterality Date  . Clavicle surgery     Family History  Problem Relation Age of Onset  . Diabetes Maternal Grandmother   . Hypertension Maternal Grandmother   . Heart disease Maternal Grandfather   . Kidney disease Maternal Grandfather     failure  . Heart disease Father    History  Substance Use Topics  . Smoking status: Former Smoker -- 0.10 packs/day    Types: Cigarettes  . Smokeless tobacco: Never Used     Comment: quit over year ago- 2013  . Alcohol Use: Yes   OB History    Gravida Para Term Preterm AB TAB SAB Ectopic Multiple Living   5 4 4  0 1 0 1 0 0 4     Review of Systems  Constitutional: Negative for fever.  HENT: Positive for dental problem. Negative for congestion and sinus pressure.   Neurological: Positive for headaches.      Allergies  Penicillins  Home Medications   Prior  to Admission medications   Medication Sig Start Date End Date Taking? Authorizing Provider  ibuprofen (ADVIL,MOTRIN) 600 MG tablet Take 600 mg by mouth every 6 (six) hours as needed for moderate pain.    Historical Provider, MD  Multiple Vitamins-Minerals (MULTIVITAMIN GUMMIES ADULT PO) Take 2 each by mouth daily.    Historical Provider, MD  oxyCODONE-acetaminophen (PERCOCET/ROXICET) 5-325 MG per tablet Take 1 tablet by mouth every 6 (six) hours as needed. Patient not taking: Reported on 04/28/2014 10/26/13   Aviva SignsMarie L Williams, CNM  sertraline (ZOLOFT) 50 MG tablet Take 1 tablet (50 mg total) by mouth daily. Patient not taking: Reported on 04/28/2014 11/15/13   Delbert PhenixLinda M Barefoot, NP  traMADol (ULTRAM) 50 MG tablet Take 50 mg by mouth 2 (two) times daily as needed for moderate pain.    Historical Provider, MD   Triage Vitals: BP 121/85 mmHg  Pulse 69  Temp(Src) 98.8 F (37.1 C) (Oral)  Resp 16  Ht 5\' 3"  (1.6 m)  Wt 140 lb (63.504 kg)  BMI 24.81 kg/m2  SpO2 100%  LMP 04/21/2014 (Approximate) Physical Exam  Constitutional: She is oriented to person, place, and time. She appears well-developed and well-nourished. No distress.  HENT:  Head: Normocephalic and atraumatic.  Right Ear: Tympanic membrane and ear canal normal.  Left Ear: Tympanic membrane and ear canal normal.  Mouth/Throat: Uvula is midline, oropharynx is clear and  moist and mucous membranes are normal.  No tenderness to palpation of upper and lower gingiva, fluctuance or trismus. No sublingual tenderness or swelling, no submental or submandibular lymphadenopathy. No tenderness to maxillary or frontal sinuses.   Eyes: EOM are normal.  Neck: Neck supple. No tracheal deviation present.  Cardiovascular: Normal rate, regular rhythm and normal heart sounds.   Pulmonary/Chest: Effort normal and breath sounds normal. No respiratory distress.  Musculoskeletal: Normal range of motion.  Neurological: She is alert and oriented to person,  place, and time.  Skin: Skin is warm and dry.  Psychiatric: She has a normal mood and affect. Her behavior is normal.  Nursing note and vitals reviewed.   ED Course  Procedures (including critical care time)  COORDINATION OF CARE: 1:58 PM- Will refer patient to dentist. Discussed treatment plan with patient at bedside and patient agreed to plan.   Labs Review Labs Reviewed - No data to display  Imaging Review No results found.   EKG Interpretation None      MDM   Final diagnoses:  None   Patient reports pain of the entire left upper gingiva and the left lower gingiva.  No signs of dental abscess or signs of dental infection.  Patient afebrile.  Exam unconcerning for Ludwig's angina or spread of infection.   No sinus tenderness or congestion to indicate sinus infection.  Urged patient to follow-up with dentist.      Pamela Glad, PA-C 05/01/14 1603  Marisa Severin, MD 05/01/14 [phone]

## 2014-05-12 ENCOUNTER — Ambulatory Visit (INDEPENDENT_AMBULATORY_CARE_PROVIDER_SITE_OTHER): Payer: Medicaid Other | Admitting: Family Medicine

## 2014-05-12 ENCOUNTER — Encounter: Payer: Self-pay | Admitting: Family Medicine

## 2014-05-12 ENCOUNTER — Other Ambulatory Visit (HOSPITAL_COMMUNITY)
Admission: RE | Admit: 2014-05-12 | Discharge: 2014-05-12 | Disposition: A | Discharge status: Home / Self Care | Payer: Medicaid Other | Source: Ambulatory Visit | Attending: Family Medicine | Admitting: Family Medicine

## 2014-05-12 VITALS — BP 124/71 | HR 62 | Temp 98.4°F | Wt 159.9 lb

## 2014-05-12 DIAGNOSIS — Z01812 Encounter for preprocedural laboratory examination: Secondary | ICD-10-CM

## 2014-05-12 DIAGNOSIS — Z113 Encounter for screening for infections with a predominantly sexual mode of transmission: Secondary | ICD-10-CM | POA: Insufficient documentation

## 2014-05-12 DIAGNOSIS — Z3202 Encounter for pregnancy test, result negative: Secondary | ICD-10-CM | POA: Diagnosis not present

## 2014-05-12 DIAGNOSIS — Z3042 Encounter for surveillance of injectable contraceptive: Secondary | ICD-10-CM

## 2014-05-12 DIAGNOSIS — Z0000 Encounter for general adult medical examination without abnormal findings: Secondary | ICD-10-CM

## 2014-05-12 DIAGNOSIS — Z01419 Encounter for gynecological examination (general) (routine) without abnormal findings: Secondary | ICD-10-CM | POA: Diagnosis not present

## 2014-05-12 LAB — POCT PREGNANCY, URINE: Preg Test, Ur: NEGATIVE

## 2014-05-12 MED ORDER — MEDROXYPROGESTERONE ACETATE 150 MG/ML IM SUSP
150.0000 mg | Freq: Once | INTRAMUSCULAR | Status: AC
  Administered 2014-05-12: 150 mg via INTRAMUSCULAR

## 2014-05-12 NOTE — Progress Notes (Signed)
   Subjective:    Patient ID: Pamela Schaefer, female    DOB: 07/02/1990, 24 y.o.   MRN: [id number]  HPI Here for PAP smear and contraception.  Would like Depo provera.  No other symptoms.   Review of Systems  Constitutional: Negative for fever, chills and fatigue.  Gastrointestinal: Negative for nausea, vomiting, abdominal pain and diarrhea.  Genitourinary: Negative for urgency, vaginal bleeding, vaginal discharge, vaginal pain and pelvic pain.   I have reviewed the patients past medical, family, and social history.  I have reviewed the patient's medication list and allergies.     Objective:   Physical Exam  Constitutional: She appears well-developed and well-nourished.  Abdominal: Soft. Bowel sounds are normal. She exhibits no distension and no mass. There is no tenderness. There is no rebound and no guarding. Hernia confirmed negative in the right inguinal area and confirmed negative in the left inguinal area.  Genitourinary: No labial fusion. There is no rash, tenderness, lesion or injury on the right labia. There is no rash, tenderness, lesion or injury on the left labia. Uterus is not deviated, not enlarged, not fixed and not tender. Cervix exhibits no motion tenderness, no discharge and no friability. Right adnexum displays no mass, no tenderness and no fullness. Left adnexum displays no mass, no tenderness and no fullness. No erythema, tenderness or bleeding in the vagina. No foreign body around the vagina. No signs of injury around the vagina. No vaginal discharge found.  Lymphadenopathy:       Right: No inguinal adenopathy present.       Left: No inguinal adenopathy present.  Skin: Skin is warm and dry.  Psychiatric: She has a normal mood and affect. Her behavior is normal. Judgment and thought content normal.      Assessment & Plan:   Problem List Items Addressed This Visit    None    Visit Diagnoses    Pre-procedure lab exam    -  Primary    Health maintenance  examination          PAP done today - will check GC/CT.  Depo today.

## 2014-05-12 NOTE — Progress Notes (Signed)
Patient here today for contraception and pap smear. Requests depo provera. Denies having any intercourse in the last two weeks. UPT obtained and negative.

## 2014-05-12 NOTE — Addendum Note (Signed)
Addended by: Gerome ApleyZEYFANG, Dyrell Tuccillo L on: 05/12/2014 03:19 PM   Modules accepted: Orders, Level of Service

## 2014-05-13 LAB — CYTOLOGY - PAP

## 2014-05-19 ENCOUNTER — Telehealth: Payer: Self-pay

## 2014-05-19 NOTE — Telephone Encounter (Signed)
-----   Message from Levie HeritageJacob J Stinson, DO sent at 05/17/2014  3:25 PM EDT ----- Normal PAP - please let pt know

## 2014-05-19 NOTE — Telephone Encounter (Signed)
Attempted to contact patient. No answer. Left message on mobile number stating results from last test done in clinic was normal, please call clinic if you have any questions or concerns. Called home phone-- family member answered-- asked that he inform patient nurse had left message on mobile voicemail and to call clinic with any questions or concerns.

## 2014-07-28 ENCOUNTER — Ambulatory Visit: Payer: Medicaid Other

## 2014-10-25 ENCOUNTER — Encounter (HOSPITAL_COMMUNITY)
Admission: EM | Disposition: A | Discharge status: Home / Self Care | Payer: Self-pay | Source: Home / Self Care | Attending: Emergency Medicine

## 2014-10-25 ENCOUNTER — Emergency Department (HOSPITAL_COMMUNITY): Payer: Medicaid Other | Admitting: Anesthesiology

## 2014-10-25 ENCOUNTER — Observation Stay (HOSPITAL_COMMUNITY)
Admission: EM | Admit: 2014-10-25 | Discharge: 2014-10-26 | Disposition: A | Discharge status: Home / Self Care | Payer: Medicaid Other | Attending: General Surgery | Admitting: General Surgery

## 2014-10-25 ENCOUNTER — Emergency Department (HOSPITAL_COMMUNITY): Payer: Medicaid Other

## 2014-10-25 ENCOUNTER — Encounter (HOSPITAL_COMMUNITY): Payer: Self-pay | Admitting: Nurse Practitioner

## 2014-10-25 DIAGNOSIS — F17210 Nicotine dependence, cigarettes, uncomplicated: Secondary | ICD-10-CM | POA: Diagnosis not present

## 2014-10-25 DIAGNOSIS — R1031 Right lower quadrant pain: Secondary | ICD-10-CM | POA: Diagnosis present

## 2014-10-25 DIAGNOSIS — Z791 Long term (current) use of non-steroidal anti-inflammatories (NSAID): Secondary | ICD-10-CM | POA: Diagnosis not present

## 2014-10-25 DIAGNOSIS — K3580 Unspecified acute appendicitis: Principal | ICD-10-CM | POA: Diagnosis present

## 2014-10-25 DIAGNOSIS — Z880 Allergy status to penicillin: Secondary | ICD-10-CM | POA: Diagnosis not present

## 2014-10-25 HISTORY — PX: LAPAROSCOPIC APPENDECTOMY: SHX408

## 2014-10-25 LAB — WET PREP, GENITAL
Clue Cells Wet Prep HPF POC: NONE SEEN
TRICH WET PREP: NONE SEEN
YEAST WET PREP: NONE SEEN

## 2014-10-25 LAB — COMPREHENSIVE METABOLIC PANEL
ALBUMIN: 4.5 g/dL (ref 3.5–5.0)
ALK PHOS: 61 U/L (ref 38–126)
ALT: 15 U/L (ref 14–54)
ANION GAP: 6 (ref 5–15)
AST: 19 U/L (ref 15–41)
BUN: 11 mg/dL (ref 6–20)
CALCIUM: 9.6 mg/dL (ref 8.9–10.3)
CHLORIDE: 106 mmol/L (ref 101–111)
CO2: 25 mmol/L (ref 22–32)
Creatinine, Ser: 0.76 mg/dL (ref 0.44–1.00)
GFR calc non Af Amer: >60 mL/min (ref >60)
GLUCOSE: 118 mg/dL — AB (ref 65–99)
POTASSIUM: 3.9 mmol/L (ref 3.5–5.1)
SODIUM: 137 mmol/L (ref 135–145)
TOTAL PROTEIN: 7.9 g/dL (ref 6.5–8.1)
Total Bilirubin: 0.6 mg/dL (ref 0.3–1.2)

## 2014-10-25 LAB — CBC WITH DIFFERENTIAL/PLATELET
BASOS PCT: 0 %
Basophils Absolute: 0 10*3/uL (ref 0.0–0.1)
EOS ABS: 0 10*3/uL (ref 0.0–0.7)
Eosinophils Relative: 0 %
HCT: 36.7 % (ref 36.0–46.0)
Hemoglobin: 12 g/dL (ref 12.0–15.0)
Lymphocytes Relative: 9 %
Lymphs Abs: 1.2 10*3/uL (ref 0.7–4.0)
MCH: 28.1 pg (ref 26.0–34.0)
MCHC: 32.7 g/dL (ref 30.0–36.0)
MCV: 85.9 fL (ref 78.0–100.0)
MONO ABS: 0.2 10*3/uL (ref 0.1–1.0)
MONOS PCT: 2 %
NEUTROS PCT: 89 %
Neutro Abs: 11.3 10*3/uL — ABNORMAL HIGH (ref 1.7–7.7)
PLATELETS: 240 10*3/uL (ref 150–400)
RBC: 4.27 MIL/uL (ref 3.87–5.11)
RDW: 14.5 % (ref 11.5–15.5)
WBC: 12.7 10*3/uL — ABNORMAL HIGH (ref 4.0–10.5)

## 2014-10-25 LAB — URINALYSIS, ROUTINE W REFLEX MICROSCOPIC
Bilirubin Urine: NEGATIVE
Glucose, UA: NEGATIVE mg/dL
HGB URINE DIPSTICK: NEGATIVE
Ketones, ur: 15 mg/dL — AB
Leukocytes, UA: NEGATIVE
Nitrite: NEGATIVE
PROTEIN: NEGATIVE mg/dL
Specific Gravity, Urine: 1.031 — ABNORMAL HIGH (ref 1.005–1.030)
UROBILINOGEN UA: 0.2 mg/dL (ref 0.0–1.0)
pH: 6 (ref 5.0–8.0)

## 2014-10-25 LAB — CBC
HCT: 63.5 % — ABNORMAL HIGH (ref 36.0–46.0)
HEMOGLOBIN: 12.4 g/dL (ref 12.0–15.0)
MCH: 28.1 pg (ref 26.0–34.0)
MCHC: 19.5 g/dL — AB (ref 30.0–36.0)
MCV: 143.7 fL — ABNORMAL HIGH (ref 78.0–100.0)
Platelets: 220 10*3/uL (ref 150–400)
RBC: 4.42 MIL/uL (ref 3.87–5.11)
RDW: 14.3 % (ref 11.5–15.5)
WBC: 13.6 10*3/uL — AB (ref 4.0–10.5)

## 2014-10-25 LAB — I-STAT BETA HCG BLOOD, ED (MC, WL, AP ONLY)

## 2014-10-25 SURGERY — APPENDECTOMY, LAPAROSCOPIC
Anesthesia: General | Site: Abdomen

## 2014-10-25 MED ORDER — HYDROMORPHONE HCL 1 MG/ML IJ SOLN
INTRAMUSCULAR | Status: AC
  Filled 2014-10-25: qty 1

## 2014-10-25 MED ORDER — CIPROFLOXACIN IN D5W 400 MG/200ML IV SOLN
400.0000 mg | Freq: Once | INTRAVENOUS | Status: AC
  Administered 2014-10-25: 400 mg via INTRAVENOUS
  Filled 2014-10-25 (×2): qty 200

## 2014-10-25 MED ORDER — NEOSTIGMINE METHYLSULFATE 10 MG/10ML IV SOLN
INTRAVENOUS | Status: AC
Start: 2014-10-25 — End: 2014-10-25
  Filled 2014-10-25: qty 1

## 2014-10-25 MED ORDER — LACTATED RINGERS IV SOLN
INTRAVENOUS | Status: DC | PRN
  Administered 2014-10-25 (×2): via INTRAVENOUS

## 2014-10-25 MED ORDER — IOHEXOL 300 MG/ML  SOLN
100.0000 mL | Freq: Once | INTRAMUSCULAR | Status: AC | PRN
Start: 2014-10-25 — End: 2014-10-25
  Administered 2014-10-25: 100 mL via INTRAVENOUS

## 2014-10-25 MED ORDER — MIDAZOLAM HCL 5 MG/5ML IJ SOLN
INTRAMUSCULAR | Status: DC | PRN
  Administered 2014-10-25: 2 mg via INTRAVENOUS

## 2014-10-25 MED ORDER — ONDANSETRON HCL 4 MG/2ML IJ SOLN: 4.0000 mg | Freq: Once | INTRAMUSCULAR | Status: DC | PRN

## 2014-10-25 MED ORDER — LIDOCAINE HCL (CARDIAC) 20 MG/ML IV SOLN
INTRAVENOUS | Status: AC
  Filled 2014-10-25: qty 10

## 2014-10-25 MED ORDER — SODIUM CHLORIDE 0.9 % IV BOLUS (SEPSIS)
1000.0000 mL | Freq: Once | INTRAVENOUS | Status: AC
  Administered 2014-10-25: 1000 mL via INTRAVENOUS

## 2014-10-25 MED ORDER — METRONIDAZOLE IN NACL 5-0.79 MG/ML-% IV SOLN
500.0000 mg | Freq: Once | INTRAVENOUS | Status: AC
  Administered 2014-10-25: 500 mg via INTRAVENOUS
  Filled 2014-10-25: qty 100

## 2014-10-25 MED ORDER — PROPOFOL 10 MG/ML IV BOLUS
INTRAVENOUS | Status: DC | PRN
  Administered 2014-10-25: 180 mg via INTRAVENOUS

## 2014-10-25 MED ORDER — KCL IN DEXTROSE-NACL 20-5-0.45 MEQ/L-%-% IV SOLN
INTRAVENOUS | Status: DC
  Administered 2014-10-26: via INTRAVENOUS
  Filled 2014-10-25: qty 1000

## 2014-10-25 MED ORDER — GLYCOPYRROLATE 0.2 MG/ML IJ SOLN
INTRAMUSCULAR | Status: AC
  Filled 2014-10-25: qty 3

## 2014-10-25 MED ORDER — BUPIVACAINE-EPINEPHRINE 0.25% -1:200000 IJ SOLN
INTRAMUSCULAR | Status: DC | PRN
  Administered 2014-10-25: 10 mL

## 2014-10-25 MED ORDER — MORPHINE SULFATE (PF) 4 MG/ML IV SOLN: 4.0000 mg | Freq: Once | INTRAVENOUS | Status: DC

## 2014-10-25 MED ORDER — LIDOCAINE HCL (CARDIAC) 20 MG/ML IV SOLN
INTRAVENOUS | Status: DC | PRN
  Administered 2014-10-25: 50 mg via INTRAVENOUS

## 2014-10-25 MED ORDER — VANCOMYCIN HCL IN DEXTROSE 1-5 GM/200ML-% IV SOLN
INTRAVENOUS | Status: AC
Start: 2014-10-25 — End: 2014-10-25
  Administered 2014-10-25: 300 mg via INTRAVENOUS
  Filled 2014-10-25: qty 200

## 2014-10-25 MED ORDER — ONDANSETRON HCL 4 MG/2ML IJ SOLN
INTRAMUSCULAR | Status: AC
  Filled 2014-10-25: qty 2

## 2014-10-25 MED ORDER — SUCCINYLCHOLINE CHLORIDE 20 MG/ML IJ SOLN
INTRAMUSCULAR | Status: AC
  Filled 2014-10-25: qty 2

## 2014-10-25 MED ORDER — EPHEDRINE SULFATE 50 MG/ML IJ SOLN
INTRAMUSCULAR | Status: AC
  Filled 2014-10-25: qty 1

## 2014-10-25 MED ORDER — ETOMIDATE 2 MG/ML IV SOLN
INTRAVENOUS | Status: AC
Start: 2014-10-25 — End: 2014-10-25
  Filled 2014-10-25: qty 10

## 2014-10-25 MED ORDER — FENTANYL CITRATE (PF) 250 MCG/5ML IJ SOLN
INTRAMUSCULAR | Status: AC
  Filled 2014-10-25: qty 5

## 2014-10-25 MED ORDER — OXYCODONE HCL 5 MG PO TABS: 5.0000 mg | ORAL_TABLET | Freq: Once | ORAL | Status: DC | PRN

## 2014-10-25 MED ORDER — ARTIFICIAL TEARS OP OINT
TOPICAL_OINTMENT | OPHTHALMIC | Status: AC
  Filled 2014-10-25: qty 3.5

## 2014-10-25 MED ORDER — FENTANYL CITRATE (PF) 250 MCG/5ML IJ SOLN
INTRAMUSCULAR | Status: DC | PRN
  Administered 2014-10-25: 100 ug via INTRAVENOUS
  Administered 2014-10-25: 50 ug via INTRAVENOUS

## 2014-10-25 MED ORDER — SUCCINYLCHOLINE CHLORIDE 20 MG/ML IJ SOLN
INTRAMUSCULAR | Status: DC | PRN
  Administered 2014-10-25: 100 mg via INTRAVENOUS

## 2014-10-25 MED ORDER — NEOSTIGMINE METHYLSULFATE 10 MG/10ML IV SOLN
INTRAVENOUS | Status: DC | PRN
  Administered 2014-10-25: 4 mg via INTRAVENOUS

## 2014-10-25 MED ORDER — OXYCODONE HCL 5 MG/5ML PO SOLN
5.0000 mg | Freq: Once | ORAL | Status: DC | PRN
Start: 2014-10-25 — End: 2014-10-25

## 2014-10-25 MED ORDER — MORPHINE SULFATE (PF) 4 MG/ML IV SOLN
4.0000 mg | Freq: Once | INTRAVENOUS | Status: AC
  Administered 2014-10-25: 4 mg via INTRAVENOUS
  Filled 2014-10-25: qty 1

## 2014-10-25 MED ORDER — PROPOFOL 10 MG/ML IV BOLUS
INTRAVENOUS | Status: AC
  Filled 2014-10-25: qty 20

## 2014-10-25 MED ORDER — ROCURONIUM BROMIDE 50 MG/5ML IV SOLN
INTRAVENOUS | Status: AC
Start: 2014-10-25 — End: 2014-10-25
  Filled 2014-10-25: qty 1

## 2014-10-25 MED ORDER — ROCURONIUM BROMIDE 100 MG/10ML IV SOLN
INTRAVENOUS | Status: DC | PRN
  Administered 2014-10-25: 30 mg via INTRAVENOUS

## 2014-10-25 MED ORDER — BUPIVACAINE-EPINEPHRINE (PF) 0.25% -1:200000 IJ SOLN
INTRAMUSCULAR | Status: AC
  Filled 2014-10-25: qty 30

## 2014-10-25 MED ORDER — STERILE WATER FOR INJECTION IJ SOLN
INTRAMUSCULAR | Status: AC
  Filled 2014-10-25: qty 10

## 2014-10-25 MED ORDER — ONDANSETRON HCL 4 MG/2ML IJ SOLN
4.0000 mg | Freq: Once | INTRAMUSCULAR | Status: AC
  Administered 2014-10-25: 4 mg via INTRAVENOUS
  Filled 2014-10-25: qty 2

## 2014-10-25 MED ORDER — HYDROMORPHONE HCL 1 MG/ML IJ SOLN
0.2500 mg | INTRAMUSCULAR | Status: DC | PRN
  Administered 2014-10-25 (×2): 0.5 mg via INTRAVENOUS

## 2014-10-25 MED ORDER — MIDAZOLAM HCL 2 MG/2ML IJ SOLN
INTRAMUSCULAR | Status: AC
  Filled 2014-10-25: qty 4

## 2014-10-25 MED ORDER — GLYCOPYRROLATE 0.2 MG/ML IJ SOLN
INTRAMUSCULAR | Status: DC | PRN
  Administered 2014-10-25: 0.6 mg via INTRAVENOUS

## 2014-10-25 MED ORDER — ONDANSETRON HCL 4 MG/2ML IJ SOLN
INTRAMUSCULAR | Status: DC | PRN
  Administered 2014-10-25: 4 mg via INTRAVENOUS

## 2014-10-25 MED ORDER — GI COCKTAIL ~~LOC~~
30.0000 mL | Freq: Once | ORAL | Status: AC
  Administered 2014-10-25: 30 mL via ORAL
  Filled 2014-10-25: qty 30

## 2014-10-25 SURGICAL SUPPLY — 39 items
APPLIER CLIP ROT 10 11.4 M/L (STAPLE)
APR CLP MED LRG 11.4X10 (STAPLE)
BAG SPEC RTRVL LRG 6X4 10 (ENDOMECHANICALS) ×1
CANISTER SUCTION 2500CC (MISCELLANEOUS) ×3 IMPLANT
CHLORAPREP W/TINT 26ML (MISCELLANEOUS) ×3 IMPLANT
CLIP APPLIE ROT 10 11.4 M/L (STAPLE) IMPLANT
COVER SURGICAL LIGHT HANDLE (MISCELLANEOUS) ×3 IMPLANT
CUTTER FLEX LINEAR 45M (STAPLE) ×3 IMPLANT
ELECT REM PT RETURN 9FT ADLT (ELECTROSURGICAL) ×3
ELECTRODE REM PT RTRN 9FT ADLT (ELECTROSURGICAL) ×1 IMPLANT
ENDOLOOP SUT PDS II  0 18 (SUTURE)
ENDOLOOP SUT PDS II 0 18 (SUTURE) IMPLANT
GLOVE BIOGEL M STRL SZ7.5 (GLOVE) ×3 IMPLANT
GLOVE BIOGEL PI IND STRL 8 (GLOVE) ×1 IMPLANT
GLOVE BIOGEL PI INDICATOR 8 (GLOVE) ×2
GOWN STRL REUS W/ TWL LRG LVL3 (GOWN DISPOSABLE) ×2 IMPLANT
GOWN STRL REUS W/ TWL XL LVL3 (GOWN DISPOSABLE) ×1 IMPLANT
GOWN STRL REUS W/TWL LRG LVL3 (GOWN DISPOSABLE) ×6
GOWN STRL REUS W/TWL XL LVL3 (GOWN DISPOSABLE) ×3
KIT BASIN OR (CUSTOM PROCEDURE TRAY) ×3 IMPLANT
KIT ROOM TURNOVER OR (KITS) ×3 IMPLANT
LIQUID BAND (GAUZE/BANDAGES/DRESSINGS) ×2 IMPLANT
PAD ARMBOARD 7.5X6 YLW CONV (MISCELLANEOUS) ×6 IMPLANT
POUCH SPECIMEN RETRIEVAL 10MM (ENDOMECHANICALS) ×3 IMPLANT
RELOAD 45 VASCULAR/THIN (ENDOMECHANICALS) ×3 IMPLANT
RELOAD STAPLE 45 2.5 WHT GRN (ENDOMECHANICALS) IMPLANT
SCALPEL HARMONIC ACE (MISCELLANEOUS) ×3 IMPLANT
SCISSORS LAP 5X35 DISP (ENDOMECHANICALS) IMPLANT
SET IRRIG TUBING LAPAROSCOPIC (IRRIGATION / IRRIGATOR) ×3 IMPLANT
SPECIMEN JAR SMALL (MISCELLANEOUS) ×3 IMPLANT
SUT MNCRL AB 4-0 PS2 18 (SUTURE) ×3 IMPLANT
SUT VICRYL 0 UR6 27IN ABS (SUTURE) IMPLANT
TOWEL OR 17X24 6PK STRL BLUE (TOWEL DISPOSABLE) ×3 IMPLANT
TOWEL OR 17X26 10 PK STRL BLUE (TOWEL DISPOSABLE) ×3 IMPLANT
TRAY FOLEY CATH 16FR SILVER (SET/KITS/TRAYS/PACK) ×3 IMPLANT
TRAY LAPAROSCOPIC MC (CUSTOM PROCEDURE TRAY) ×3 IMPLANT
TROCAR XCEL BLADELESS 5X75MML (TROCAR) ×6 IMPLANT
TROCAR XCEL BLUNT TIP 100MML (ENDOMECHANICALS) ×3 IMPLANT
TUBING INSUFFLATION (TUBING) ×3 IMPLANT

## 2014-10-25 NOTE — ED Provider Notes (Signed)
CSN: [id number]     Arrival date & time 10/25/14  1520 History   First MD Initiated Contact with Patient 10/25/14 1541     Chief Complaint  Patient presents with  . Abdominal Pain     (Consider location/radiation/quality/duration/timing/severity/associated sxs/prior Treatment) Patient is a 24 y.o. female presenting with abdominal pain. The history is provided by the patient.  Abdominal Pain Pain location:  Generalized Pain quality: aching and dull   Pain radiates to:  RLQ Pain severity:  Severe Onset quality:  Gradual Duration:  1 day Timing:  Intermittent Progression:  Waxing and waning Chronicity:  Recurrent Context: not alcohol use, not diet changes, not eating, not laxative use, not medication withdrawal, not previous surgeries, not recent illness, not recent sexual activity, not sick contacts and not suspicious food intake   Relieved by:  None tried Worsened by:  Palpation and movement Ineffective treatments:  Lying down Associated symptoms: anorexia and chills   Associated symptoms: no chest pain, no constipation, no diarrhea, no dysuria, no fever, no melena, no nausea, no shortness of breath, no vaginal bleeding, no vaginal discharge and no vomiting   Risk factors: no alcohol abuse, has not had multiple surgeries, not obese and not pregnant     Past Medical History  Diagnosis Date  . No pertinent past medical history   . Pregnancy induced hypertension   . PONV (postoperative nausea and vomiting)    Past Surgical History  Procedure Laterality Date  . Clavicle surgery     Family History  Problem Relation Age of Onset  . Diabetes Maternal Grandmother   . Hypertension Maternal Grandmother   . Heart disease Maternal Grandfather   . Kidney disease Maternal Grandfather     failure  . Heart disease Father    Social History  Substance Use Topics  . Smoking status: Current Every Day Smoker -- 0.10 packs/day    Types: Cigarettes  . Smokeless tobacco: Never Used      Comment: quit over year ago- 2013  . Alcohol Use: Yes     Comment: social   OB History    Gravida Para Term Preterm AB TAB SAB Ectopic Multiple Living   5 4 4  0 1 0 1 0 0 4     Review of Systems  Constitutional: Positive for chills. Negative for fever.  HENT: Negative for facial swelling.   Respiratory: Negative for shortness of breath.   Cardiovascular: Negative for chest pain.  Gastrointestinal: Positive for abdominal pain and anorexia. Negative for nausea, vomiting, diarrhea, constipation and melena.  Genitourinary: Negative for dysuria, vaginal bleeding and vaginal discharge.  Musculoskeletal: Negative for back pain.  Skin: Negative for rash.  Neurological: Negative for headaches.  Psychiatric/Behavioral: Negative for confusion.      Allergies  Penicillins  Home Medications   Prior to Admission medications   Medication Sig Start Date End Date Taking? Authorizing Provider  naproxen sodium (ANAPROX) 220 MG tablet Take 660 mg by mouth daily as needed (for pain).   Yes Historical Provider, MD  docusate sodium (COLACE) 100 MG capsule Take 1 capsule (100 mg total) by mouth 2 (two) times daily. 10/26/14   Romie LeveeAlicia Thomas, MD  oxyCODONE-acetaminophen (PERCOCET/ROXICET) 5-325 MG tablet Take 1-2 tablets by mouth every 6 (six) hours as needed. 10/26/14   Romie LeveeAlicia Thomas, MD   BP 111/81 mmHg  Pulse 53  Temp(Src) 98.1 F (36.7 C) (Oral)  Resp 16  Ht 5\' 3"  (1.6 m)  Wt 165 lb (74.844 kg)  BMI 29.24 kg/m2  SpO2 100%  LMP 10/18/2014 Physical Exam  Constitutional: She is oriented to person, place, and time. She appears well-developed and well-nourished. No distress.  HENT:  Head: Normocephalic and atraumatic.  Right Ear: External ear normal.  Left Ear: External ear normal.  Nose: Nose normal.  Mouth/Throat: Oropharynx is clear and moist. No oropharyngeal exudate.  Eyes: Conjunctivae and EOM are normal. Pupils are equal, round, and reactive to light. Right eye exhibits no discharge.  Left eye exhibits no discharge. No scleral icterus.  Neck: Normal range of motion. Neck supple. No JVD present. No tracheal deviation present. No thyromegaly present.  Cardiovascular: Normal rate, regular rhythm and intact distal pulses.   Pulmonary/Chest: Effort normal and breath sounds normal. No stridor. No respiratory distress. She has no wheezes. She has no rales. She exhibits no tenderness.  Abdominal: Soft. She exhibits no distension. There is tenderness in the right lower quadrant. There is rebound and tenderness at McBurney's point. There is no guarding and negative Murphy's sign.  Genitourinary: Vagina normal. There is no rash on the right labia. There is no rash on the left labia. Uterus is not tender. Cervix exhibits no motion tenderness. Right adnexum displays no tenderness. Left adnexum displays no tenderness. No erythema or tenderness in the vagina. No vaginal discharge found.  Musculoskeletal: Normal range of motion. She exhibits no edema or tenderness.  Lymphadenopathy:    She has no cervical adenopathy.  Neurological: She is alert and oriented to person, place, and time.  Skin: Skin is warm and dry. No rash noted. She is not diaphoretic. No erythema. No pallor.  Psychiatric: She has a normal mood and affect. Her behavior is normal. Judgment and thought content normal.  Nursing note and vitals reviewed.   ED Course  Procedures (including critical care time) Labs Review Labs Reviewed  WET PREP, GENITAL - Abnormal; Notable for the following:    WBC, Wet Prep HPF POC FEW (*)    All other components within normal limits  COMPREHENSIVE METABOLIC PANEL - Abnormal; Notable for the following:    Glucose, Bld 118 (*)    All other components within normal limits  CBC - Abnormal; Notable for the following:    WBC 13.6 (*)    HCT 63.5 (*)    MCV 143.7 (*)    MCHC 19.5 (*)    All other components within normal limits  URINALYSIS, ROUTINE W REFLEX MICROSCOPIC (NOT AT [facility]) -  Abnormal; Notable for the following:    Specific Gravity, Urine 1.031 (*)    Ketones, ur 15 (*)    All other components within normal limits  CBC WITH DIFFERENTIAL/PLATELET - Abnormal; Notable for the following:    WBC 12.7 (*)    Neutro Abs 11.3 (*)    All other components within normal limits  CBC WITH DIFFERENTIAL/PLATELET  I-STAT BETA HCG BLOOD, ED ([facility], [facility], [facility] ONLY)  GC/CHLAMYDIA PROBE AMP ([place]) NOT AT [facility]  SURGICAL PATHOLOGY    Imaging Review Ct Abdomen Pelvis W Contrast  10/25/2014  CLINICAL DATA:  Generalized right lower quadrant abdominal pain for several weeks. Painful intercourse 3 weeks ago. Appendicitis versus right ovarian cyst? EXAM: CT ABDOMEN AND PELVIS WITH CONTRAST TECHNIQUE: Multidetector CT imaging of the abdomen and pelvis was performed using the standard protocol following bolus administration of intravenous contrast. CONTRAST:  OMNIPAQUE IOHEXOL 300 MG/ML  SOLN COMPARISON:  None. FINDINGS: Appendix is distended to approximately 12 mm. Walls of the appendix are markedly thickened throughout. Small amount of periappendiceal  inflammation noted. No free fluid or abscess collection within the right lower quadrant. Mildly prominent lymph nodes in the right lower quadrant mesentery are likely reactive in nature. Liver, gallbladder, spleen, pancreas, and adrenal glands appear normal. Kidneys are normal without stone or hydronephrosis. Large and small bowel are normal in caliber. Abdominal aorta is normal in caliber. No osseous abnormality seen. Incidental note is made of a small periumbilical abdominal wall hernia which contains fat only. IMPRESSION: 1. Acute appendicitis. Appendix distended to approximately 12 mm. Appendiceal walls are prominently thickened throughout. Associated mild periappendiceal fluid stranding. No periappendiceal abscess collection or free intraperitoneal air. 2. Remainder of the abdomen and pelvis CT is unremarkable. These results were called by  telephone at the time of interpretation on 10/25/2014 at 8:29 pm to Dr. Gavin Pound , who verbally acknowledged these results. Electronically Signed   By: Bary Richard M.D.   On: 10/25/2014 20:31   I have personally reviewed and evaluated these images and lab results as part of my medical decision-making.    MDM   Final diagnoses:  RLQ abdominal pain    Pt with intermittent RLQ pain.  Waxing/waning for 1 week.  Now worsening today.  Elevated WBC count on labs concerning for appendicitis.  GU exam unremarkable.  RLQ TTP.  CT obtained c/w acute appendicitis.  Pain controlled with IVF, antiemetics, and pain medications in ED.  Pt discussed with surgery and will be admitted for Lap appy.  Patient care was discussed with my attending, Dr. Radford Pax.       Gavin Pound, MD 10/26/14 1206  Nelva Nay, MD 10/31/14 [phone]

## 2014-10-25 NOTE — ED Notes (Signed)
Pelvic cart set up at bedside  

## 2014-10-25 NOTE — ED Notes (Addendum)
She woke this am with generalized abd pain, she thought it was gas, took pepto bismol and zantac with no relief. Denies n/v/bowel/bladder changes. She reports painful sexual intercourse 3 weeks ago, has not had sex since, denies vaginal discharge.

## 2014-10-25 NOTE — Anesthesia Preprocedure Evaluation (Addendum)
Anesthesia Evaluation  Patient identified by MRN, date of birth, ID band Patient awake    Reviewed: Allergy & Precautions, NPO status , Patient's Chart, lab work & pertinent test results  History of Anesthesia Complications (+) PONV  Airway Mallampati: II   Neck ROM: Full    Dental  (+) Teeth Intact, Dental Advisory Given   Pulmonary Current Smoker,    breath sounds clear to auscultation       Cardiovascular hypertension,  Rhythm:Regular Rate:Normal     Neuro/Psych    GI/Hepatic   Endo/Other    Renal/GU      Musculoskeletal   Abdominal   Peds  Hematology   Anesthesia Other Findings   Reproductive/Obstetrics                            Anesthesia Physical Anesthesia Plan  ASA: II and emergent  Anesthesia Plan: General   Post-op Pain Management:    Induction: Intravenous, Cricoid pressure planned and Rapid sequence  Airway Management Planned: Oral ETT  Additional Equipment:   Intra-op Plan:   Post-operative Plan: Extubation in OR  Informed Consent: I have reviewed the patients History and Physical, chart, labs and discussed the procedure including the risks, benefits and alternatives for the proposed anesthesia with the patient or authorized representative who has indicated his/her understanding and acceptance.   Dental advisory given  Plan Discussed with: CRNA, Anesthesiologist and Surgeon  Anesthesia Plan Comments:        Anesthesia Quick Evaluation

## 2014-10-25 NOTE — ED Notes (Signed)
Pt returned from CT °

## 2014-10-25 NOTE — H&P (Signed)
Pamela Schaefer is an 24 y.o. female.   Chief Complaint: abd pain HPI: 24 yo AAF doing well yesterday who awoke with generalized abdominal pain early this am. The pain worsened throughout the morning prompting her to come to the ED. Constant. No prior symptoms. Pain now only RLQ. Some nausea. No f/c/v. Reg BM early this am. No dysuria/discharge. No prior abd surgeries.   Smokes about 2 cig per day. Denies drugs.   Past Medical History  Diagnosis Date  . No pertinent past medical history   . Pregnancy induced hypertension   . PONV (postoperative nausea and vomiting)     Past Surgical History  Procedure Laterality Date  . Clavicle surgery      Family History  Problem Relation Age of Onset  . Diabetes Maternal Grandmother   . Hypertension Maternal Grandmother   . Heart disease Maternal Grandfather   . Kidney disease Maternal Grandfather     failure  . Heart disease Father    Social History:  reports that she has been smoking Cigarettes.  She has been smoking about 0.10 packs per day. She has never used smokeless tobacco. She reports that she drinks alcohol. She reports that she does not use illicit drugs.  Allergies:  Allergies  Allergen Reactions  . Penicillins Itching     (Not in a hospital admission)  Results for orders placed or performed during the hospital encounter of 10/25/14 (from the past 48 hour(s))  Comprehensive metabolic panel     Status: Abnormal   Collection Time: 10/25/14  3:55 PM  Result Value Ref Range   Sodium 137 135 - 145 mmol/L   Potassium 3.9 3.5 - 5.1 mmol/L   Chloride 106 101 - 111 mmol/L   CO2 25 22 - 32 mmol/L   Glucose, Bld 118 (H) 65 - 99 mg/dL   BUN 11 6 - 20 mg/dL   Creatinine, Ser 0.76 0.44 - 1.00 mg/dL   Calcium 9.6 8.9 - 10.3 mg/dL   Total Protein 7.9 6.5 - 8.1 g/dL   Albumin 4.5 3.5 - 5.0 g/dL   AST 19 15 - 41 U/L   ALT 15 14 - 54 U/L   Alkaline Phosphatase 61 38 - 126 U/L   Total Bilirubin 0.6 0.3 - 1.2 mg/dL   GFR calc non  Af Amer >60 >60 mL/min   GFR calc Af Amer >60 >60 mL/min    Comment: (NOTE) The eGFR has been calculated using the CKD EPI equation. This calculation has not been validated in all clinical situations. eGFR's persistently <60 mL/min signify possible Chronic Kidney Disease.    Anion gap 6 5 - 15  CBC     Status: Abnormal   Collection Time: 10/25/14  3:55 PM  Result Value Ref Range   WBC 13.6 (H) 4.0 - 10.5 K/uL   RBC 4.42 3.87 - 5.11 MIL/uL   Hemoglobin 12.4 12.0 - 15.0 g/dL   HCT 63.5 (H) 36.0 - 46.0 %   MCV 143.7 (H) 78.0 - 100.0 fL   MCH 28.1 26.0 - 34.0 pg   MCHC 19.5 (L) 30.0 - 36.0 g/dL   RDW 14.3 11.5 - 15.5 %   Platelets 220 150 - 400 K/uL  Urinalysis, Routine w reflex microscopic (not at [facility])     Status: Abnormal   Collection Time: 10/25/14  3:59 PM  Result Value Ref Range   Color, Urine YELLOW YELLOW   APPearance CLEAR CLEAR   Specific Gravity, Urine 1.031 (H) 1.005 - 1.030  pH 6.0 5.0 - 8.0   Glucose, UA NEGATIVE NEGATIVE mg/dL   Hgb urine dipstick NEGATIVE NEGATIVE   Bilirubin Urine NEGATIVE NEGATIVE   Ketones, ur 15 (A) NEGATIVE mg/dL   Protein, ur NEGATIVE NEGATIVE mg/dL   Urobilinogen, UA 0.2 0.0 - 1.0 mg/dL   Nitrite NEGATIVE NEGATIVE   Leukocytes, UA NEGATIVE NEGATIVE    Comment: MICROSCOPIC NOT DONE ON URINES WITH NEGATIVE PROTEIN, BLOOD, LEUKOCYTES, NITRITE, OR GLUCOSE <1000 mg/dL.  I-Stat beta hCG blood, ED ([facility], [facility], [facility] only)     Status: None   Collection Time: 10/25/14  4:07 PM  Result Value Ref Range   I-stat hCG, quantitative <5.0 <5 mIU/mL   Comment 3            Comment:   GEST. AGE      CONC.  (mIU/mL)   <=1 WEEK        5 - 50     2 WEEKS       50 - 500     3 WEEKS       100 - 10,000     4 WEEKS     1,000 - 30,000        FEMALE AND NON-PREGNANT FEMALE:     LESS THAN 5 mIU/mL   Wet prep, genital     Status: Abnormal   Collection Time: 10/25/14  7:00 PM  Result Value Ref Range   Yeast Wet Prep HPF POC NONE SEEN NONE SEEN   Trich, Wet Prep  NONE SEEN NONE SEEN   Clue Cells Wet Prep HPF POC NONE SEEN NONE SEEN   WBC, Wet Prep HPF POC FEW (A) NONE SEEN  CBC with Differential/Platelet     Status: Abnormal   Collection Time: 10/25/14  7:02 PM  Result Value Ref Range   WBC 12.7 (H) 4.0 - 10.5 K/uL   RBC 4.27 3.87 - 5.11 MIL/uL   Hemoglobin 12.0 12.0 - 15.0 g/dL   HCT 36.7 36.0 - 46.0 %   MCV 85.9 78.0 - 100.0 fL    Comment: REPEATED TO VERIFY RESULTS VERIFIED VIA RECOLLECT    MCH 28.1 26.0 - 34.0 pg   MCHC 32.7 30.0 - 36.0 g/dL   RDW 14.5 11.5 - 15.5 %   Platelets 240 150 - 400 K/uL   Neutrophils Relative % 89 %   Neutro Abs 11.3 (H) 1.7 - 7.7 K/uL   Lymphocytes Relative 9 %   Lymphs Abs 1.2 0.7 - 4.0 K/uL   Monocytes Relative 2 %   Monocytes Absolute 0.2 0.1 - 1.0 K/uL   Eosinophils Relative 0 %   Eosinophils Absolute 0.0 0.0 - 0.7 K/uL   Basophils Relative 0 %   Basophils Absolute 0.0 0.0 - 0.1 K/uL   Ct Abdomen Pelvis W Contrast  10/25/2014  CLINICAL DATA:  Generalized right lower quadrant abdominal pain for several weeks. Painful intercourse 3 weeks ago. Appendicitis versus right ovarian cyst? EXAM: CT ABDOMEN AND PELVIS WITH CONTRAST TECHNIQUE: Multidetector CT imaging of the abdomen and pelvis was performed using the standard protocol following bolus administration of intravenous contrast. CONTRAST:  161m OMNIPAQUE IOHEXOL 300 MG/ML  SOLN COMPARISON:  None. FINDINGS: Appendix is distended to approximately 12 mm. Walls of the appendix are markedly thickened throughout. Small amount of periappendiceal inflammation noted. No free fluid or abscess collection within the right lower quadrant. Mildly prominent lymph nodes in the right lower quadrant mesentery are likely reactive in nature. Liver, gallbladder, spleen, pancreas, and adrenal glands  appear normal. Kidneys are normal without stone or hydronephrosis. Large and small bowel are normal in caliber. Abdominal aorta is normal in caliber. No osseous abnormality seen.  Incidental note is made of a small periumbilical abdominal wall hernia which contains fat only. IMPRESSION: 1. Acute appendicitis. Appendix distended to approximately 12 mm. Appendiceal walls are prominently thickened throughout. Associated mild periappendiceal fluid stranding. No periappendiceal abscess collection or free intraperitoneal air. 2. Remainder of the abdomen and pelvis CT is unremarkable. These results were called by telephone at the time of interpretation on 10/25/2014 at 8:29 pm to Dr. Hoyle Sauer , who verbally acknowledged these results. Electronically Signed   By: Franki Cabot M.D.   On: 10/25/2014 20:31    Review of Systems  Constitutional: Negative for fever, chills and weight loss.  HENT: Negative for nosebleeds.   Eyes: Negative for blurred vision.  Respiratory: Negative for shortness of breath.   Cardiovascular: Negative for chest pain, palpitations, orthopnea and PND.       Denies DOE  Gastrointestinal: Positive for nausea and abdominal pain. Negative for heartburn, diarrhea, constipation and blood in stool.  Genitourinary: Negative for dysuria and hematuria.  Musculoskeletal: Negative.   Skin: Negative for itching and rash.  Neurological: Negative for dizziness, focal weakness, seizures, loss of consciousness and headaches.       Denies TIAs, amaurosis fugax  Endo/Heme/Allergies: Does not bruise/bleed easily.  Psychiatric/Behavioral: The patient is not nervous/anxious.     Blood pressure 100/57, pulse 54, temperature 98.1 F (36.7 C), temperature source Oral, resp. rate 16, height 5' 3" (1.6 m), weight 74.844 kg (165 lb), last menstrual period 10/18/2014, SpO2 100 %, not currently breastfeeding. Physical Exam  Vitals reviewed. Constitutional: She is oriented to person, place, and time. She appears well-developed and well-nourished. No distress.  HENT:  Head: Normocephalic and atraumatic.  Right Ear: External ear normal.  Left Ear: External ear normal.  Eyes:  Conjunctivae are normal. No scleral icterus.  Neck: Normal range of motion. Neck supple. No tracheal deviation present. No thyromegaly present.  Cardiovascular: Normal rate and normal heart sounds.   Respiratory: Effort normal and breath sounds normal. No stridor. No respiratory distress. She has no wheezes.  GI: Soft. She exhibits no distension. There is tenderness in the right lower quadrant. There is no rebound and no guarding.    Doughy abdomen  Musculoskeletal: She exhibits no edema or tenderness.  Lymphadenopathy:    She has no cervical adenopathy.  Neurological: She is alert and oriented to person, place, and time. She exhibits normal muscle tone.  Skin: Skin is warm and dry. No rash noted. She is not diaphoretic. No erythema. No pallor.  Psychiatric: She has a normal mood and affect. Her behavior is normal. Judgment and thought content normal.     Assessment/Plan Acute appendicitis We discussed the etiology and management of acute appendicitis. We discussed operative and nonoperative management.  I recommended operative management along with IV antibiotics.  We discussed laparoscopic appendectomy. We discussed the risk and benefits of surgery including but not limited to bleeding, infection, injury to surrounding structures, need to convert to an open procedure, blood clot formation, post operative abscess or wound infection, staple line complications such as leak or bleeding, hernia formation, post operative ileus, need for additional procedures, anesthesia complications, and the typical postoperative course. I explained that the patient should expect a good improvement in their symptoms.  To OR Given PCN allergy will give cipro/flagyl  Leighton Ruff. Redmond Pulling, MD, FACS General, Bariatric, &  Minimally Invasive Surgery [facility] [place]    [facility] M 10/25/2014, 9:25 PM

## 2014-10-25 NOTE — Transfer of Care (Signed)
Immediate Anesthesia Transfer of Care Note  Patient: Pamela MessingShanelle C Holstine  Procedure(s) Performed: Procedure(s): APPENDECTOMY LAPAROSCOPIC (N/A)  Patient Location: PACU  Anesthesia Type:General  Level of Consciousness: sedated  Airway & Oxygen Therapy: Patient Spontanous Breathing and Patient connected to face mask oxygen  Post-op Assessment: Report given to RN and Post -op Vital signs reviewed and stable  Post vital signs: Reviewed and stable  Last Vitals:  Filed Vitals:   10/25/14 2045  BP: 100/57  Pulse: 54  Temp:   Resp:     Complications: No apparent anesthesia complications

## 2014-10-25 NOTE — Op Note (Signed)
Pamela Schaefer [id number] 1990/03/14 10/25/2014  Appendectomy, Lap, Procedure Note  Indications: The patient presented with a history of right-sided abdominal pain. A CT revealed findings consistent with acute appendicitis.  Pre-operative Diagnosis: acute appendicitis  Post-operative Diagnosis: Same  Surgeon: Atilano Ina   Assistants: none  Anesthesia: General endotracheal anesthesia  ASA Class: 2, E  Procedure Details  The patient was seen again in the Holding Room. The risks, benefits, complications, treatment options, and expected outcomes were discussed with the patient and/or family. The possibilities of perforation of viscus, bleeding, recurrent infection, the need for additional procedures, failure to diagnose a condition, and creating a complication requiring transfusion or operation were discussed. There was concurrence with the proposed plan and informed consent was obtained. The site of surgery was properly noted. The patient was taken to Operating Room, identified as Pamela Schaefer and the procedure verified as Appendectomy. A Time Out was held and the above information confirmed. She received Cipro and Flagyl given her PCN allergy.   The patient was placed in the supine position and general anesthesia was induced, along with placement of orogastric tube, SCDs, and a Foley catheter. The abdomen was prepped and draped in a sterile fashion. A 1.5 centimeter infraumbilical incision was made.  The umbilical stalk was elevated, and the midline fascia was incised with a #11 blade.  A Kelly clamp was used to confirm entrance into the peritoneal cavity.  A pursestring suture was passed around the incision with a 0 Vicryl.  A 12mm Hasson was introduced into the abdomen and the tails of the suture were used to hold the Hasson in place.   The pneumoperitoneum was then established to steady pressure of 15 mmHg.  Additional 5 mm cannulas then placed in the left lower quadrant of the  abdomen and the suprapubic region under direct visualization. A careful evaluation of the entire abdomen was carried out. The patient was placed in Trendelenburg and left lateral decubitus position. The small intestines were retracted in the cephalad and left lateral direction away from the pelvis and right lower quadrant. The patient was found to have an inflammed appendix that was extending into the pelvis. There was no evidence of perforation.  The appendix was carefully dissected. The appendix was was skeletonized with the harmonic scalpel.   The appendix was divided at its base using an endo-GIA stapler with a 45mm white load. No appendiceal stump was left in place. The appendix was removed from the abdomen with an Endocatch bag through the umbilical port.  There was no evidence of bleeding, leakage, or complication after division of the appendix. Irrigation was also performed and irrigate suctioned from the abdomen as well.  The umbilical port site was closed with the purse string suture. The closure was viewed laparoscopically. There was no residual palpable fascial defect.  The trocar site skin wounds were closed with 4-0 Monocryl. Liquidband exceed was applied to the skin incisions.  Instrument, sponge, and needle counts were correct at the conclusion of the case.   Findings: The appendix was found to be inflamed. There were not signs of necrosis.  There was not perforation. There was not abscess formation.  Estimated Blood Loss:  Minimal         Drains: none         Specimens: appendix         Complications:  None; patient tolerated the procedure well.         Disposition: PACU - hemodynamically stable.  Condition: stable  Pamela Schaefer. Andrey Campanile, MD, FACS General, Bariatric, & Minimally Invasive Surgery [facility], [place]

## 2014-10-25 NOTE — Anesthesia Procedure Notes (Signed)
Procedure Name: Intubation Date/Time: 10/25/2014 10:53 PM Performed by: Brien MatesMAHONY, Francella Barnett D Pre-anesthesia Checklist: Patient identified, Emergency Drugs available, Patient being monitored, Suction available and Timeout performed Patient Re-evaluated:Patient Re-evaluated prior to inductionOxygen Delivery Method: Circle system utilized Preoxygenation: Pre-oxygenation with 100% oxygen Intubation Type: IV induction, Rapid sequence and Cricoid Pressure applied Laryngoscope Size: Miller and 2 Grade View: Grade I Tube type: Oral Tube size: 7.0 mm Number of attempts: 1 Airway Equipment and Method: Stylet Placement Confirmation: ETT inserted through vocal cords under direct vision,  positive ETCO2 and breath sounds checked- equal and bilateral Secured at: 20 cm Tube secured with: Tape Dental Injury: Teeth and Oropharynx as per pre-operative assessment

## 2014-10-26 ENCOUNTER — Other Ambulatory Visit: Payer: Self-pay | Admitting: General Surgery

## 2014-10-26 MED ORDER — ONDANSETRON HCL 4 MG/2ML IJ SOLN
4.0000 mg | Freq: Four times a day (QID) | INTRAMUSCULAR | Status: DC | PRN
  Administered 2014-10-26: 4 mg via INTRAVENOUS
  Filled 2014-10-26: qty 2

## 2014-10-26 MED ORDER — ACETAMINOPHEN 325 MG PO TABS: 650.0000 mg | ORAL_TABLET | Freq: Four times a day (QID) | ORAL | Status: DC | PRN

## 2014-10-26 MED ORDER — MORPHINE SULFATE (PF) 2 MG/ML IV SOLN
1.0000 mg | INTRAVENOUS | Status: DC | PRN
  Administered 2014-10-26: 2 mg via INTRAVENOUS
  Administered 2014-10-26: 4 mg via INTRAVENOUS
  Administered 2014-10-26: 2 mg via INTRAVENOUS
  Filled 2014-10-26: qty 1
  Filled 2014-10-26: qty 2
  Filled 2014-10-26: qty 1

## 2014-10-26 MED ORDER — ONDANSETRON 4 MG PO TBDP
4.0000 mg | ORAL_TABLET | Freq: Four times a day (QID) | ORAL | Status: DC | PRN
  Administered 2014-10-26: 4 mg via ORAL
  Filled 2014-10-26: qty 1

## 2014-10-26 MED ORDER — PANTOPRAZOLE SODIUM 40 MG IV SOLR
40.0000 mg | Freq: Every day | INTRAVENOUS | Status: DC
  Filled 2014-10-26: qty 40

## 2014-10-26 MED ORDER — DOCUSATE SODIUM 100 MG PO CAPS: 100.0000 mg | ORAL_CAPSULE | Freq: Two times a day (BID) | ORAL | Status: DC

## 2014-10-26 MED ORDER — ACETAMINOPHEN 650 MG RE SUPP: 650.0000 mg | Freq: Four times a day (QID) | RECTAL | Status: DC | PRN

## 2014-10-26 MED ORDER — DIPHENHYDRAMINE HCL 12.5 MG/5ML PO ELIX: 12.5000 mg | ORAL_SOLUTION | Freq: Four times a day (QID) | ORAL | Status: DC | PRN

## 2014-10-26 MED ORDER — OXYCODONE-ACETAMINOPHEN 5-325 MG PO TABS: 1.0000 | ORAL_TABLET | Freq: Four times a day (QID) | ORAL | Status: DC | PRN

## 2014-10-26 MED ORDER — OXYCODONE HCL 5 MG PO TABS
5.0000 mg | ORAL_TABLET | ORAL | Status: DC | PRN
  Administered 2014-10-26: 10 mg via ORAL
  Filled 2014-10-26: qty 2

## 2014-10-26 MED ORDER — DIPHENHYDRAMINE HCL 50 MG/ML IJ SOLN: 12.5000 mg | Freq: Four times a day (QID) | INTRAMUSCULAR | Status: DC | PRN

## 2014-10-26 MED ORDER — ENOXAPARIN SODIUM 40 MG/0.4ML ~~LOC~~ SOLN
40.0000 mg | SUBCUTANEOUS | Status: DC
  Administered 2014-10-26: 40 mg via SUBCUTANEOUS
  Filled 2014-10-26: qty 0.4

## 2014-10-26 MED ORDER — ZOLPIDEM TARTRATE 5 MG PO TABS: 5.0000 mg | ORAL_TABLET | Freq: Every evening | ORAL | Status: DC | PRN

## 2014-10-26 MED ORDER — METHOCARBAMOL 500 MG PO TABS: 500.0000 mg | ORAL_TABLET | Freq: Four times a day (QID) | ORAL | Status: DC | PRN

## 2014-10-26 MED ORDER — SIMETHICONE 80 MG PO CHEW: 40.0000 mg | CHEWABLE_TABLET | Freq: Four times a day (QID) | ORAL | Status: DC | PRN

## 2014-10-26 MED ORDER — PROMETHAZINE HCL 25 MG/ML IJ SOLN
12.5000 mg | Freq: Four times a day (QID) | INTRAMUSCULAR | Status: DC | PRN
  Administered 2014-10-26: 12.5 mg via INTRAVENOUS
  Filled 2014-10-26: qty 1

## 2014-10-26 NOTE — Discharge Instructions (Signed)

## 2014-10-26 NOTE — Progress Notes (Signed)
1 Day Post-Op Lap Appy Subjective: Nausea last night but better this am.  Ambulating, working on getting pain controlled  Objective: Vital signs in last 24 hours: Temp:  [97.3 F (36.3 C)-98.8 F (37.1 C)] 98.8 F (37.1 C) (10/23 0545) Pulse Rate:  [53-86] 65 (10/23 0545) Resp:  [16-22] 18 (10/23 0545) BP: (95-153)/(57-95) 96/59 mmHg (10/23 0545) SpO2:  [100 %] 100 % (10/23 0545) Weight:  [74.844 kg (165 lb)] 74.844 kg (165 lb) (10/22 1534)   Intake/Output from previous day: 10/22 0701 - 10/23 0700 In: 1720.8 [I.V.:1720.8] Out: 825 [Urine:800; Blood:25] Intake/Output this shift:     General appearance: alert and cooperative GI: soft, appropriately tender  Incision: no significant drainage  Lab Results:   Recent Labs  10/25/14 1555 10/25/14 1902  WBC 13.6* 12.7*  HGB 12.4 12.0  HCT 63.5* 36.7  PLT 220 240   BMET  Recent Labs  10/25/14 1555  NA 137  K 3.9  CL 106  CO2 25  GLUCOSE 118*  BUN 11  CREATININE 0.76  CALCIUM 9.6   PT/INR No results for input(s): LABPROT, INR in the last 72 hours. ABG No results for input(s): PHART, HCO3 in the last 72 hours.  Invalid input(s): PCO2, PO2  MEDS, Scheduled . enoxaparin (LOVENOX) injection  40 mg Subcutaneous Q24H  . HYDROmorphone      . pantoprazole (PROTONIX) IV  40 mg Intravenous QHS    Studies/Results: Ct Abdomen Pelvis W Contrast  10/25/2014  CLINICAL DATA:  Generalized right lower quadrant abdominal pain for several weeks. Painful intercourse 3 weeks ago. Appendicitis versus right ovarian cyst? EXAM: CT ABDOMEN AND PELVIS WITH CONTRAST TECHNIQUE: Multidetector CT imaging of the abdomen and pelvis was performed using the standard protocol following bolus administration of intravenous contrast. CONTRAST:  100mL OMNIPAQUE IOHEXOL 300 MG/ML  SOLN COMPARISON:  None. FINDINGS: Appendix is distended to approximately 12 mm. Walls of the appendix are markedly thickened throughout. Small amount of periappendiceal  inflammation noted. No free fluid or abscess collection within the right lower quadrant. Mildly prominent lymph nodes in the right lower quadrant mesentery are likely reactive in nature. Liver, gallbladder, spleen, pancreas, and adrenal glands appear normal. Kidneys are normal without stone or hydronephrosis. Large and small bowel are normal in caliber. Abdominal aorta is normal in caliber. No osseous abnormality seen. Incidental note is made of a small periumbilical abdominal wall hernia which contains fat only. IMPRESSION: 1. Acute appendicitis. Appendix distended to approximately 12 mm. Appendiceal walls are prominently thickened throughout. Associated mild periappendiceal fluid stranding. No periappendiceal abscess collection or free intraperitoneal air. 2. Remainder of the abdomen and pelvis CT is unremarkable. These results were called by telephone at the time of interpretation on 10/25/2014 at 8:29 pm to Dr. Gavin PoundJUSTIN BROOTEN , who verbally acknowledged these results. Electronically Signed   By: Bary RichardStan  Maynard M.D.   On: 10/25/2014 20:31    Assessment: s/p Procedure(s): APPENDECTOMY LAPAROSCOPIC Patient Active Problem List   Diagnosis Date Noted  . Acute appendicitis 10/25/2014  . Anxiety 11/15/2013  . Postpartum care and examination 11/15/2013  . Contraception management 11/15/2013  . Insufficient prenatal care 08/07/2013  . GBS (group B Streptococcus carrier), +RV culture, currently pregnant 12/03/2012  . GERD without esophagitis 07/31/2012    S/p lap appendectomy  Plan: Advance diet  PO pain meds Possible d/c later today       .Vanita PandaAlicia C Lavilla Delamora, MD [facility], [place] [phone]   10/26/2014 9:47 AM

## 2014-10-26 NOTE — Anesthesia Postprocedure Evaluation (Signed)
  Anesthesia Post-op Note  Patient: Pamela Schaefer  Procedure(s) Performed: Procedure(s): APPENDECTOMY LAPAROSCOPIC (N/A)  Patient Location: PACU  Anesthesia Type:General  Level of Consciousness: awake, alert  and oriented  Airway and Oxygen Therapy: Patient Spontanous Breathing  Post-op Pain: mild  Post-op Assessment: Post-op Vital signs reviewed, Patient's Cardiovascular Status Stable, Respiratory Function Stable, Patent Airway and Pain level controlled              Post-op Vital Signs: stable  Last Vitals:  Filed Vitals:   10/26/14 0545  BP: 96/59  Pulse: 65  Temp: 37.1 C  Resp: 18    Complications: No apparent anesthesia complications

## 2014-10-27 ENCOUNTER — Encounter (HOSPITAL_COMMUNITY): Payer: Self-pay | Admitting: General Surgery

## 2014-10-27 LAB — GC/CHLAMYDIA PROBE AMP (~~LOC~~) NOT AT ARMC
Chlamydia: NEGATIVE
Neisseria Gonorrhea: NEGATIVE

## 2014-11-19 NOTE — Discharge Summary (Signed)
Physician Discharge Summary  Patient ID: Pamela MessingShanelle C Cheese MRN: [id number] DOB/AGE: 23/02/1990 23 y.o.  Admit date: 10/25/2014 Discharge date: 11/19/2014  Admission Diagnoses:  Discharge Diagnoses:  Active Problems:   Acute appendicitis   Discharged Condition: good  Hospital Course: overnight observation, did well  Consults: None  Significant Diagnostic Studies: labs: cbc  Treatments: IV hydration, antibiotics and analgesia: acetaminophen w/ codeine  Discharge Exam: Blood pressure 95/61, pulse 52, temperature 98.4 F (36.9 C), temperature source Oral, resp. rate 18, height 5\' 3"  (1.6 m), weight 74.844 kg (165 lb), last menstrual period 10/18/2014, SpO2 100 %, not currently breastfeeding. General appearance: alert and cooperative  Abd: soft Incisions: clean, dry  Disposition: 01-Home or Self Care  Discharge Instructions    Diet - low sodium heart healthy    Complete by:  As directed      Increase activity slowly    Complete by:  As directed             Medication List    STOP taking these medications        ibuprofen 800 MG tablet  Commonly known as:  ADVIL,MOTRIN     MULTIVITAMIN GUMMIES ADULT PO     sertraline 50 MG tablet  Commonly known as:  ZOLOFT     traMADol 50 MG tablet  Commonly known as:  ULTRAM      TAKE these medications        docusate sodium 100 MG capsule  Commonly known as:  COLACE  Take 1 capsule (100 mg total) by mouth 2 (two) times daily.     naproxen sodium 220 MG tablet  Commonly known as:  ANAPROX  Take 660 mg by mouth daily as needed (for pain).     oxyCODONE-acetaminophen 5-325 MG tablet  Commonly known as:  PERCOCET/ROXICET  Take 1-2 tablets by mouth every 6 (six) hours as needed.           Follow-up Information    [facility] Schedule an appointment as soon as possible for a visit in 2 weeks.   Specialty:  General Surgery   Contact information:   [address] [place] [id number] [phone]      Signed: Vanita PandaHOMAS, Lurene Robley C. 11/19/2014, 8:47 AM

## 2015-02-02 ENCOUNTER — Ambulatory Visit: Payer: Medicaid Other | Admitting: Obstetrics

## 2015-02-03 ENCOUNTER — Ambulatory Visit: Payer: Medicaid Other | Admitting: Obstetrics

## 2015-02-04 ENCOUNTER — Ambulatory Visit (INDEPENDENT_AMBULATORY_CARE_PROVIDER_SITE_OTHER): Payer: Medicaid Other | Admitting: Obstetrics

## 2015-02-04 ENCOUNTER — Encounter: Payer: Self-pay | Admitting: Obstetrics

## 2015-02-04 VITALS — BP 110/69 | HR 68 | Temp 98.4°F | Wt 172.0 lb

## 2015-02-04 DIAGNOSIS — Z3202 Encounter for pregnancy test, result negative: Secondary | ICD-10-CM | POA: Diagnosis not present

## 2015-02-04 DIAGNOSIS — N760 Acute vaginitis: Secondary | ICD-10-CM

## 2015-02-04 DIAGNOSIS — R399 Unspecified symptoms and signs involving the genitourinary system: Secondary | ICD-10-CM | POA: Diagnosis not present

## 2015-02-04 DIAGNOSIS — IMO0002 Reserved for concepts with insufficient information to code with codable children: Secondary | ICD-10-CM

## 2015-02-04 LAB — POCT URINALYSIS DIPSTICK
Bilirubin, UA: NEGATIVE
Glucose, UA: NEGATIVE
Ketones, UA: NEGATIVE
LEUKOCYTES UA: NEGATIVE
NITRITE UA: NEGATIVE
PH UA: 7
PROTEIN UA: NEGATIVE
RBC UA: NEGATIVE
Spec Grav, UA: 1.015
Urobilinogen, UA: NEGATIVE

## 2015-02-04 LAB — POCT URINE PREGNANCY: Preg Test, Ur: NEGATIVE

## 2015-02-04 NOTE — Addendum Note (Signed)
Addended by: Henriette Combs on: 02/04/2015 03:34 PM   Modules accepted: Orders

## 2015-02-04 NOTE — Addendum Note (Signed)
Addended by: Marya Landry D on: 02/04/2015 03:03 PM   Modules accepted: Orders

## 2015-02-04 NOTE — Addendum Note (Signed)
Addended by: Marya Landry D on: 02/04/2015 03:12 PM   Modules accepted: Orders

## 2015-02-04 NOTE — Progress Notes (Signed)
Patient ID: Pamela Schaefer, female   DOB: 28-May-1990, 25 y.o.   MRN: [id number]  Chief Complaint  Patient presents with  . Urinary Tract Infection  . Vaginitis    HPI Pamela Schaefer is a 25 y.o. female.  Irritation with urination.  HPI  Past Medical History  Diagnosis Date  . No pertinent past medical history   . Pregnancy induced hypertension   . PONV (postoperative nausea and vomiting)     Past Surgical History  Procedure Laterality Date  . Clavicle surgery    . Laparoscopic appendectomy N/A 10/25/2014    Procedure: APPENDECTOMY LAPAROSCOPIC;  Surgeon: Gaynelle Adu, MD;  Location: [facility] OR;  Service: General;  Laterality: N/A;    Family History  Problem Relation Age of Onset  . Diabetes Maternal Grandmother   . Hypertension Maternal Grandmother   . Heart disease Maternal Grandfather   . Kidney disease Maternal Grandfather     failure  . Heart disease Father     Social History Social History  Substance Use Topics  . Smoking status: Current Some Day Smoker -- 0.10 packs/day    Types: Cigarettes  . Smokeless tobacco: Never Used     Comment: quit over year ago- 2013  . Alcohol Use: 0.0 oz/week    0 Standard drinks or equivalent per week     Comment: social    Allergies  Allergen Reactions  . Penicillins Itching    Current Outpatient Prescriptions  Medication Sig Dispense Refill  . ibuprofen (ADVIL,MOTRIN) 800 MG tablet Take 800 mg by mouth every 8 (eight) hours as needed.     No current facility-administered medications for this visit.    Review of Systems Review of Systems Constitutional: negative for fatigue and weight loss Respiratory: negative for cough and wheezing Cardiovascular: negative for chest pain, fatigue and palpitations Gastrointestinal: negative for abdominal pain and change in bowel habits Genitourinary: positive for irritation with urination Integument/breast: negative for nipple discharge Musculoskeletal:negative for  myalgias Neurological: negative for gait problems and tremors Behavioral/Psych: negative for abusive relationship, depression Endocrine: negative for temperature intolerance     Blood pressure 110/69, pulse 68, temperature 98.4 F (36.9 C), weight 172 lb (78.019 kg), last menstrual period 01/28/2015, not currently breastfeeding.  Physical Exam Physical Exam           General: Alert and no distress Abdomen:  normal findings: no organomegaly, soft, non-tender and no hernia  Pelvis:  External genitalia: normal general appearance Urinary system: urethral meatus normal and bladder without fullness, nontender Vaginal: normal without tenderness, induration or masses Cervix: normal appearance Adnexa: normal bimanual exam Uterus: anteverted and non-tender, normal size     Data Reviewed U/A  Assessment     UTI symptoms     Plan    Urine culture ordered Wet prep and cultures ordered F/U prn  No orders of the defined types were placed in this encounter.   Meds ordered this encounter  Medications  . ibuprofen (ADVIL,MOTRIN) 800 MG tablet    Sig: Take 800 mg by mouth every 8 (eight) hours as needed.

## 2015-02-05 LAB — URINE CULTURE
Colony Count: NO GROWTH
Organism ID, Bacteria: NO GROWTH

## 2015-02-07 LAB — SURESWAB, VAGINOSIS/VAGINITIS PLUS
Atopobium vaginae: 7.6 Log (cells/mL)
C. GLABRATA, DNA: NOT DETECTED
C. TRACHOMATIS RNA, TMA: NOT DETECTED
C. albicans, DNA: NOT DETECTED
C. parapsilosis, DNA: DETECTED — AB
C. tropicalis, DNA: NOT DETECTED
Gardnerella vaginalis: >8 Log (cells/mL)
LACTOBACILLUS SPECIES: NOT DETECTED Log (cells/mL)
N. gonorrhoeae RNA, TMA: NOT DETECTED
T. vaginalis RNA, QL TMA: NOT DETECTED

## 2015-02-08 ENCOUNTER — Other Ambulatory Visit: Payer: Self-pay | Admitting: Obstetrics

## 2015-02-08 DIAGNOSIS — N760 Acute vaginitis: Principal | ICD-10-CM

## 2015-02-08 DIAGNOSIS — B9689 Other specified bacterial agents as the cause of diseases classified elsewhere: Secondary | ICD-10-CM

## 2015-02-08 MED ORDER — METRONIDAZOLE 500 MG PO TABS: 500.0000 mg | ORAL_TABLET | Freq: Two times a day (BID) | ORAL | Status: DC

## 2015-02-24 ENCOUNTER — Other Ambulatory Visit: Payer: Self-pay | Admitting: *Deleted

## 2015-02-24 DIAGNOSIS — B379 Candidiasis, unspecified: Secondary | ICD-10-CM

## 2015-02-24 DIAGNOSIS — T3695XA Adverse effect of unspecified systemic antibiotic, initial encounter: Principal | ICD-10-CM

## 2015-02-24 MED ORDER — FLUCONAZOLE 150 MG PO TABS: 150.0000 mg | ORAL_TABLET | ORAL | Status: DC

## 2015-06-16 ENCOUNTER — Ambulatory Visit: Payer: Medicaid Other | Admitting: Obstetrics

## 2015-06-25 ENCOUNTER — Ambulatory Visit: Payer: Medicaid Other | Admitting: Certified Nurse Midwife

## 2015-07-08 IMAGING — US US OB COMP +14 WK
2 series · 12 of 28 positions shown · non-contrast
Comparison: none

[Series 1: us ob comp +14 wk · 3 of 21 slices shown (1 of 2)]
[im 4/21]
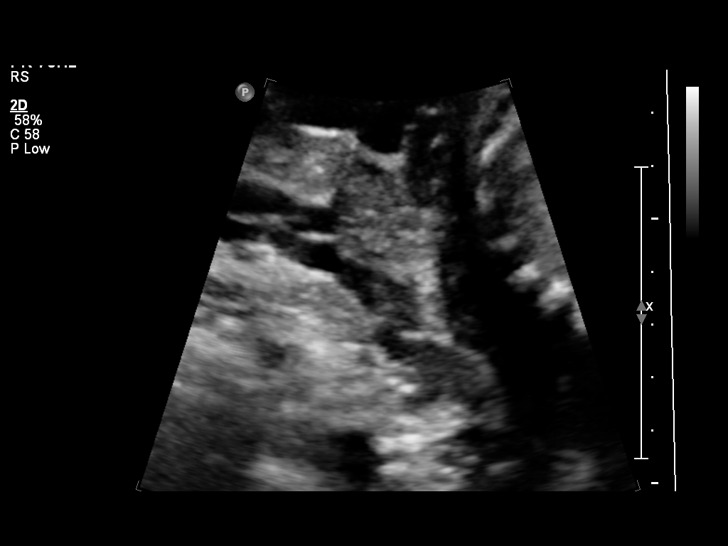
[im 11/21]
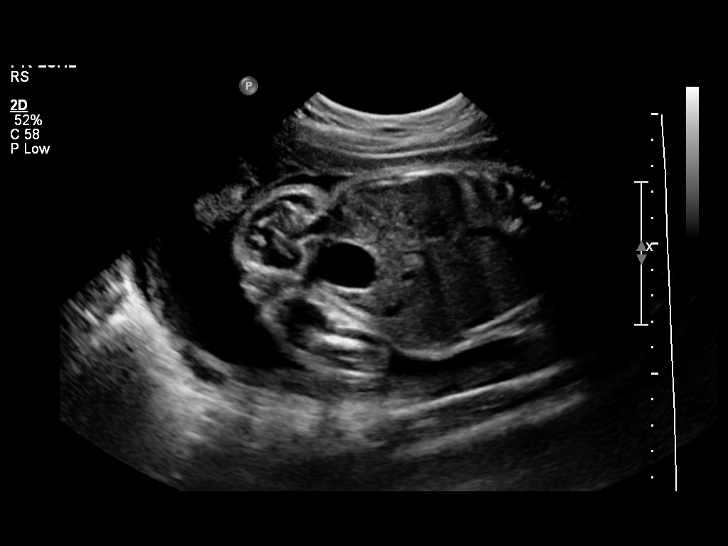
[im 17/21]
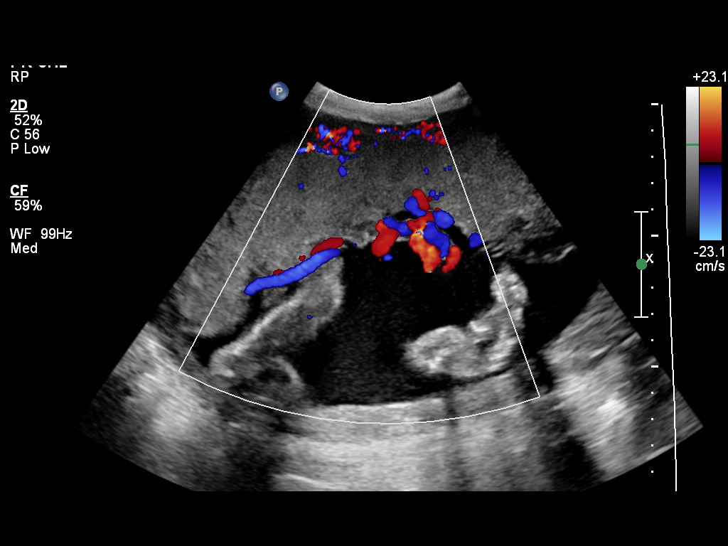

[Series 1: us ob comp +14 wk · 9 of 68 slices shown (2 of 2)]
[im 4/68]
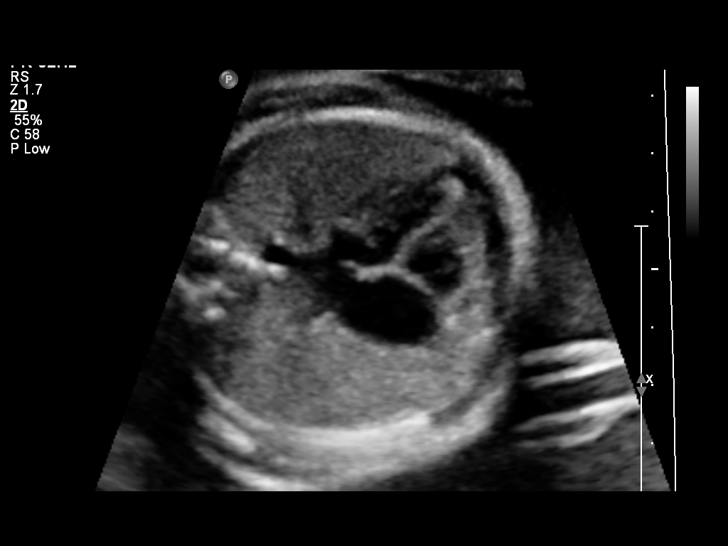
[im 11/68]
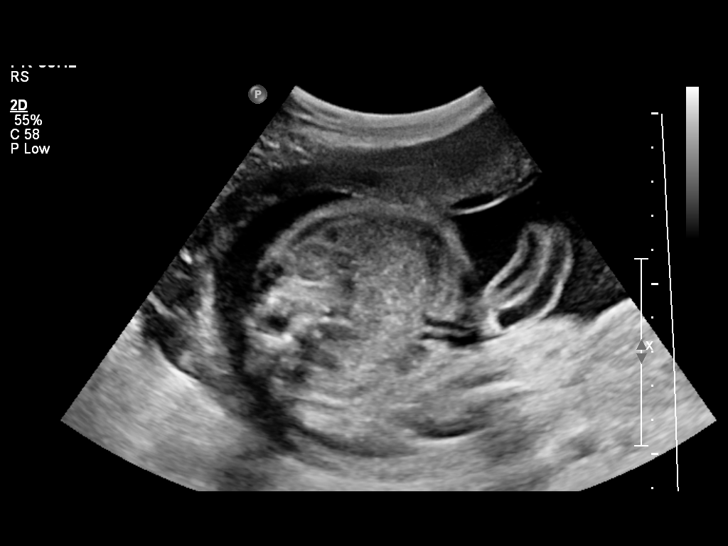
[im 17/68]
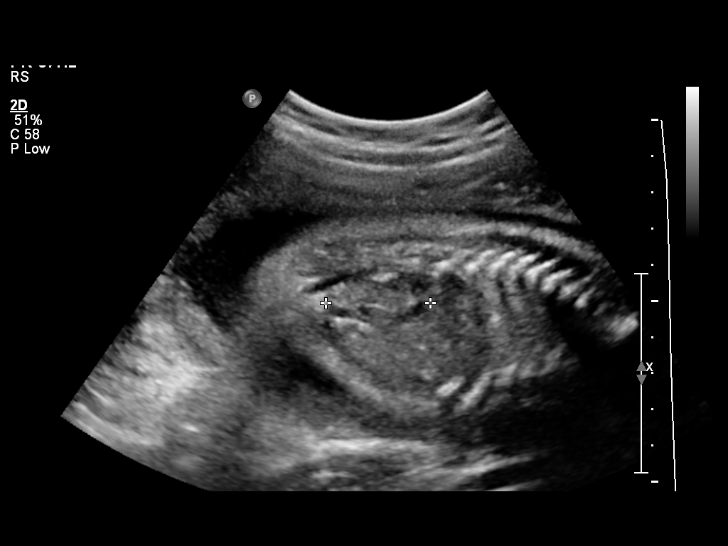
[im 27/68]
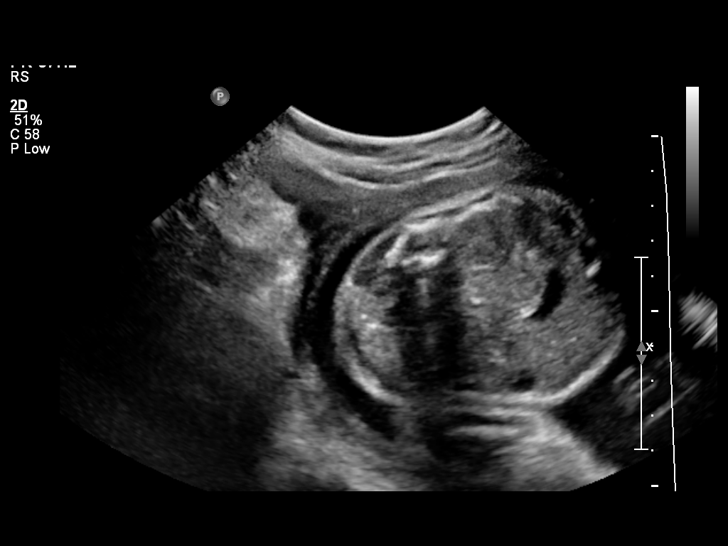
[im 34/68]
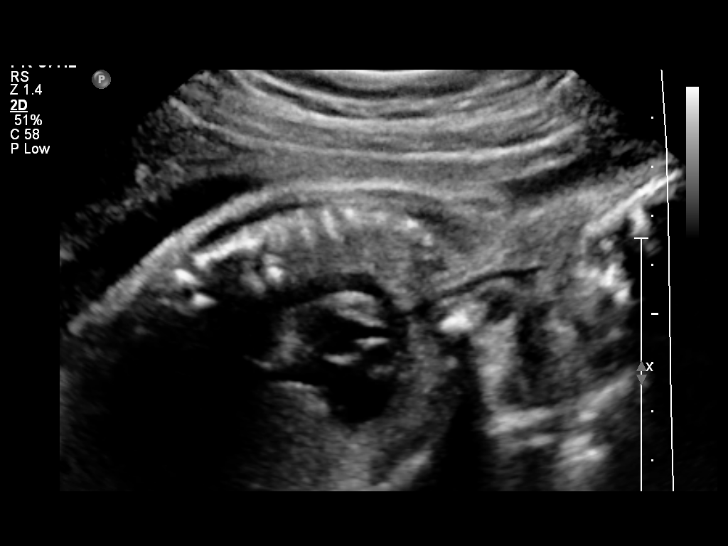
[im 41/68]
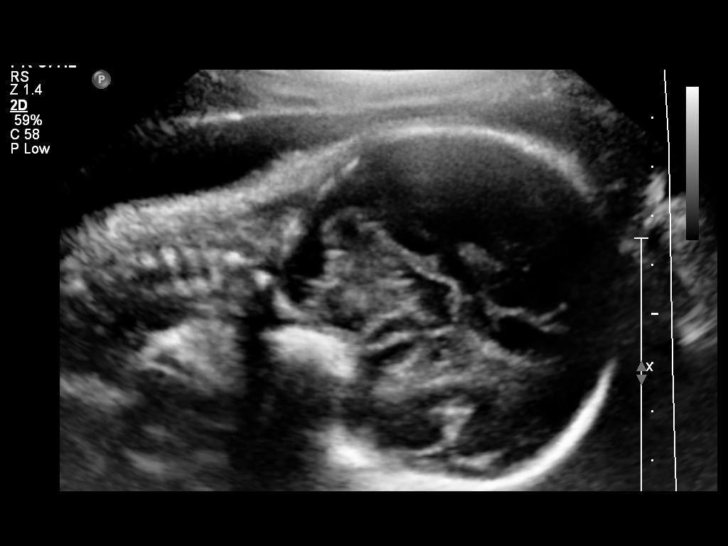
[im 51/68]
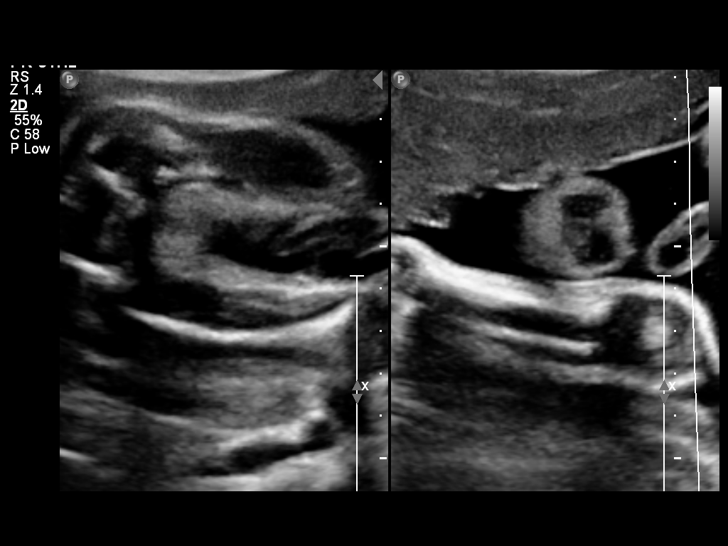
[im 57/68]
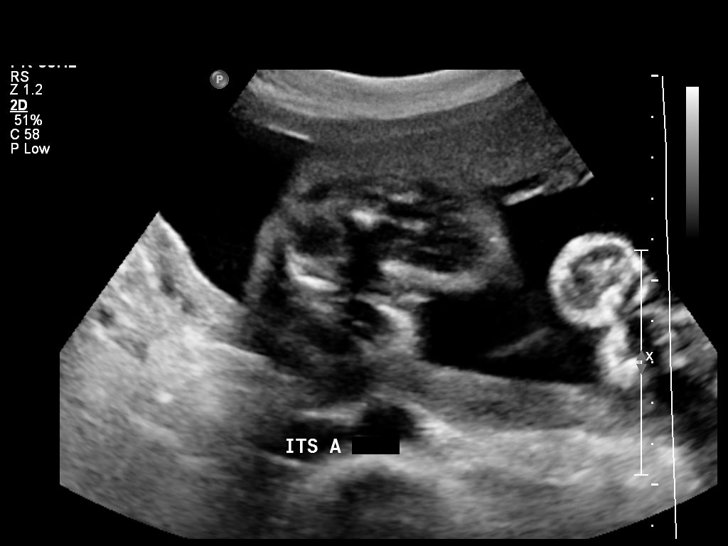
[im 64/68]
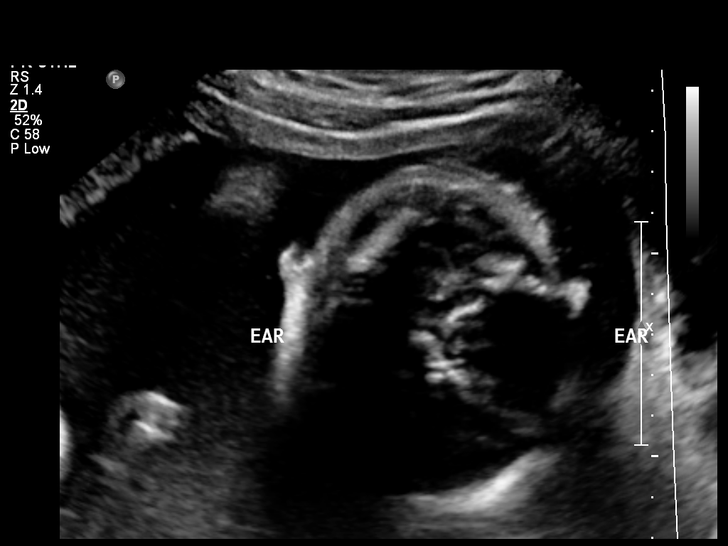

[12 of 28 positions shown; findings below may reference images not displayed]

OBSTETRICS REPORT
                      (Signed Final 08/07/2013 [DATE])

Service(s) Provided

 US OB COMP + 14 WK                                    76805.1
Indications

 Basic anatomic survey
Fetal Evaluation

 Num Of Fetuses:    1
 Fetal Heart Rate:  155                          bpm
 Cardiac Activity:  Observed
 Presentation:      Cephalic
 Placenta:          Anterior, above cervical os
 P. Cord            Visualized
 Insertion:

 Amniotic Fluid
 AFI FV:      Subjectively within normal limits
                                             Larg Pckt:     6.8  cm
Biometry

 BPD:     69.7  mm     G. Age:  28w 0d                CI:        74.36   70 - 86
                                                      FL/HC:      20.5   18.8 -

 HC:     256.6  mm     G. Age:  27w 6d       18  %    HC/AC:      1.09   1.05 -

 AC:       236  mm     G. Age:  27w 6d       40  %    FL/BPD:     75.6   71 - 87
 FL:      52.7  mm     G. Age:  28w 0d       36  %    FL/AC:      22.3   20 - 24
 CER:     30.5  mm     G. Age:  26w 5d       28  %

 Est. FW:    2231  gm      2 lb 9 oz     50  %
Gestational Age

 U/S Today:     28w 0d                                        EDD:   10/30/13
 Best:          28w 0d     Det. By:  U/S (08/07/13)           EDD:   10/30/13
Anatomy

 Cranium:          Appears normal         Aortic Arch:      Appears normal
 Fetal Cavum:      Appears normal         Ductal Arch:      Appears normal
 Ventricles:       Appears normal         Diaphragm:        Appears normal
 Choroid Plexus:   Appears normal         Stomach:          Appears normal, left
                                                            sided
 Cerebellum:       Appears normal         Abdomen:          Appears normal
 Posterior Fossa:  Appears normal         Abdominal Wall:   Appears nml (cord
                                                            insert, abd wall)
 Nuchal Fold:      Not applicable (>20    Cord Vessels:     Appears normal (3
                   wks GA)                                  vessel cord)
 Face:             Orbits nl; profile not Kidneys:          Appear normal
                   well visualized
 Lips:             Appears normal         Bladder:          Appears normal
 Heart:            Appears normal         Spine:            Appears normal
                   (4CH, axis, and
                   situs)
 RVOT:             Not well visualized    Lower             Appears normal
                                          Extremities:
 LVOT:             Appears normal         Upper             Appears normal
                                          Extremities:

 Other:  Fetus appears to be a female. Technically difficult due to fetal position.
Targeted Anatomy

 Fetal Central Nervous System
 Cisterna Magna:
Cervix Uterus Adnexa

 Cervical Length:    3.5      cm

 Cervix:       Normal appearance by transabdominal scan.
Impression

 Single IUP at 28w 0d
 Late onset of prenatal care
 Normal fetal anatomic survey.  Somewhat limited views of the
 fetal heart obtained (RVOT)
 Fetal growth appears appropriate (50th %tile)
 Anterior placenta without previa
 Normal amniotic fluid volume
Recommendations

 Recommend follow-up ultrasound examination in 4 weeks to
 complete anatomy

## 2015-08-03 ENCOUNTER — Telehealth: Payer: Self-pay

## 2015-08-05 ENCOUNTER — Encounter: Payer: Self-pay | Admitting: Certified Nurse Midwife

## 2015-08-05 ENCOUNTER — Ambulatory Visit (INDEPENDENT_AMBULATORY_CARE_PROVIDER_SITE_OTHER): Payer: Medicaid Other | Admitting: Certified Nurse Midwife

## 2015-08-05 VITALS — BP 114/75 | HR 89 | Temp 98.4°F | Wt 165.0 lb

## 2015-08-05 DIAGNOSIS — Z01812 Encounter for preprocedural laboratory examination: Secondary | ICD-10-CM

## 2015-08-05 DIAGNOSIS — K219 Gastro-esophageal reflux disease without esophagitis: Secondary | ICD-10-CM

## 2015-08-05 DIAGNOSIS — K5900 Constipation, unspecified: Secondary | ICD-10-CM

## 2015-08-05 DIAGNOSIS — Z01419 Encounter for gynecological examination (general) (routine) without abnormal findings: Secondary | ICD-10-CM | POA: Diagnosis not present

## 2015-08-05 DIAGNOSIS — Z30013 Encounter for initial prescription of injectable contraceptive: Secondary | ICD-10-CM

## 2015-08-05 DIAGNOSIS — Z0000 Encounter for general adult medical examination without abnormal findings: Secondary | ICD-10-CM

## 2015-08-05 DIAGNOSIS — Z1151 Encounter for screening for human papillomavirus (HPV): Secondary | ICD-10-CM

## 2015-08-05 DIAGNOSIS — Z113 Encounter for screening for infections with a predominantly sexual mode of transmission: Secondary | ICD-10-CM

## 2015-08-05 DIAGNOSIS — Z124 Encounter for screening for malignant neoplasm of cervix: Secondary | ICD-10-CM

## 2015-08-05 DIAGNOSIS — IMO0001 Reserved for inherently not codable concepts without codable children: Secondary | ICD-10-CM

## 2015-08-05 MED ORDER — POLYETHYLENE GLYCOL 3350 17 GM/SCOOP PO POWD: 17.0000 g | Freq: Every day | ORAL | 2 refills | Status: DC | PRN

## 2015-08-05 NOTE — Progress Notes (Signed)
Subjective:     Pamela Schaefer is a 25 y.o. female and is here for a gynecologic exam. The patient reports chronic LLQ pain. Requesting Pap and STD testing. Wants to start Depo Provera, but had unprotected IC ~1 week ago.   Last Pap 06/2014 normal. Patient's last menstrual period was 07/13/2015 (approximate). Regular cycles, moderate flow.   Social History   Social History  . Marital status: Single    Spouse name: N/A  . Number of children: 2  . Years of education: N/A   Occupational History  .  Unemployed   Social History Main Topics  . Smoking status: Current Some Day Smoker    Packs/day: 0.10    Types: Cigarettes  . Smokeless tobacco: Never Used     Comment: quit over year ago- 2013  . Alcohol use 0.0 oz/week     Comment: social  . Drug use: No  . Sexual activity: Not Currently    Partners: Male    Birth control/ protection: Condom   Other Topics Concern  . Not on file   Social History Narrative  . No narrative on file   Health Maintenance  Topic Date Due  . TETANUS/TDAP  12/05/2010  . INFLUENZA VACCINE  09/05/2015 (Originally 08/04/2015)  . CHLAMYDIA SCREENING  10/25/2015  . PAP SMEAR  05/11/2017  . HIV Screening  Completed    The following portions of the patient's history were reviewed and updated as appropriate: allergies, current medications, past family history, past medical history, past social history, past surgical history and problem list.  Review of Systems Constitutional: negative for chills, fatigue and fevers Gastrointestinal: positive for abdominal pain and constipation Genitourinary:negative for abnormal menstrual periods, sexual problems, vaginal discharge and intermenstrual bleeding, dysuria, frequency and hematuria Integument/breast: negative for breast lump and nipple discharge Pos for right breast tenderness x a few days.   Objective:      BP 114/75   Pulse 89   Temp 98.4 F (36.9 C)   Wt 165 lb (74.8 kg)   LMP 07/13/2015  (Approximate)   Breastfeeding? No   BMI 29.23 kg/m     Pelvic Exam:    Perineum: No Hemorrhoids, Normal Perineum   Vulva: normal   Vagina:  normal mucosa, normal discharge, no palpable nodules   pH: Not done   Cervix: no bleeding following Pap, no cervical motion tenderness and no lesions   Adnexa: normal adnexa and no mass, fullness, tenderness   Bony Pelvis: Average  System: Breast:  No nipple retraction or dimpling, No nipple discharge or bleeding, No axillary or supraclavicular adenopathy, Normal to palpation without dominant masses   Skin: normal coloration and turgor, no rashes    Neurologic: negative   Extremities: normal strength, tone, and muscle mass   HEENT neck supple with midline trachea and thyroid without masses   Mouth/Teeth mucous membranes moist, pharynx normal without lesions   Neck supple and no masses   Cardiovascular: regular rate and rhythm, no murmurs or gallops   Respiratory:  appears well, vitals normal, no respiratory distress, acyanotic, normal RR, neck free of mass or lymphadenopathy, chest clear, no wheezing, crepitations, rhonchi, normal symmetric air entry   Abdomen: soft, non-tender; bowel sounds normal; no masses,  no organomegaly   Urinary: urethral meatus normal     Assessment:   1. Constipation, unspecified constipation type  - polyethylene glycol powder (GLYCOLAX/MIRALAX) powder; Take 17 g by mouth daily as needed for moderate constipation.  Dispense: 500 g; Refill: 2  2.  Cervical cancer screening  - Pap IG w/ reflex to HPV when ASC-U  3. Screening examination for venereal disease  - HIV antibody (with reflex) - RPR - Hepatitis B Surface AntiGEN - GC/Chlamydia Probe Amp  4. Routine cervical smear   5. Special screening examination for human papillomavirus (HPV)   6. Pre-procedural laboratory examination  - POCT urine pregnancy  7. Encounter for initial prescription of injectable contraceptive  - medroxyPROGESTERone  (DEPO-PROVERA) 150 MG/ML injection; Inject 1 mL (150 mg total) into the muscle every 3 (three) months.  Dispense: 1 mL; Refill: 1   Plan:  Abstain x 1 week. Return for UPT and Depo. SBE taught. F/U for further eval if tenderness does not resolve after menses. Safe sex practices discussed.   Dorathy Kinsman, CNM   See After Visit Summary for Counseling Recommendations

## 2015-08-06 LAB — HIV ANTIBODY (ROUTINE TESTING W REFLEX): HIV SCREEN 4TH GENERATION: NONREACTIVE

## 2015-08-06 LAB — RPR: RPR: NONREACTIVE

## 2015-08-06 LAB — HEPATITIS B SURFACE ANTIGEN: HEP B S AG: NEGATIVE

## 2015-08-06 MED ORDER — MEDROXYPROGESTERONE ACETATE 150 MG/ML IM SUSP: 150.0000 mg | INTRAMUSCULAR | 1 refills | Status: DC

## 2015-08-06 NOTE — Patient Instructions (Signed)

## 2015-08-07 LAB — PAP IG W/ RFLX HPV ASCU: PAP SMEAR COMMENT: 0

## 2015-08-07 LAB — GC/CHLAMYDIA PROBE AMP
Chlamydia trachomatis, NAA: NEGATIVE
NEISSERIA GONORRHOEAE BY PCR: NEGATIVE

## 2015-08-10 NOTE — Telephone Encounter (Signed)
Left mess for pt. To call and reschedule. appt 

## 2015-09-24 ENCOUNTER — Telehealth: Payer: Self-pay | Admitting: *Deleted

## 2015-09-24 ENCOUNTER — Ambulatory Visit: Payer: Self-pay

## 2015-09-24 NOTE — Telephone Encounter (Signed)
Checked on patient's Depo at her pharmacy she has an appointment for her injection today.Pharmacy stated she has a refill already.Called patient and left message.

## 2015-12-30 ENCOUNTER — Ambulatory Visit: Payer: Medicaid Other | Admitting: Certified Nurse Midwife

## 2016-01-15 ENCOUNTER — Ambulatory Visit (INDEPENDENT_AMBULATORY_CARE_PROVIDER_SITE_OTHER): Payer: Medicaid Other | Admitting: Obstetrics

## 2016-01-15 ENCOUNTER — Encounter: Payer: Self-pay | Admitting: Gastroenterology

## 2016-01-15 ENCOUNTER — Encounter: Payer: Self-pay | Admitting: Obstetrics

## 2016-01-15 ENCOUNTER — Other Ambulatory Visit (HOSPITAL_COMMUNITY)
Admission: RE | Admit: 2016-01-15 | Discharge: 2016-01-15 | Disposition: A | Discharge status: Home / Self Care | Payer: Medicaid Other | Source: Ambulatory Visit | Attending: Certified Nurse Midwife | Admitting: Certified Nurse Midwife

## 2016-01-15 VITALS — BP 118/74 | HR 77 | Wt 169.2 lb

## 2016-01-15 DIAGNOSIS — N939 Abnormal uterine and vaginal bleeding, unspecified: Secondary | ICD-10-CM | POA: Diagnosis present

## 2016-01-15 DIAGNOSIS — Z30011 Encounter for initial prescription of contraceptive pills: Secondary | ICD-10-CM

## 2016-01-15 DIAGNOSIS — Z3009 Encounter for other general counseling and advice on contraception: Secondary | ICD-10-CM | POA: Diagnosis not present

## 2016-01-15 DIAGNOSIS — Z01419 Encounter for gynecological examination (general) (routine) without abnormal findings: Secondary | ICD-10-CM

## 2016-01-15 DIAGNOSIS — N946 Dysmenorrhea, unspecified: Secondary | ICD-10-CM | POA: Diagnosis not present

## 2016-01-15 DIAGNOSIS — K5900 Constipation, unspecified: Secondary | ICD-10-CM

## 2016-01-15 DIAGNOSIS — Z113 Encounter for screening for infections with a predominantly sexual mode of transmission: Secondary | ICD-10-CM

## 2016-01-15 MED ORDER — LO LOESTRIN FE 1 MG-10 MCG / 10 MCG PO TABS: 1.0000 | ORAL_TABLET | Freq: Every day | ORAL | 4 refills | Status: DC

## 2016-01-15 MED ORDER — DOCUSATE SODIUM 100 MG PO CAPS: 100.0000 mg | ORAL_CAPSULE | Freq: Two times a day (BID) | ORAL | 11 refills | Status: DC

## 2016-01-15 MED ORDER — NORETHIN ACE-ETH ESTRAD-FE 1-20 MG-MCG(24) PO TABS
1.0000 | ORAL_TABLET | Freq: Every day | ORAL | 11 refills | Status: DC
Start: 2016-01-15 — End: 2016-01-15

## 2016-01-15 MED ORDER — IBUPROFEN 800 MG PO TABS: 800.0000 mg | ORAL_TABLET | Freq: Three times a day (TID) | ORAL | 5 refills | Status: DC | PRN

## 2016-01-15 MED ORDER — MAGNESIUM HYDROXIDE 400 MG/5ML PO SUSP
30.0000 mL | Freq: Every day | ORAL | 99 refills | Status: DC | PRN
Start: 2016-01-15 — End: 2016-01-27

## 2016-01-15 NOTE — Patient Instructions (Signed)
Constipation, Adult Constipation is when a person has fewer bowel movements in a week than normal, has difficulty having a bowel movement, or has stools that are dry, hard, or larger than normal. Constipation may be caused by an underlying condition. It may become worse with age if a person takes certain medicines and does not take in enough fluids. Follow these instructions at home: Eating and drinking  Eat foods that have a lot of fiber, such as fresh fruits and vegetables, whole grains, and beans.  Limit foods that are high in fat, low in fiber, or overly processed, such as french fries, hamburgers, cookies, candies, and soda.  Drink enough fluid to keep your urine clear or pale yellow. General instructions  Exercise regularly or as told by your health care provider.  Go to the restroom when you have the urge to go. Do not hold it in.  Take over-the-counter and prescription medicines only as told by your health care provider. These include any fiber supplements.  Practice pelvic floor retraining exercises, such as deep breathing while relaxing the lower abdomen and pelvic floor relaxation during bowel movements.  Watch your condition for any changes.  Keep all follow-up visits as told by your health care provider. This is important. Contact a health care provider if:  You have pain that gets worse.  You have a fever.  You do not have a bowel movement after 4 days.  You vomit.  You are not hungry.  You lose weight.  You are bleeding from the anus.  You have thin, pencil-like stools. Get help right away if:  You have a fever and your symptoms suddenly get worse.  You leak stool or have blood in your stool.  Your abdomen is bloated.  You have severe pain in your abdomen.  You feel dizzy or you faint. This information is not intended to replace advice given to you by your health care provider. Make sure you discuss any questions you have with your health care  provider. Document Released: 09/18/2003 Document Revised: 07/10/2015 Document Reviewed: 06/10/2015 Elsevier Interactive Patient Education  2017 Elsevier Inc. Irritable Bowel Syndrome, Adult Irritable bowel syndrome (IBS) is not one specific disease. It is a group of symptoms that affects the organs responsible for digestion (gastrointestinal or GI tract). To regulate how your GI tract works, your body sends signals back and forth between your intestines and your brain. If you have IBS, there may be a problem with these signals. As a result, your GI tract does not function normally. Your intestines may become more sensitive and overreact to certain things. This is especially true when you eat certain foods or when you are under stress. There are four types of IBS. These may be determined based on the consistency of your stool:  IBS with diarrhea.  IBS with constipation.  Mixed IBS.  Unsubtyped IBS. It is important to know which type of IBS you have. Some treatments are more likely to be helpful for certain types of IBS. What are the causes? The exact cause of IBS is not known. What increases the risk? You may have a higher risk of IBS if:  You are a woman.  You are younger than 25 years old.  You have a family history of IBS.  You have mental health problems.  You have had bacterial infection of your GI tract. What are the signs or symptoms? Symptoms of IBS vary from person to person. The main symptom is abdominal pain or discomfort. Additional  symptoms usually include one or more of the following:  Diarrhea, constipation, or both.  Abdominal swelling or bloating.  Feeling full or sick after eating a small or regular-size meal.  Frequent gas.  Mucus in the stool.  A feeling of having more stool left after a bowel movement. Symptoms tend to come and go. They may be associated with stress, psychiatric conditions, or nothing at all. How is this diagnosed? There is no  specific test to diagnose IBS. Your health care provider will make a diagnosis based on a physical exam, medical history, and your symptoms. You may have other tests to rule out other conditions that may be causing your symptoms. These may include:  Blood tests.  X-rays.  CT scan.  Endoscopy and colonoscopy. This is a test in which your GI tract is viewed with a long, thin, flexible tube. How is this treated? There is no cure for IBS, but treatment can help relieve symptoms. IBS treatment often includes:  Changes to your diet, such as:  Eating more fiber.  Avoiding foods that cause symptoms.  Drinking more water.  Eating regular, medium-sized portioned meals.  Medicines. These may include:  Fiber supplements if you have constipation.  Medicine to control diarrhea (antidiarrheal medicines).  Medicine to help control muscle spasms in your GI tract (antispasmodic medicines).  Medicines to help with any mental health issues, such as antidepressants or tranquilizers.  Therapy.  Talk therapy may help with anxiety, depression, or other mental health issues that can make IBS symptoms worse.  Stress reduction.  Managing your stress can help keep symptoms under control. Follow these instructions at home:  Take medicines only as directed by your health care provider.  Eat a healthy diet.  Avoid foods and drinks with added sugar.  Include more whole grains, fruits, and vegetables gradually into your diet. This may be especially helpful if you have IBS with constipation.  Avoid any foods and drinks that make your symptoms worse. These may include dairy products and caffeinated or carbonated drinks.  Do not eat large meals.  Drink enough fluid to keep your urine clear or pale yellow.  Exercise regularly. Ask your health care provider for recommendations of good activities for you.  Keep all follow-up visits as directed by your health care provider. This is  important. Contact a health care provider if:  You have constant pain.  You have trouble or pain with swallowing.  You have worsening diarrhea. Get help right away if:  You have severe and worsening abdominal pain.  You have diarrhea and:  You have a rash, stiff neck, or severe headache.  You are irritable, sleepy, or difficult to awaken.  You are weak, dizzy, or extremely thirsty.  You have bright red blood in your stool or you have black tarry stools.  You have unusual abdominal swelling that is painful.  You vomit continuously.  You vomit blood (hematemesis).  You have both abdominal pain and a fever. This information is not intended to replace advice given to you by your health care provider. Make sure you discuss any questions you have with your health care provider. Document Released: 12/20/2004 Document Revised: 05/22/2015 Document Reviewed: 09/06/2013 Elsevier Interactive Patient Education  2017 Elsevier Inc. Diet for Irritable Bowel Syndrome Introduction When you have irritable bowel syndrome (IBS), the foods you eat and your eating habits are very important. IBS may cause various symptoms, such as abdominal pain, constipation, or diarrhea. Choosing the right foods can help ease discomfort caused by  these symptoms. Work with your health care provider and dietitian to find the best eating plan to help control your symptoms. What general guidelines do I need to follow?  Keep a food diary. This will help you identify foods that cause symptoms. Write down:  What you eat and when.  What symptoms you have.  When symptoms occur in relation to your meals.  Avoid foods that cause symptoms. Talk with your dietitian about other ways to get the same nutrients that are in these foods.  Eat more foods that contain fiber. Take a fiber supplement if directed by your dietitian.  Eat your meals slowly, in a relaxed setting.  Aim to eat 5-6 small meals per day. Do not skip  meals.  Drink enough fluids to keep your urine clear or pale yellow.  Ask your health care provider if you should take an over-the-counter probiotic during flare-ups to help restore healthy gut bacteria.  If you have cramping or diarrhea, try making your meals low in fat and high in carbohydrates. Examples of carbohydrates are pasta, rice, whole grain breads and cereals, fruits, and vegetables.  If dairy products cause your symptoms to flare up, try eating less of them. You might be able to handle yogurt better than other dairy products because it contains bacteria that help with digestion. What foods are not recommended? The following are some foods and drinks that may worsen your symptoms:  Fatty foods, such as [place] fries.  Milk products, such as cheese or ice cream.  Chocolate.  Alcohol.  Products with caffeine, such as coffee.  Carbonated drinks, such as soda. The items listed above may not be a complete list of foods and beverages to avoid. Contact your dietitian for more information.  What foods are good sources of fiber? Your health care provider or dietitian may recommend that you eat more foods that contain fiber. Fiber can help reduce constipation and other IBS symptoms. Add foods with fiber to your diet a little at a time so that your body can get used to them. Too much fiber at once might cause gas and swelling of your abdomen. The following are some foods that are good sources of fiber:  Apples.  Peaches.  Pears.  Berries.  Figs.  Broccoli (raw).  Cabbage.  Carrots.  Raw peas.  Kidney beans.  Lima beans.  Whole grain bread.  Whole grain cereal. Where to find more information: [employer] for Functional Gastrointestinal Disorders: www.iffgd.[employer] National Institute of Diabetes and Digestive and Kidney Diseases: http://norris-lawson.com/www.niddk.nih.gov/health-information/health-topics/digestive-diseases/ibs/Pages/facts.aspx This information is not intended to  replace advice given to you by your health care provider. Make sure you discuss any questions you have with your health care provider. Document Released: 03/12/2003 Document Revised: 05/28/2015 Document Reviewed: 03/22/2013  2017 Elsevier

## 2016-01-15 NOTE — Progress Notes (Signed)
Patient has TAB 10/14 and she did not have follow up. Patient has had irregular bleeding sometimes heavy a times. [facility]

## 2016-01-15 NOTE — Progress Notes (Signed)
Subjective:        Pamela Schaefer is a 26 y.o. female here for a routine exam.   Had recent TAB, 2 months ago.  Presents today for follow up.  Periods have just become normal post op.  Requests STD screen.  Current complaints: Chronic constipation and lower abdominal pain.    Personal health questionnaire:  Is patient Ashkenazi Jewish, have a family history of breast and/or ovarian cancer: no Is there a family history of uterine cancer diagnosed at age < 25, gastrointestinal cancer, urinary tract cancer, family member who is a Personnel officer syndrome-associated carrier: no Is the patient overweight and hypertensive, family history of diabetes, personal history of gestational diabetes, preeclampsia or PCOS: no Is patient over 26, have PCOS,  family history of premature CHD under age 11, diabetes, smoke, have hypertension or peripheral artery disease:  no At any time, has a partner hit, kicked or otherwise hurt or frightened you?: no Over the past 2 weeks, have you felt down, depressed or hopeless?: no Over the past 2 weeks, have you felt little interest or pleasure in doing things?:no   Gynecologic History Patient's last menstrual period was 01/05/2016. Contraception: condoms Last Pap: 2017. Results were: normal Last mammogram: n/a. Results were: n/a  Obstetric History OB History  Gravida Para Term Preterm AB Living  6 4 4  0 2 4  SAB TAB Ectopic Multiple Live Births  1 1 0 0 4    # Outcome Date GA Lbr Len/2nd Weight Sex Delivery Anes PTL Lv  6 Term 10/17/13 [redacted]w[redacted]d 08:24 / 00:33 6 lb 4 oz (2.835 kg) F Vag-Spont EPI  LIV  5 Term 12/08/12 [redacted]w[redacted]d 21:00 / 00:29 7 lb 12.3 oz (3.525 kg) F Vag-Spont EPI  LIV     Birth Comments: none  4 Term 04/25/11 [redacted]w[redacted]d 08:43 / 00:17 8 lb 8 oz (3.856 kg) F Vag-Spont EPI  LIV     Birth Comments: NA  3 Term 09/21/07 [redacted]w[redacted]d 08:00 6 lb 6 oz (2.892 kg) M Vag-Spont EPI  LIV  2 TAB           1 SAB              Birth Comments: System Generated. Please review and  update pregnancy details.      Past Medical History:  Diagnosis Date  . No pertinent past medical history   . PONV (postoperative nausea and vomiting)   . Pregnancy induced hypertension     Past Surgical History:  Procedure Laterality Date  . CLAVICLE SURGERY    . INDUCED ABORTION    . LAPAROSCOPIC APPENDECTOMY N/A 10/25/2014   Procedure: APPENDECTOMY LAPAROSCOPIC;  Surgeon: Gaynelle Adu, MD;  Location: [facility] OR;  Service: General;  Laterality: N/A;     Current Outpatient Prescriptions:  .  ibuprofen (ADVIL,MOTRIN) 800 MG tablet, Take 1 tablet (800 mg total) by mouth every 8 (eight) hours as needed for headache or cramping., Disp: 30 tablet, Rfl: 5 .  docusate sodium (COLACE) 100 MG capsule, Take 1 capsule (100 mg total) by mouth 2 (two) times daily., Disp: 60 capsule, Rfl: 11 .  magnesium hydroxide (MILK OF MAGNESIA) 400 MG/5ML suspension, Take 30 mLs by mouth daily as needed for mild constipation., Disp: 360 mL, Rfl: prn .  Norethindrone Acetate-Ethinyl Estrad-FE (LOESTRIN 24 FE) 1-20 MG-MCG(24) tablet, Take 1 tablet by mouth daily. Start taking on 1st day of period., Disp: 1 Package, Rfl: 11 Allergies  Allergen Reactions  . Penicillins Itching  Social History  Substance Use Topics  . Smoking status: Current Some Day Smoker    Packs/day: 0.10    Types: Cigarettes  . Smokeless tobacco: Never Used     Comment: quit over year ago- 2013  . Alcohol use 0.0 oz/week     Comment: social    Family History  Problem Relation Age of Onset  . Heart disease Maternal Grandfather   . Kidney disease Maternal Grandfather     failure  . Heart disease Father   . Diabetes Maternal Grandmother   . Hypertension Maternal Grandmother       Review of Systems  Constitutional: negative for fatigue and weight loss Respiratory: negative for cough and wheezing Cardiovascular: negative for chest pain, fatigue and palpitations Gastrointestinal: negative for abdominal pain and change in bowel  habits Musculoskeletal:negative for myalgias Neurological: negative for gait problems and tremors Behavioral/Psych: negative for abusive relationship, depression Endocrine: negative for temperature intolerance    Genitourinary:negative for abnormal menstrual periods, genital lesions, hot flashes, sexual problems and vaginal discharge Integument/breast: negative for breast lump, breast tenderness, nipple discharge and skin lesion(s)    Objective:       BP 118/74   Pulse 77   Wt 169 lb 3.2 oz (76.7 kg)   LMP 01/05/2016 Comment: regular cycle  BMI 29.97 kg/m  General:   alert  Skin:   no rash or abnormalities  Lungs:   clear to auscultation bilaterally  Heart:   regular rate and rhythm, S1, S2 normal, no murmur, click, rub or gallop  Breasts:   normal without suspicious masses, skin or nipple changes or axillary nodes  Abdomen:  normal findings: no organomegaly, soft, non-tender and no hernia  Pelvis:  External genitalia: normal general appearance Urinary system: urethral meatus normal and bladder without fullness, nontender Vaginal: normal without tenderness, induration or masses Cervix: normal appearance Adnexa: normal bimanual exam Uterus: anteverted and non-tender, normal size   Lab Review Urine pregnancy test Labs reviewed yes Radiologic studies reviewed no  50% of 20 min visit spent on counseling and coordination of care.    Assessment:    Healthy female exam.    S/P Therapeutic Abortion.  Doing well. Chronic constipation Contraceptive Counseling and Advice.  Wants to start OCP's.   Plan:    Referred to Gastroenterology for Chronic Constipation.  R/O IBS.  Education reviewed: low fat, low cholesterol diet, safe sex/STD prevention, self breast exams and weight bearing exercise. Contraception: OCP (estrogen/progesterone). Follow up in: 9 months.   Meds ordered this encounter  Medications  . ibuprofen (ADVIL,MOTRIN) 800 MG tablet    Sig: Take 1 tablet (800 mg  total) by mouth every 8 (eight) hours as needed for headache or cramping.    Dispense:  30 tablet    Refill:  5  . Norethindrone Acetate-Ethinyl Estrad-FE (LOESTRIN 24 FE) 1-20 MG-MCG(24) tablet    Sig: Take 1 tablet by mouth daily. Start taking on 1st day of period.    Dispense:  1 Package    Refill:  11  . magnesium hydroxide (MILK OF MAGNESIA) 400 MG/5ML suspension    Sig: Take 30 mLs by mouth daily as needed for mild constipation.    Dispense:  360 mL    Refill:  prn  . docusate sodium (COLACE) 100 MG capsule    Sig: Take 1 capsule (100 mg total) by mouth 2 (two) times daily.    Dispense:  60 capsule    Refill:  11   Orders Placed This Encounter  Procedures  . Ambulatory referral to Gastroenterology    Referral Priority:   Routine    Referral Type:   Consultation    Referral Reason:   Specialty Services Required    Number of Visits Requested:   1     Patient ID: Pamela Schaefer, female   DOB: 10/19/1990, 26 y.o.   MRN: [id number]

## 2016-01-16 LAB — HEPATITIS C ANTIBODY

## 2016-01-16 LAB — HIV ANTIBODY (ROUTINE TESTING W REFLEX): HIV SCREEN 4TH GENERATION: NONREACTIVE

## 2016-01-16 LAB — HEPATITIS B SURFACE ANTIGEN: Hepatitis B Surface Ag: NEGATIVE

## 2016-01-16 LAB — RPR: RPR Ser Ql: NONREACTIVE

## 2016-01-18 LAB — CERVICOVAGINAL ANCILLARY ONLY
BACTERIAL VAGINITIS: POSITIVE — AB
Candida vaginitis: NEGATIVE
Chlamydia: NEGATIVE
NEISSERIA GONORRHEA: NEGATIVE
TRICH (WINDOWPATH): NEGATIVE

## 2016-01-19 ENCOUNTER — Other Ambulatory Visit: Payer: Self-pay | Admitting: Obstetrics

## 2016-01-19 DIAGNOSIS — B9689 Other specified bacterial agents as the cause of diseases classified elsewhere: Secondary | ICD-10-CM

## 2016-01-19 DIAGNOSIS — N760 Acute vaginitis: Principal | ICD-10-CM

## 2016-01-19 MED ORDER — TINIDAZOLE 500 MG PO TABS: 1000.0000 mg | ORAL_TABLET | Freq: Every day | ORAL | 2 refills | Status: DC

## 2016-01-27 ENCOUNTER — Ambulatory Visit (INDEPENDENT_AMBULATORY_CARE_PROVIDER_SITE_OTHER): Payer: Medicaid Other | Admitting: Gastroenterology

## 2016-01-27 ENCOUNTER — Other Ambulatory Visit (INDEPENDENT_AMBULATORY_CARE_PROVIDER_SITE_OTHER): Payer: Medicaid Other

## 2016-01-27 ENCOUNTER — Encounter: Payer: Self-pay | Admitting: Gastroenterology

## 2016-01-27 VITALS — BP 86/64 | HR 60 | Ht 63.0 in | Wt 173.5 lb

## 2016-01-27 DIAGNOSIS — K5901 Slow transit constipation: Secondary | ICD-10-CM | POA: Diagnosis not present

## 2016-01-27 DIAGNOSIS — R1032 Left lower quadrant pain: Secondary | ICD-10-CM

## 2016-01-27 LAB — CALCIUM: Calcium: 9.1 mg/dL (ref 8.4–10.5)

## 2016-01-27 LAB — TSH: TSH: 0.92 u[IU]/mL (ref 0.35–4.50)

## 2016-01-27 NOTE — Patient Instructions (Addendum)
Constipation:   Increase your intake of water, fruits/vegetables and your activity level.  Ducosate stool softener, 100 mg twice daily. If not improved in 1 week after starting that, please add:  Miralax powder  1 capful in a glass of water or juice once daily.   Please call us in a few weeks and let us know how you are doing.  Have labs drawn today to check your thyroid gland function and your calcium level.  If you are age 25 or older, your body mass index should be between 23-30. Your Body mass index is 30.73 kg/m. If this is out of the aforementioned range listed, please consider follow up with your Primary Care Provider.  If you are age 25 or younger, your body mass index should be between 19-25. Your Body mass index is 30.73 kg/m. If this is out of the aformentioned range listed, please consider follow up with your Primary Care Provider.   Thank you for choosing [facility]  Dr Amada JupiterHenry Danis III

## 2016-01-27 NOTE — Progress Notes (Signed)
[facility] Consult Note:  History: Pamela MessingShanelle C Schaefer 01/27/2016  Referring physician: No PCP Per Patient  Reason for consult/chief complaint: Constipation (with abdominal pain and gas X 1 year)   Subjective  HPI:  This is a 26 year old woman referred by Dr. Clearance [facility]Har[facility] gynecology for chronic constipation. 49-12 months she has had increasing difficulty with stools that are incomplete and firm or pellet like. She is bothered by an intermittent sharp left lower quadrant pain around the time of BMs, as well as bloating and gas. She wonders if it may have gotten worse after her appendectomy last spring. She denies rectal bleeding, feels like she has a pretty physical job but does not do any additional exercise. She has had 4 children by vaginal delivery, and recalls a tear requiring 3 stitches after the birth of her second child about 5 years ago. Her only treatment thus far has been a few doses of stool softener.  ROS:  Review of Systems  Constitutional: Negative for appetite change and unexpected weight change.  HENT: Negative for mouth sores and voice change.   Eyes: Negative for pain and redness.  Respiratory: Negative for cough and shortness of breath.   Cardiovascular: Negative for chest pain and palpitations.  Genitourinary: Negative for dysuria and hematuria.  Musculoskeletal: Negative for arthralgias and myalgias.  Skin: Negative for pallor and rash.  Neurological: Negative for weakness and headaches.  Hematological: Negative for adenopathy.     Past Medical History: Past Medical History:  Diagnosis Date  . No pertinent past medical history   . PONV (postoperative nausea and vomiting)   . Pregnancy induced hypertension      Past Surgical History: Past Surgical History:  Procedure Laterality Date  . CLAVICLE SURGERY    . INDUCED ABORTION    . LAPAROSCOPIC APPENDECTOMY N/A 10/25/2014   Procedure: APPENDECTOMY LAPAROSCOPIC;  Surgeon: Gaynelle AduEric Wilson, MD;   [facility];  Service: General;  Laterality: N/A;     Family History: Family History  Problem Relation Age of Onset  . Heart disease Maternal Grandfather   . Kidney disease Maternal Grandfather     failure  . Heart disease Father   . Diabetes Maternal Grandmother   . Hypertension Maternal Grandmother   . Colon cancer Neg Hx   . Stomach cancer Neg Hx   . Rectal cancer Neg Hx   . Esophageal cancer Neg Hx   . Liver cancer Neg Hx     Social History: Social History   Social History  . Marital status: Single    Spouse name: N/A  . Number of children: 4  . Years of education: N/A   Occupational History  . [employer]    Social History Main Topics  . Smoking status: Current Some Day Smoker    Packs/day: 0.10    Types: Cigarettes  . Smokeless tobacco: Never Used  . Alcohol use 0.0 oz/week     Comment: social  . Drug use: No  . Sexual activity: Not Currently    Partners: Male    Birth control/ protection: Pill   Other Topics Concern  . None   Social History Narrative  . None    Allergies: Allergies  Allergen Reactions  . Penicillins Itching    Outpatient Meds: Current Outpatient Prescriptions  Medication Sig Dispense Refill  . docusate sodium (COLACE) 100 MG capsule Take 1 capsule (100 mg total) by mouth 2 (two) times daily. (Patient taking differently: Take 100 mg by mouth as needed. ) 60 capsule  11  . ibuprofen (ADVIL,MOTRIN) 800 MG tablet Take 1 tablet (800 mg total) by mouth every 8 (eight) hours as needed for headache or cramping. 30 tablet 5  . LO LOESTRIN FE 1 MG-10 MCG / 10 MCG tablet Take 1 tablet by mouth daily. 3 Package 4  . tinidazole (TINDAMAX) 500 MG tablet Take 2 tablets (1,000 mg total) by mouth daily with breakfast. 10 tablet 2   No current facility-administered medications for this visit.       ___________________________________________________________________ Objective   Exam:  BP (!) 86/64   Pulse 60   Ht 5\' 3"  (1.6  m)   Wt 173 lb 8 oz (78.7 kg)   LMP 01/05/2016 Comment: regular cycle  BMI 30.73 kg/m    General: this is a(n) Well-appearing young woman with good muscle mass   Eyes: sclera anicteric, no redness  ENT: oral mucosa moist without lesions, no cervical or supraclavicular lymphadenopathy, good dentition  CV: RRR without murmur, S1/S2, no JVD, no peripheral edema  Resp: clear to auscultation bilaterally, normal RR and effort noted  GI: soft, no tenderness, with active bowel sounds. No guarding or palpable organomegaly noted.  Skin; warm and dry, no rash or jaundice noted  Neuro: awake, alert and oriented x 3. Normal gross motor function and fluent speech  Labs:  Only recent labs were RPR, hepatitis serologies and HIV test  Assessment: Encounter Diagnoses  Name Primary?  . Slow transit constipation Yes  . LLQ abdominal pain     Sounds most like IBS, less likely pelvic floor dysfunction.  Plan:  Calcium and TSH level Plan outlined for treatment of constipation with OTC meds. She will call in a few weeks with an update. If not much improved, we can try Amitiza or Linzess. I do not think a colonoscopy will be revealing at this point.  Thank you for the courtesy of this consult.  Please call me with any questions or concerns.  Charlie Pitter III  CC: No PCP Per Patient

## 2016-02-02 ENCOUNTER — Encounter (HOSPITAL_COMMUNITY): Payer: Self-pay | Admitting: Emergency Medicine

## 2016-02-02 ENCOUNTER — Emergency Department (HOSPITAL_COMMUNITY)
Admission: EM | Admit: 2016-02-02 | Discharge: 2016-02-02 | Disposition: A | Discharge status: Home / Self Care | Payer: Medicaid Other | Attending: Emergency Medicine | Admitting: Emergency Medicine

## 2016-02-02 DIAGNOSIS — J069 Acute upper respiratory infection, unspecified: Secondary | ICD-10-CM | POA: Diagnosis not present

## 2016-02-02 DIAGNOSIS — Z79899 Other long term (current) drug therapy: Secondary | ICD-10-CM | POA: Diagnosis not present

## 2016-02-02 DIAGNOSIS — J029 Acute pharyngitis, unspecified: Secondary | ICD-10-CM | POA: Diagnosis present

## 2016-02-02 DIAGNOSIS — F17210 Nicotine dependence, cigarettes, uncomplicated: Secondary | ICD-10-CM | POA: Insufficient documentation

## 2016-02-02 LAB — RAPID STREP SCREEN (MED CTR MEBANE ONLY): Streptococcus, Group A Screen (Direct): NEGATIVE

## 2016-02-02 MED ORDER — ACETAMINOPHEN 500 MG PO TABS
1000.0000 mg | ORAL_TABLET | Freq: Once | ORAL | Status: AC
  Administered 2016-02-02: 1000 mg via ORAL
  Filled 2016-02-02: qty 2

## 2016-02-02 MED ORDER — PROMETHAZINE-DM 6.25-15 MG/5ML PO SYRP: 5.0000 mL | ORAL_SOLUTION | Freq: Four times a day (QID) | ORAL | 0 refills | Status: DC | PRN

## 2016-02-02 NOTE — ED Notes (Signed)
Patient Alert and oriented X4. Stable and ambulatory. Patient verbalized understanding of the discharge instructions.  Patient belongings were taken by the patient.  

## 2016-02-02 NOTE — ED Triage Notes (Signed)
Pt c/o body aches since yesterday. Denies any other symptoms.

## 2016-02-02 NOTE — ED Notes (Signed)
Patient is A&Ox4 at this time.  Patient in no signs of distress.  Please see providers note for complete history and physical exam.  

## 2016-02-02 NOTE — ED Provider Notes (Signed)
[facility]-EMERGENCY DEPT Provider Note   CSN: [id number] Arrival date & time: 02/02/16  1823   By signing my name below, I, Pamela Schaefer, attest that this documentation has been prepared under the direction and in the presence of [name], FNP. Electronically Signed: Clarisse GougeXavier Schaefer, Scribe. 02/02/16. 7:55 PM.   History   Chief Complaint Chief Complaint  Patient presents with  . Generalized Body Aches   The history is provided by the patient and medical records. No language interpreter was used.    HPI Comments: Pamela Schaefer is a 26 y.o. female who presents to the Emergency Department complaining of sore throat x 2 days. She reports associated right ear pain and generalized body aches. Pt denies N/V/D, rash, fever or chills. 1 sick contact noted at home with fever.  Past Medical History:  Diagnosis Date  . No pertinent past medical history   . PONV (postoperative nausea and vomiting)   . Pregnancy induced hypertension     Patient Active Problem List   Diagnosis Date Noted  . Acute appendicitis 10/25/2014  . Anxiety 11/15/2013  . GERD without esophagitis 07/31/2012    Past Surgical History:  Procedure Laterality Date  . CLAVICLE SURGERY    . INDUCED ABORTION    . LAPAROSCOPIC APPENDECTOMY N/A 10/25/2014   Procedure: APPENDECTOMY LAPAROSCOPIC;  Surgeon: Gaynelle AduEric Wilson, MD;  [facility];  Service: General;  Laterality: N/A;    OB History    Gravida Para Term Preterm AB Living   6 4 4  0 2 4   SAB TAB Ectopic Multiple Live Births   1 1 0 0 4       Home Medications    Prior to Admission medications   Medication Sig Start Date End Date Taking? Authorizing Provider  docusate sodium (COLACE) 100 MG capsule Take 1 capsule (100 mg total) by mouth 2 (two) times daily. Patient taking differently: Take 100 mg by mouth as needed.  01/15/16   Brock Badharles A Harper, MD  ibuprofen (ADVIL,MOTRIN) 800 MG tablet Take 1 tablet (800 mg total) by mouth every 8 (eight) hours as needed  for headache or cramping. 01/15/16   Brock Badharles A Harper, MD  LO LOESTRIN FE 1 MG-10 MCG / 10 MCG tablet Take 1 tablet by mouth daily. 01/15/16   Brock Badharles A Harper, MD  tinidazole [facility](TINDAMAX) 500 MG tablet Take 2 tablets (1,000 mg total) by mouth daily with breakfast. 01/19/16   Brock Badharles A Harper, MD    Family History Family History  Problem Relation Age of Onset  . Heart disease Maternal Grandfather   . Kidney disease Maternal Grandfather     failure  . Heart disease Father   . Diabetes Maternal Grandmother   . Hypertension Maternal Grandmother   . Colon cancer Neg Hx   . Stomach cancer Neg Hx   . Rectal cancer Neg Hx   . Esophageal cancer Neg Hx   . Liver cancer Neg Hx     Social History Social History  Substance Use Topics  . Smoking status: Current Some Day Smoker    Packs/day: 0.10    Types: Cigarettes  . Smokeless tobacco: Never Used  . Alcohol use 0.0 oz/week     Comment: social     Allergies   Penicillins   Review of Systems Review of Systems  All other systems reviewed and are negative.  A complete 10 system review of systems was obtained and all systems are negative except as noted in the HPI and PMH.  Physical Exam Updated Vital Signs BP 129/81 (BP Location: Left Arm)   Pulse 74   Temp 99 F (37.2 C) (Oral)   Resp 19   Ht 5\' 3"  (1.6 m)   Wt 173 lb (78.5 kg)   LMP 01/31/2016 Comment: regular cycle  SpO2 100%   BMI 30.65 kg/m   Physical Exam  Constitutional: She is oriented to person, place, and time. Vital signs are normal. She appears well-developed and well-nourished.  Non-toxic appearance. No distress.  Afebrile, nontoxic, NAD  HENT:  Head: Normocephalic and atraumatic.  Right Ear: Tympanic membrane is erythematous.  Left Ear: Tympanic membrane is erythematous.  Mouth/Throat: Uvula is midline and mucous membranes are normal. Posterior oropharyngeal erythema present.  Eyes: Conjunctivae and EOM are normal. Right eye exhibits no discharge. Left eye  exhibits no discharge.  Neck: Normal range of motion. Neck supple.  Cardiovascular: Normal rate, regular rhythm, normal heart sounds and intact distal pulses.   Pulmonary/Chest: Effort normal and breath sounds normal. No respiratory distress. She has no wheezes. She has no rales. She exhibits no tenderness.  Abdominal: Soft. Normal appearance. She exhibits no distension. There is no tenderness.  Musculoskeletal: Normal range of motion.  Neurological: She is alert and oriented to person, place, and time. She has normal strength. No sensory deficit.  Skin: Skin is warm, dry and intact. No rash noted.  Psychiatric: She has a normal mood and affect. Her behavior is normal.  Nursing note and vitals reviewed.    ED Treatments / Results  DIAGNOSTIC STUDIES: Oxygen Saturation is 100% on RA, normal by my interpretation.    COORDINATION OF CARE: 7:54 PM Discussed treatment plan with pt at bedside and pt agreed to plan. Will order strep test and reassess.  Labs (all labs ordered are listed, but only abnormal results are displayed) Labs Reviewed  RAPID STREP SCREEN (NOT AT [facility])  CULTURE, GROUP A STREP [facility])    EKG  EKG Interpretation None       Radiology No results found.  Procedures Procedures (including critical care time)  Medications Ordered in ED Medications - No data to display   Initial Impression / Assessment and Plan / ED Course  I have reviewed the triage vital signs and the nursing notes.  Pertinent labs & imaging results that were available during my care of the patient were reviewed by me and considered in my medical decision making (see chart for details).  Pt symptoms consistent with URI.  Pt will be discharged with symptomatic treatment.  Discussed return precautions.  Pt is hemodynamically stable & in NAD prior to discharge.    I personally performed the services described in this documentation, which was scribed in my presence. The recorded information has  been reviewed and is accurate.   Final Clinical Impressions(s) / ED Diagnoses   Final diagnoses:  Viral upper respiratory tract infection    New Prescriptions Discharge Medication List as of 02/02/2016  8:54 PM    START taking these medications   Details  promethazine-dextromethorphan (PROMETHAZINE-DM) 6.25-15 MG/5ML syrup Take 5 mLs by mouth 4 (four) times daily as needed for cough., Starting Tue 02/02/2016, Print         Felicie Morn, NP 02/02/16 2115    Margarita Grizzle, MD 02/03/16 [phone]

## 2016-02-05 LAB — CULTURE, GROUP A STREP (THRC)

## 2016-04-21 ENCOUNTER — Encounter (HOSPITAL_COMMUNITY): Payer: Self-pay | Admitting: *Deleted

## 2016-04-21 ENCOUNTER — Inpatient Hospital Stay (HOSPITAL_COMMUNITY)
Admission: AD | Admit: 2016-04-21 | Discharge: 2016-04-21 | Disposition: A | Discharge status: Home / Self Care | Payer: Medicaid Other | Source: Ambulatory Visit | Attending: Obstetrics and Gynecology | Admitting: Obstetrics and Gynecology

## 2016-04-21 DIAGNOSIS — R42 Dizziness and giddiness: Secondary | ICD-10-CM | POA: Insufficient documentation

## 2016-04-21 DIAGNOSIS — Z880 Allergy status to penicillin: Secondary | ICD-10-CM | POA: Diagnosis not present

## 2016-04-21 DIAGNOSIS — F17210 Nicotine dependence, cigarettes, uncomplicated: Secondary | ICD-10-CM | POA: Insufficient documentation

## 2016-04-21 DIAGNOSIS — R1032 Left lower quadrant pain: Secondary | ICD-10-CM | POA: Insufficient documentation

## 2016-04-21 DIAGNOSIS — K5900 Constipation, unspecified: Secondary | ICD-10-CM | POA: Diagnosis not present

## 2016-04-21 DIAGNOSIS — R109 Unspecified abdominal pain: Secondary | ICD-10-CM | POA: Diagnosis present

## 2016-04-21 LAB — WET PREP, GENITAL
Clue Cells Wet Prep HPF POC: NONE SEEN
Sperm: NONE SEEN
TRICH WET PREP: NONE SEEN
YEAST WET PREP: NONE SEEN

## 2016-04-21 LAB — URINALYSIS, ROUTINE W REFLEX MICROSCOPIC
BILIRUBIN URINE: NEGATIVE
Glucose, UA: NEGATIVE mg/dL
HGB URINE DIPSTICK: NEGATIVE
Ketones, ur: NEGATIVE mg/dL
Leukocytes, UA: NEGATIVE
Nitrite: NEGATIVE
PROTEIN: NEGATIVE mg/dL
Specific Gravity, Urine: 1.011 (ref 1.005–1.030)
pH: 6 (ref 5.0–8.0)

## 2016-04-21 LAB — CBC
HCT: 37 % (ref 36.0–46.0)
Hemoglobin: 12 g/dL (ref 12.0–15.0)
MCH: 28.6 pg (ref 26.0–34.0)
MCHC: 32.4 g/dL (ref 30.0–36.0)
MCV: 88.1 fL (ref 78.0–100.0)
Platelets: 260 10*3/uL (ref 150–400)
RBC: 4.2 MIL/uL (ref 3.87–5.11)
RDW: 15.6 % — ABNORMAL HIGH (ref 11.5–15.5)
WBC: 8.8 10*3/uL (ref 4.0–10.5)

## 2016-04-21 LAB — POCT PREGNANCY, URINE: PREG TEST UR: NEGATIVE

## 2016-04-21 MED ORDER — MECLIZINE HCL 32 MG PO TABS: 32.0000 mg | ORAL_TABLET | Freq: Three times a day (TID) | ORAL | 0 refills | Status: DC | PRN

## 2016-04-21 MED ORDER — MECLIZINE HCL 25 MG PO TABS: 25.0000 mg | ORAL_TABLET | Freq: Three times a day (TID) | ORAL | 0 refills | Status: DC | PRN

## 2016-04-21 NOTE — MAU Note (Signed)
c/o dizziness for 3 days; c/o abdominal aching that started today around 1500;

## 2016-04-21 NOTE — MAU Provider Note (Signed)
History     CSN: [id number]  Arrival date and time: 04/21/16 1656   First Provider Initiated Contact with Patient 04/21/16 1804      Chief Complaint  Patient presents with  . Dizziness  . Abdominal Pain   26 y.o. non-pregnant female here with dizziness and abdominal pain. Dizziness started 4 days ago. Associated sx are lightheadedness. No recent illness. No fever. Eating and drinking well. Abdominal pain happened once today. She describes as sharp in LMQ and lasted a few seconds. Has not returned. She reports hx of constipation and strained this am for BM. She is concerned that her menses is late. She took two HPT and both were negative. No vaginal discharge, itching, or odor.    Pertinent Gynecological History: Menses: 03/11/16 Contraception: none Sexually transmitted diseases: no past history  Past Medical History:  Diagnosis Date  . No pertinent past medical history   . PONV (postoperative nausea and vomiting)   . Pregnancy induced hypertension     Past Surgical History:  Procedure Laterality Date  . CLAVICLE SURGERY    . INDUCED ABORTION    . LAPAROSCOPIC APPENDECTOMY N/A 10/25/2014   Procedure: APPENDECTOMY LAPAROSCOPIC;  Surgeon: Gaynelle Adu, MD;  Location: [facility] OR;  Service: General;  Laterality: N/A;    Family History  Problem Relation Age of Onset  . Heart disease Maternal Grandfather   . Kidney disease Maternal Grandfather     failure  . Heart disease Father   . Diabetes Maternal Grandmother   . Hypertension Maternal Grandmother   . Colon cancer Neg Hx   . Stomach cancer Neg Hx   . Rectal cancer Neg Hx   . Esophageal cancer Neg Hx   . Liver cancer Neg Hx     Social History  Substance Use Topics  . Smoking status: Current Some Day Smoker    Packs/day: 0.25    Types: Cigarettes  . Smokeless tobacco: Current User  . Alcohol use 0.0 oz/week     Comment: social    Allergies:  Allergies  Allergen Reactions  . Penicillins Itching    Prescriptions  Prior to Admission  Medication Sig Dispense Refill Last Dose  . docusate sodium (COLACE) 100 MG capsule Take 1 capsule (100 mg total) by mouth 2 (two) times daily. (Patient taking differently: Take 100 mg by mouth as needed. ) 60 capsule 11 Taking  . ibuprofen (ADVIL,MOTRIN) 800 MG tablet Take 1 tablet (800 mg total) by mouth every 8 (eight) hours as needed for headache or cramping. 30 tablet 5 Taking  . LO LOESTRIN FE 1 MG-10 MCG / 10 MCG tablet Take 1 tablet by mouth daily. 3 Package 4 Taking  . promethazine-dextromethorphan (PROMETHAZINE-DM) 6.25-15 MG/5ML syrup Take 5 mLs by mouth 4 (four) times daily as needed for cough. 118 mL 0   . tinidazole (TINDAMAX) 500 MG tablet Take 2 tablets (1,000 mg total) by mouth daily with breakfast. 10 tablet 2 Taking    Review of Systems  Constitutional: Negative for fever.  HENT: Negative for congestion, ear discharge and ear pain.   Gastrointestinal: Positive for abdominal pain, constipation and nausea (today). Negative for vomiting.  Genitourinary: Negative for dysuria and vaginal discharge.  Neurological: Positive for dizziness and light-headedness.   Physical Exam   Blood pressure (!) 113/58, pulse 80, temperature 98.6 F (37 C), resp. rate 18, height  (1.6 m), weight 79.4 kg (175 lb), last menstrual period 03/11/2016.  Physical Exam  Constitutional: She is oriented to person, place, and  time. She appears well-developed and well-nourished. Distressed: appears comfortable.  HENT:  Head: Normocephalic and atraumatic.  Neck: Normal range of motion.  Cardiovascular: Normal rate.   Respiratory: Effort normal.  GI: Soft. She exhibits no distension and no mass. There is no tenderness. There is no rebound and no guarding.  Genitourinary:  Genitourinary Comments: External: no lesions or erythema Vagina: rugated, parous, thin yellow discharge Uterus: non enlarged, anteverted, non tender, no CMT Adnexae: no masses, + tenderness left, no tenderness  right   Musculoskeletal: Normal range of motion.  Neurological: She is alert and oriented to person, place, and time.  Skin: Skin is warm and dry.  Psychiatric: She has a normal mood and affect.   Results for orders placed or performed during the hospital encounter of 04/21/16 (from the past 24 hour(s))  Urinalysis, Routine w reflex microscopic     Status: Abnormal   Collection Time: 04/21/16  5:41 PM  Result Value Ref Range   Color, Urine STRAW (A) YELLOW   APPearance CLEAR CLEAR   Specific Gravity, Urine 1.011 1.005 - 1.030   pH 6.0 5.0 - 8.0   Glucose, UA NEGATIVE NEGATIVE mg/dL   Hgb urine dipstick NEGATIVE NEGATIVE   Bilirubin Urine NEGATIVE NEGATIVE   Ketones, ur NEGATIVE NEGATIVE mg/dL   Protein, ur NEGATIVE NEGATIVE mg/dL   Nitrite NEGATIVE NEGATIVE   Leukocytes, UA NEGATIVE NEGATIVE  Pregnancy, urine POC     Status: None   Collection Time: 04/21/16  5:50 PM  Result Value Ref Range   Preg Test, Ur NEGATIVE NEGATIVE  CBC     Status: Abnormal   Collection Time: 04/21/16  6:29 PM  Result Value Ref Range   WBC 8.8 4.0 - 10.5 K/uL   RBC 4.20 3.87 - 5.11 MIL/uL   Hemoglobin 12.0 12.0 - 15.0 g/dL   HCT 16.1 09.6 - 04.5 %   MCV 88.1 78.0 - 100.0 fL   MCH 28.6 26.0 - 34.0 pg   MCHC 32.4 30.0 - 36.0 g/dL   RDW 40.9 (H) 81.1 - 91.4 %   Platelets 260 150 - 400 K/uL  Wet prep, genital     Status: Abnormal   Collection Time: 04/21/16  6:40 PM  Result Value Ref Range   Yeast Wet Prep HPF POC NONE SEEN NONE SEEN   Trich, Wet Prep NONE SEEN NONE SEEN   Clue Cells Wet Prep HPF POC NONE SEEN NONE SEEN   WBC, Wet Prep HPF POC FEW (A) NONE SEEN   Sperm NONE SEEN    MAU Course  Procedures  MDM Labs ordered and reviewed. No evidence of acute abdominal or pelvic process. Pain likely caused by constipation. Dizziness possibly caused by virus, etiology is unclear. She is stable for discharge home.  Assessment and Plan   1. Left lower quadrant pain   2. Dizziness   3.       Constipation  Discharge home Follow up with PCP-establish care Return to MAU for OBGYN emergencies Rx Meclizine  Allergies as of 04/21/2016      Reactions   Penicillins Itching      Medication List    STOP taking these medications   promethazine-dextromethorphan 6.25-15 MG/5ML syrup Commonly known as:  PROMETHAZINE-DM   tinidazole 500 MG tablet Commonly known as:  TINDAMAX     TAKE these medications   docusate sodium 100 MG capsule Commonly known as:  COLACE Take 1 capsule (100 mg total) by mouth 2 (two) times daily. What changed:  when  to take this  reasons to take this   ibuprofen 800 MG tablet Commonly known as:  ADVIL,MOTRIN Take 1 tablet (800 mg total) by mouth every 8 (eight) hours as needed for headache or cramping.   LO LOESTRIN FE 1 MG-10 MCG / 10 MCG tablet Generic drug:  Norethindrone-Ethinyl Estradiol-Fe Biphas Take 1 tablet by mouth daily.   meclizine 32 MG tablet Commonly known as:  ANTIVERT Take 1 tablet (32 mg total) by mouth 3 (three) times daily as needed.   meclizine 25 MG tablet Commonly known as:  ANTIVERT Take 1 tablet (25 mg total) by mouth 3 (three) times daily as needed for dizziness.      Donette Larry, CNM 04/21/2016, 6:24 PM

## 2016-04-21 NOTE — MAU Note (Signed)
Pt has been feeling dizzy for the past 2 days. And stated having sharp  abd pain today. Took 2 HPT and they were negative

## 2016-04-21 NOTE — Discharge Instructions (Signed)

## 2016-04-22 LAB — GC/CHLAMYDIA PROBE AMP (~~LOC~~) NOT AT ARMC
Chlamydia: NEGATIVE
NEISSERIA GONORRHEA: NEGATIVE

## 2016-05-17 ENCOUNTER — Telehealth: Payer: Self-pay | Admitting: *Deleted

## 2016-05-17 NOTE — Telephone Encounter (Signed)
Pt called to office stating she needs medication.  Attempt to return call. No answer, LM on VM to call office.

## 2016-05-17 NOTE — Telephone Encounter (Signed)
Pt had returned call and left message stating she needs refill on Ibuprofen.  Attempt to contact pt.  LM on VM making pt aware she was given refills on last order. Advised to contact office if she has used refills and needs new Rx.

## 2016-10-25 ENCOUNTER — Ambulatory Visit: Payer: Self-pay | Admitting: Obstetrics

## 2016-11-07 ENCOUNTER — Encounter: Payer: Self-pay | Admitting: Obstetrics

## 2016-11-07 ENCOUNTER — Other Ambulatory Visit (HOSPITAL_COMMUNITY)
Admission: RE | Admit: 2016-11-07 | Discharge: 2016-11-07 | Disposition: A | Discharge status: Home / Self Care | Payer: Medicaid Other | Source: Ambulatory Visit | Attending: Obstetrics | Admitting: Obstetrics

## 2016-11-07 ENCOUNTER — Ambulatory Visit (INDEPENDENT_AMBULATORY_CARE_PROVIDER_SITE_OTHER): Payer: Medicaid Other | Admitting: Obstetrics

## 2016-11-07 VITALS — BP 101/67 | HR 78 | Ht 63.0 in | Wt 184.0 lb

## 2016-11-07 DIAGNOSIS — N939 Abnormal uterine and vaginal bleeding, unspecified: Secondary | ICD-10-CM | POA: Insufficient documentation

## 2016-11-07 DIAGNOSIS — Z30011 Encounter for initial prescription of contraceptive pills: Secondary | ICD-10-CM

## 2016-11-07 DIAGNOSIS — B3731 Acute candidiasis of vulva and vagina: Secondary | ICD-10-CM

## 2016-11-07 DIAGNOSIS — R12 Heartburn: Secondary | ICD-10-CM | POA: Insufficient documentation

## 2016-11-07 DIAGNOSIS — F17210 Nicotine dependence, cigarettes, uncomplicated: Secondary | ICD-10-CM | POA: Insufficient documentation

## 2016-11-07 DIAGNOSIS — B373 Candidiasis of vulva and vagina: Secondary | ICD-10-CM | POA: Insufficient documentation

## 2016-11-07 DIAGNOSIS — G44221 Chronic tension-type headache, intractable: Secondary | ICD-10-CM | POA: Insufficient documentation

## 2016-11-07 DIAGNOSIS — Z8249 Family history of ischemic heart disease and other diseases of the circulatory system: Secondary | ICD-10-CM | POA: Diagnosis not present

## 2016-11-07 DIAGNOSIS — Z0000 Encounter for general adult medical examination without abnormal findings: Secondary | ICD-10-CM

## 2016-11-07 DIAGNOSIS — Z01411 Encounter for gynecological examination (general) (routine) with abnormal findings: Secondary | ICD-10-CM | POA: Insufficient documentation

## 2016-11-07 DIAGNOSIS — Z01419 Encounter for gynecological examination (general) (routine) without abnormal findings: Secondary | ICD-10-CM

## 2016-11-07 DIAGNOSIS — Z309 Encounter for contraceptive management, unspecified: Secondary | ICD-10-CM | POA: Diagnosis not present

## 2016-11-07 DIAGNOSIS — N898 Other specified noninflammatory disorders of vagina: Secondary | ICD-10-CM | POA: Insufficient documentation

## 2016-11-07 DIAGNOSIS — Z3009 Encounter for other general counseling and advice on contraception: Secondary | ICD-10-CM

## 2016-11-07 MED ORDER — LO LOESTRIN FE 1 MG-10 MCG / 10 MCG PO TABS: 1.0000 | ORAL_TABLET | Freq: Every day | ORAL | 11 refills | Status: DC

## 2016-11-07 MED ORDER — BUTALBITAL-APAP-CAFFEINE 50-325-40 MG PO TABS: 2.0000 | ORAL_TABLET | Freq: Four times a day (QID) | ORAL | 2 refills | Status: AC | PRN

## 2016-11-07 MED ORDER — CLOTRIMAZOLE 1 % EX CREA: 1.0000 "application " | TOPICAL_CREAM | Freq: Two times a day (BID) | CUTANEOUS | 2 refills | Status: DC

## 2016-11-07 MED ORDER — RANITIDINE HCL 150 MG PO TABS: 150.0000 mg | ORAL_TABLET | Freq: Two times a day (BID) | ORAL | 5 refills | Status: DC

## 2016-11-07 NOTE — Progress Notes (Signed)
Presents for PAP/STD Testing.  Complains of intermittent lower abdominal pain mostly in the morning 8/10 for the past 30 days.  Vaginal itching x5 days, no odor, no discharge.  Period in September lasted for approximately 30 days.  Having constipation x 1 day.  Needs a PCP referral.

## 2016-11-07 NOTE — Progress Notes (Signed)
Subjective:        Pamela Schaefer is a 26 y.o. female here for a routine exam.  Current complaints: Prolonged period in September.  Diarrhea for past few months..    Personal health questionnaire:  Is patient Ashkenazi Jewish, have a family history of breast and/or ovarian cancer: no Is there a family history of uterine cancer diagnosed at age < 6350, gastrointestinal cancer, urinary tract cancer, family member who is a Personnel officerLynch syndrome-associated carrier: no Is the patient overweight and hypertensive, family history of diabetes, personal history of gestational diabetes, preeclampsia or PCOS: no Is patient over 2255, have PCOS,  family history of premature CHD under age 25, diabetes, smoke, have hypertension or peripheral artery disease:  no At any time, has a partner hit, kicked or otherwise hurt or frightened you?: no Over the past 2 weeks, have you felt down, depressed or hopeless?: no Over the past 2 weeks, have you felt little interest or pleasure in doing things?:no   Gynecologic History Patient's last menstrual period was 10/17/2016 (exact date). Contraception: none Last Pap: 2017. Results were: normal Last mammogram: n/a. Results were: n/a  Obstetric History OB History  Gravida Para Term Preterm AB Living  6 4 4  0 2 4  SAB TAB Ectopic Multiple Live Births  1 1 0 0 4    # Outcome Date GA Lbr Len/2nd Weight Sex Delivery Anes PTL Lv  6 Term 10/17/13 5134w1d 08:24 / 00:33 6 lb 4 oz (2.835 kg) F Vag-Spont EPI  LIV  5 Term 12/08/12 1440w0d 21:00 / 00:29 7 lb 12.3 oz (3.525 kg) F Vag-Spont EPI  LIV     Birth Comments: none  4 Term 04/25/11 7479w3d 08:43 / 00:17 8 lb 8 oz (3.856 kg) F Vag-Spont EPI  LIV     Birth Comments: NA  3 Term 09/21/07 6324w0d 08:00 6 lb 6 oz (2.892 kg) M Vag-Spont EPI  LIV  2 TAB           1 SAB              Birth Comments: System Generated. Please review and update pregnancy details.      Past Medical History:  Diagnosis Date  . No pertinent past  medical history   . PONV (postoperative nausea and vomiting)   . Pregnancy induced hypertension     Past Surgical History:  Procedure Laterality Date  . CLAVICLE SURGERY    . INDUCED ABORTION       Current Outpatient Medications:  .  docusate sodium (COLACE) 100 MG capsule, Take 1 capsule (100 mg total) by mouth 2 (two) times daily. (Patient taking differently: Take 100 mg by mouth as needed. ), Disp: 60 capsule, Rfl: 11 .  ibuprofen (ADVIL,MOTRIN) 800 MG tablet, Take 1 tablet (800 mg total) by mouth every 8 (eight) hours as needed for headache or cramping., Disp: 30 tablet, Rfl: 5 .  LO LOESTRIN FE 1 MG-10 MCG / 10 MCG tablet, Take 1 tablet by mouth daily., Disp: 3 Package, Rfl: 4 .  meclizine (ANTIVERT) 25 MG tablet, Take 1 tablet (25 mg total) by mouth 3 (three) times daily as needed for dizziness., Disp: 30 tablet, Rfl: 0 .  meclizine (ANTIVERT) 32 MG tablet, Take 1 tablet (32 mg total) by mouth 3 (three) times daily as needed., Disp: 30 tablet, Rfl: 0 Allergies  Allergen Reactions  . Penicillins Itching    Social History   Tobacco Use  . Smoking status: Current Some Day  Smoker    Packs/day: 0.25    Types: Cigarettes  . Smokeless tobacco: Current User  Substance Use Topics  . Alcohol use: Yes    Alcohol/week: 0.0 oz    Comment: social    Family History  Problem Relation Age of Onset  . Heart disease Maternal Grandfather   . Kidney disease Maternal Grandfather        failure  . Heart disease Father   . Diabetes Maternal Grandmother   . Hypertension Maternal Grandmother   . Colon cancer Neg Hx   . Stomach cancer Neg Hx   . Rectal cancer Neg Hx   . Esophageal cancer Neg Hx   . Liver cancer Neg Hx       Review of Systems  Constitutional: negative for fatigue and weight loss Respiratory: negative for cough and wheezing Cardiovascular: negative for chest pain, fatigue and palpitations Gastrointestinal: positive for constipation and abdominal pain.  Also has  heartburn. Musculoskeletal:negative for myalgias Neurological: positive for headaches Behavioral/Psych: negative Endocrine: negative for temperature intolerance    Genitourinary:positive for abnormal menstrual periods and vaginal itching on the outside Integument/breast: negative for breast lump, breast tenderness, nipple discharge and skin lesion(s)    Objective:       BP 101/67   Pulse 78   Ht 5\' 3"  (1.6 m)   Wt 184 lb (83.5 kg)   LMP 10/17/2016 (Exact Date)   BMI 32.59 kg/m  General:   alert  Skin:   no rash or abnormalities  Lungs:   clear to auscultation bilaterally  Heart:   regular rate and rhythm, S1, S2 normal, no murmur, click, rub or gallop  Breasts:   normal without suspicious masses, skin or nipple changes or axillary nodes  Abdomen:  normal findings: no organomegaly, soft, non-tender and no hernia  Pelvis:  External genitalia: normal general appearance Urinary system: urethral meatus normal and bladder without fullness, nontender Vaginal: normal without tenderness, induration or masses Cervix: normal appearance Adnexa: normal bimanual exam Uterus: anteverted and non-tender, normal size   Lab Review Urine pregnancy test Labs reviewed yes Radiologic studies reviewed no  50% of 20 min visit spent on counseling and coordination of care.    Assessment:     1. Encounter for routine gynecological examination with Papanicolaou smear of cervix Rx: - Pap Smear  2. Vaginal discharge - cervico-vaginal ancillary only  3. Candida vulvitis  Rx: - clotrimazole (LOTRIMIN) 1 % cream; Apply 1 application 2 (two) times daily topically.  4. Abnormal uterine bleeding (AUB) - OCP's Rx  5. Chronic tension-type headache, intractable Rx: - butalbital-acetaminophen-caffeine (FIORICET, ESGIC) 50-325-40 MG tablet; Take 2 tablets every 6 (six) hours as needed by mouth for headache.  6. Heartburn Rx: - ranitidine (ZANTAC) 150 MG tablet; Take 1 tablet (150 mg total) 2 (two)  times daily by mouth.  7. Encounter for other general counseling and advice on contraception Rx: - LO LOESTRIN FE 1 MG-10 MCG / 10 MCG tablet; Take 1 tablet daily by mouth. Start taking pills on 1st day of period.  8. Encounter for initial prescription of contraceptive pills Rx: - LO LOESTRIN FE 1 MG-10 MCG / 10 MCG tablet; Take 1 tablet daily by mouth. Start taking pills on 1st day of period.  9. Routine adult health maintenance - Ambulatory referral to Internal Medicine   Plan:    Education reviewed: calcium supplements, depression evaluation, low fat, low cholesterol diet, safe sex/STD prevention, self breast exams and weight bearing exercise. Contraception: OCP (estrogen/progesterone). Follow  up in: 1 year.   No orders of the defined types were placed in this encounter.  No orders of the defined types were placed in this encounter.

## 2016-11-09 LAB — CYTOLOGY - PAP: DIAGNOSIS: NEGATIVE

## 2016-11-09 LAB — CERVICOVAGINAL ANCILLARY ONLY
Bacterial vaginitis: POSITIVE — AB
CHLAMYDIA, DNA PROBE: NEGATIVE
Candida vaginitis: NEGATIVE
NEISSERIA GONORRHEA: NEGATIVE
Trichomonas: NEGATIVE

## 2016-11-10 ENCOUNTER — Other Ambulatory Visit: Payer: Self-pay | Admitting: Obstetrics

## 2016-11-10 DIAGNOSIS — N760 Acute vaginitis: Principal | ICD-10-CM

## 2016-11-10 DIAGNOSIS — B9689 Other specified bacterial agents as the cause of diseases classified elsewhere: Secondary | ICD-10-CM

## 2016-11-10 MED ORDER — SECNIDAZOLE 2 G PO PACK: 1.0000 | PACK | Freq: Once | ORAL | 2 refills | Status: AC

## 2016-12-14 ENCOUNTER — Telehealth: Payer: Self-pay

## 2016-12-14 NOTE — Telephone Encounter (Signed)
Rec'vd message from pt. LVM for pt to c/b

## 2017-02-09 ENCOUNTER — Telehealth: Payer: Self-pay

## 2017-02-09 ENCOUNTER — Other Ambulatory Visit: Payer: Self-pay | Admitting: Obstetrics

## 2017-02-09 DIAGNOSIS — N946 Dysmenorrhea, unspecified: Secondary | ICD-10-CM

## 2017-02-09 NOTE — Telephone Encounter (Signed)
Request for Ibuprofen 800 mg tablet  Pt had AEX 11/07/2016  Please advise.

## 2017-02-10 ENCOUNTER — Other Ambulatory Visit: Payer: Self-pay | Admitting: Obstetrics

## 2017-02-10 DIAGNOSIS — N946 Dysmenorrhea, unspecified: Secondary | ICD-10-CM

## 2017-02-10 MED ORDER — IBUPROFEN 800 MG PO TABS: 800.0000 mg | ORAL_TABLET | Freq: Three times a day (TID) | ORAL | 5 refills | Status: DC | PRN

## 2017-02-10 NOTE — Telephone Encounter (Signed)
Ibuprofen Rx. 

## 2017-04-20 ENCOUNTER — Encounter (HOSPITAL_COMMUNITY): Payer: Self-pay | Admitting: *Deleted

## 2017-04-20 ENCOUNTER — Inpatient Hospital Stay (HOSPITAL_COMMUNITY)
Admission: AD | Admit: 2017-04-20 | Discharge: 2017-04-20 | Disposition: A | Discharge status: Home / Self Care | Payer: Medicaid Other | Source: Ambulatory Visit | Attending: Obstetrics & Gynecology | Admitting: Obstetrics & Gynecology

## 2017-04-20 DIAGNOSIS — B3731 Acute candidiasis of vulva and vagina: Secondary | ICD-10-CM

## 2017-04-20 DIAGNOSIS — Z87891 Personal history of nicotine dependence: Secondary | ICD-10-CM | POA: Insufficient documentation

## 2017-04-20 DIAGNOSIS — B373 Candidiasis of vulva and vagina: Secondary | ICD-10-CM | POA: Insufficient documentation

## 2017-04-20 DIAGNOSIS — Z3201 Encounter for pregnancy test, result positive: Secondary | ICD-10-CM

## 2017-04-20 DIAGNOSIS — Z8249 Family history of ischemic heart disease and other diseases of the circulatory system: Secondary | ICD-10-CM | POA: Insufficient documentation

## 2017-04-20 DIAGNOSIS — O9989 Other specified diseases and conditions complicating pregnancy, childbirth and the puerperium: Secondary | ICD-10-CM

## 2017-04-20 DIAGNOSIS — Z880 Allergy status to penicillin: Secondary | ICD-10-CM | POA: Insufficient documentation

## 2017-04-20 DIAGNOSIS — K219 Gastro-esophageal reflux disease without esophagitis: Secondary | ICD-10-CM | POA: Insufficient documentation

## 2017-04-20 DIAGNOSIS — R0789 Other chest pain: Secondary | ICD-10-CM | POA: Insufficient documentation

## 2017-04-20 LAB — URINALYSIS, ROUTINE W REFLEX MICROSCOPIC
Bilirubin Urine: NEGATIVE
Glucose, UA: NEGATIVE mg/dL
Hgb urine dipstick: NEGATIVE
KETONES UR: NEGATIVE mg/dL
Nitrite: NEGATIVE
PROTEIN: NEGATIVE mg/dL
Specific Gravity, Urine: 1.023 (ref 1.005–1.030)
pH: 5 (ref 5.0–8.0)

## 2017-04-20 LAB — COMPREHENSIVE METABOLIC PANEL
ALK PHOS: 41 U/L (ref 38–126)
ALT: 12 U/L — ABNORMAL LOW (ref 14–54)
ANION GAP: 8 (ref 5–15)
AST: 38 U/L (ref 15–41)
Albumin: 3.8 g/dL (ref 3.5–5.0)
BILIRUBIN TOTAL: 0.4 mg/dL (ref 0.3–1.2)
BUN: 12 mg/dL (ref 6–20)
CALCIUM: 9 mg/dL (ref 8.9–10.3)
CO2: 23 mmol/L (ref 22–32)
Chloride: 106 mmol/L (ref 101–111)
Creatinine, Ser: 0.68 mg/dL (ref 0.44–1.00)
GFR calc non Af Amer: >60 mL/min (ref >60)
Glucose, Bld: 91 mg/dL (ref 65–99)
POTASSIUM: 3.8 mmol/L (ref 3.5–5.1)
Sodium: 137 mmol/L (ref 135–145)
TOTAL PROTEIN: 7.1 g/dL (ref 6.5–8.1)

## 2017-04-20 LAB — CBC
HCT: 33.3 % — ABNORMAL LOW (ref 36.0–46.0)
Hemoglobin: 11.2 g/dL — ABNORMAL LOW (ref 12.0–15.0)
MCH: 28.9 pg (ref 26.0–34.0)
MCHC: 33.6 g/dL (ref 30.0–36.0)
MCV: 86 fL (ref 78.0–100.0)
PLATELETS: 239 10*3/uL (ref 150–400)
RBC: 3.87 MIL/uL (ref 3.87–5.11)
RDW: 14.6 % (ref 11.5–15.5)
WBC: 11.2 10*3/uL — AB (ref 4.0–10.5)

## 2017-04-20 LAB — WET PREP, GENITAL
CLUE CELLS WET PREP: NONE SEEN
Sperm: NONE SEEN
TRICH WET PREP: NONE SEEN

## 2017-04-20 LAB — TROPONIN I

## 2017-04-20 LAB — POCT PREGNANCY, URINE: PREG TEST UR: POSITIVE — AB

## 2017-04-20 MED ORDER — GI COCKTAIL ~~LOC~~
30.0000 mL | Freq: Once | ORAL | Status: AC
  Administered 2017-04-20: 30 mL via ORAL
  Filled 2017-04-20: qty 30

## 2017-04-20 MED ORDER — MICONAZOLE NITRATE 2 % VA CREA: 1.0000 | TOPICAL_CREAM | Freq: Every day | VAGINAL | 0 refills | Status: DC

## 2017-04-20 MED ORDER — OMEPRAZOLE 40 MG PO CPDR: 40.0000 mg | DELAYED_RELEASE_CAPSULE | Freq: Every day | ORAL | 1 refills | Status: DC

## 2017-04-20 MED ORDER — MICONAZOLE NITRATE 2 % VA CREA: 1.0000 | TOPICAL_CREAM | Freq: Every day | VAGINAL | 0 refills | Status: AC

## 2017-04-20 NOTE — MAU Provider Note (Signed)
History     CSN: [id number]  Arrival date and time: 04/20/17 2037   First Provider Initiated Contact with Patient 04/20/17 2106      No chief complaint on file.  HPI Pamela Schaefer is a 27 y.o. [phone] who presents to MAU with complaints of intermittent central chest pain that started yesterday.  Chest pain episodes last for a couple seconds.  Episodes occur at rest and while exerting herself. Has never ahd this kind of pain before. Patient also describing right arm numbness that started today.  States that it feels tingly. She has bad heartburn and indigestion for years.  States that she has tried taking Zantac and Tums without relief.  Has tried to improve her diet but still continues to have symptoms. Has had nausea but no emesis.   Patient also complaining of vaginal discharge that is white and clumpy.  Also describing some irritation to the area. Denies fevers, vaginal bleeding, abdominal pain.   Patient states that she has had increased stress and anxiety lately.  Believes she is pregnant because she took a home pregnancy test on April 6 that was positive.   Past Medical History:  Diagnosis Date  . No pertinent past medical history   . PONV (postoperative nausea and vomiting)   . Pregnancy induced hypertension     Past Surgical History:  Procedure Laterality Date  . CLAVICLE SURGERY    . INDUCED ABORTION    . LAPAROSCOPIC APPENDECTOMY N/A 10/25/2014   Procedure: APPENDECTOMY LAPAROSCOPIC;  Surgeon: Gaynelle Adu, MD;  Location: [facility] OR;  Service: General;  Laterality: N/A;    Family History  Problem Relation Age of Onset  . Heart disease Maternal Grandfather   . Kidney disease Maternal Grandfather        failure  . Heart disease Father   . Diabetes Maternal Grandmother   . Hypertension Maternal Grandmother   . Colon cancer Neg Hx   . Stomach cancer Neg Hx   . Rectal cancer Neg Hx   . Esophageal cancer Neg Hx   . Liver cancer Neg Hx     Social History    Tobacco Use  . Smoking status: Former Smoker    Packs/day: 0.25    Types: Cigarettes  . Smokeless tobacco: Former Engineer, water Use Topics  . Alcohol use: Yes    Alcohol/week: 0.0 oz    Comment: social  . Drug use: No    Allergies:  Allergies  Allergen Reactions  . Penicillins Itching    Medications Prior to Admission  Medication Sig Dispense Refill Last Dose  . butalbital-acetaminophen-caffeine (FIORICET, ESGIC) 50-325-40 MG tablet Take 2 tablets every 6 (six) hours as needed by mouth for headache. 30 tablet 2   . clotrimazole (LOTRIMIN) 1 % cream Apply 1 application 2 (two) times daily topically. 30 g 2   . docusate sodium (COLACE) 100 MG capsule Take 1 capsule (100 mg total) by mouth 2 (two) times daily. (Patient taking differently: Take 100 mg by mouth as needed. ) 60 capsule 11 Taking  . ibuprofen (ADVIL,MOTRIN) 800 MG tablet Take 1 tablet (800 mg total) by mouth every 8 (eight) hours as needed for headache or cramping. 30 tablet 5   . LO LOESTRIN FE 1 MG-10 MCG / 10 MCG tablet Take 1 tablet daily by mouth. Start taking pills on 1st day of period. 1 Package 11   . meclizine (ANTIVERT) 25 MG tablet Take 1 tablet (25 mg total) by mouth 3 (three) times daily  as needed for dizziness. 30 tablet 0   . meclizine (ANTIVERT) 32 MG tablet Take 1 tablet (32 mg total) by mouth 3 (three) times daily as needed. 30 tablet 0   . ranitidine (ZANTAC) 150 MG tablet Take 1 tablet (150 mg total) 2 (two) times daily by mouth. 60 tablet 5     Review of Systems  All systems reviewed and are negative for acute change except as noted in the HPI.  Physical Exam   Blood pressure 113/74, pulse 67, temperature 99 F (37.2 C), temperature source Oral, resp. rate 18, height 5\' 3"  (1.6 m), weight 83.1 kg (183 lb 4 oz), last menstrual period 03/08/2017, SpO2 100 %.  Physical Exam  Nursing note and vitals reviewed. Constitutional: She is oriented to person, place, and time. She appears well-developed  and well-nourished. No distress.  HENT:  Head: Normocephalic and atraumatic.  Mouth/Throat: Oropharynx is clear and moist.  Eyes: Conjunctivae and EOM are normal.  Neck: Normal range of motion. Neck supple.  Cardiovascular: Normal rate, regular rhythm and intact distal pulses.  Respiratory: Effort normal and breath sounds normal. She exhibits no tenderness.  GI: Soft. She exhibits no distension. There is no tenderness. There is no guarding.  Genitourinary: Vaginal discharge found.  Musculoskeletal: Normal range of motion. She exhibits no edema or tenderness.  Neurological: She is alert and oriented to person, place, and time. No cranial nerve deficit. She exhibits normal muscle tone.  Strength 5/5. Sensory changes to right arm.  Skin: Skin is warm and dry.  Psychiatric: She has a normal mood and affect.   Results for orders placed or performed during the hospital encounter of 04/20/17  Wet prep, genital  Result Value Ref Range   Yeast Wet Prep HPF POC PRESENT (A) NONE SEEN   Trich, Wet Prep NONE SEEN NONE SEEN   Clue Cells Wet Prep HPF POC NONE SEEN NONE SEEN   WBC, Wet Prep HPF POC FEW (A) NONE SEEN   Sperm NONE SEEN   Urinalysis, Routine w reflex microscopic  Result Value Ref Range   Color, Urine YELLOW YELLOW   APPearance HAZY (A) CLEAR   Specific Gravity, Urine 1.023 1.005 - 1.030   pH 5.0 5.0 - 8.0   Glucose, UA NEGATIVE NEGATIVE mg/dL   Hgb urine dipstick NEGATIVE NEGATIVE   Bilirubin Urine NEGATIVE NEGATIVE   Ketones, ur NEGATIVE NEGATIVE mg/dL   Protein, ur NEGATIVE NEGATIVE mg/dL   Nitrite NEGATIVE NEGATIVE   Leukocytes, UA LARGE (A) NEGATIVE   RBC / HPF 0-5 0 - 5 RBC/hpf   WBC, UA 6-30 0 - 5 WBC/hpf   Bacteria, UA RARE (A) NONE SEEN   Squamous Epithelial / LPF 0-5 (A) NONE SEEN   Mucus PRESENT   Comprehensive metabolic panel  Result Value Ref Range   Sodium 137 135 - 145 mmol/L   Potassium 3.8 3.5 - 5.1 mmol/L   Chloride 106 101 - 111 mmol/L   CO2 23 22 - 32  mmol/L   Glucose, Bld 91 65 - 99 mg/dL   BUN 12 6 - 20 mg/dL   Creatinine, Ser 1.61 0.44 - 1.00 mg/dL   Calcium 9.0 8.9 - 09.6 mg/dL   Total Protein 7.1 6.5 - 8.1 g/dL   Albumin 3.8 3.5 - 5.0 g/dL   AST 38 15 - 41 U/L   ALT 12 (L) 14 - 54 U/L   Alkaline Phosphatase 41 38 - 126 U/L   Total Bilirubin 0.4 0.3 - 1.2 mg/dL  GFR calc non Af Amer >60 >60 mL/min   GFR calc Af Amer >60 >60 mL/min   Anion gap 8 5 - 15  Troponin I  Result Value Ref Range   Troponin I <0.03 <0.03 ng/mL  CBC  Result Value Ref Range   WBC 11.2 (H) 4.0 - 10.5 K/uL   RBC 3.87 3.87 - 5.11 MIL/uL   Hemoglobin 11.2 (L) 12.0 - 15.0 g/dL   HCT 21.333.3 (L) 08.636.0 - 57.846.0 %   MCV 86.0 78.0 - 100.0 fL   MCH 28.9 26.0 - 34.0 pg   MCHC 33.6 30.0 - 36.0 g/dL   RDW 46.914.6 62.911.5 - 52.815.5 %   Platelets 239 150 - 400 K/uL  Pregnancy, urine POC  Result Value Ref Range   Preg Test, Ur POSITIVE (A) NEGATIVE     MAU Course  Procedures  MDM Upreg positive with 8246w1d by LMP - no early pregnancy concerns Wet prep with yeast UA with large leukocytes; urine sent for culture. Will hold off on treatment at this time unless culture positive. EKG with NSR with 1st degree AV-block  -Discussed with on-call cardiologist which stated that this could be a normal variant in an otherwise healthy young female with high vagal tone. Chest pain does not seem cardiac.  HEART score 0. With low likelihood of this being a cardiac event. Chest pain likely due to indigestion  Treatments in MAU: -GI cocktail > improvement in symptoms   Assessment and Plan   1. Other chest pain   2. Gastroesophageal reflux disease, esophagitis presence not specified   3. Positive urine pregnancy test   4. Vaginal candidiasis    Discharge home in stable condition Rx for omeprazole Rx for miconazole Establish OB care Handout of medications safe in pregnancy given Return OB and chest pain precautions given  Caryl AdaJazma Kalijah Westfall, DO 04/20/2017, 9:18 PM

## 2017-04-20 NOTE — MAU Note (Signed)
PT SAYS  SHE HAS NUMBNESS IN HER  RIGHT ARM - STARTED IN CAR ON WAY HERE.      SAYS YESTERDAY  SHE HAD  HEARTBURN AND INDIGESTION- BUT  YESTERDAY HAD  NEW CHEST PAIN-  LASTED  10 SEC.     THEN TODAY -  HAD CP AT WORK  X1 ,   HOME X1 ,  IN WAITING  AREA X1-    LASTED 10 SEC.   TOOK ZANTAC-   AT 750PM-   NO RELIEF - STILL HAS HEARTBURN .   ATE PIZZA AT 7 PM          ALSO HAS VAG D/C - NOTICED TODAY- IRRITATED.    ON 4-6- HAD POSITIVE HPT -

## 2017-04-20 NOTE — Discharge Instructions (Signed)
[employer] for discount codes for medications  Need to establish prenatal care   First Trimester of Pregnancy The first trimester of pregnancy is from week 1 until the end of week 13 (months 1 through 3). During this time, your baby will begin to develop inside you. At 6-8 weeks, the eyes and face are formed, and the heartbeat can be seen on ultrasound. At the end of 12 weeks, all the baby's organs are formed. Prenatal care is all the medical care you receive before the birth of your baby. Make sure you get good prenatal care and follow all of your doctor's instructions. Follow these instructions at home: Medicines  Take over-the-counter and prescription medicines only as told by your doctor. Some medicines are safe and some medicines are not safe during pregnancy.  Take a prenatal vitamin that contains at least 600 micrograms (mcg) of folic acid.  If you have trouble pooping (constipation), take medicine that will make your stool soft (stool softener) if your doctor approves. Eating and drinking  Eat regular, healthy meals.  Your doctor will tell you the amount of weight gain that is right for you.  Avoid raw meat and uncooked cheese.  If you feel sick to your stomach (nauseous) or throw up (vomit): ? Eat 4 or 5 small meals a day instead of 3 large meals. ? Try eating a few soda crackers. ? Drink liquids between meals instead of during meals.  To prevent constipation: ? Eat foods that are high in fiber, like fresh fruits and vegetables, whole grains, and beans. ? Drink enough fluids to keep your pee (urine) clear or pale yellow. Activity  Exercise only as told by your doctor. Stop exercising if you have cramps or pain in your lower belly (abdomen) or low back.  Do not exercise if it is too hot, too humid, or if you are in a place of great height (high altitude).  Try to avoid standing for long periods of time. Move your legs often if you must stand in one place for a long  time.  Avoid heavy lifting.  Wear low-heeled shoes. Sit and stand up straight.  You can have sex unless your doctor tells you not to. Relieving pain and discomfort  Wear a good support bra if your breasts are sore.  Take warm water baths (sitz baths) to soothe pain or discomfort caused by hemorrhoids. Use hemorrhoid cream if your doctor says it is okay.  Rest with your legs raised if you have leg cramps or low back pain.  If you have puffy, bulging veins (varicose veins) in your legs: ? Wear support hose or compression stockings as told by your doctor. ? Raise (elevate) your feet for 15 minutes, 3-4 times a day. ? Limit salt in your food. Prenatal care  Schedule your prenatal visits by the twelfth week of pregnancy.  Write down your questions. Take them to your prenatal visits.  Keep all your prenatal visits as told by your doctor. This is important. Safety  Wear your seat belt at all times when driving.  Make a list of emergency phone numbers. The list should include numbers for family, friends, the hospital, and police and fire departments. General instructions  Ask your doctor for a referral to a local prenatal class. Begin classes no later than at the start of month 6 of your pregnancy.  Ask for help if you need counseling or if you need help with nutrition. Your doctor can give you advice or tell you  where to go for help.  Do not use hot tubs, steam rooms, or saunas.  Do not douche or use tampons or scented sanitary pads.  Do not cross your legs for long periods of time.  Avoid all herbs and alcohol. Avoid drugs that are not approved by your doctor.  Do not use any tobacco products, including cigarettes, chewing tobacco, and electronic cigarettes. If you need help quitting, ask your doctor. You may get counseling or other support to help you quit.  Avoid cat litter boxes and soil used by cats. These carry germs that can cause birth defects in the baby and can cause  a loss of your baby (miscarriage) or stillbirth.  Visit your dentist. At home, brush your teeth with a soft toothbrush. Be gentle when you floss. Contact a doctor if:  You are dizzy.  You have mild cramps or pressure in your lower belly.  You have a nagging pain in your belly area.  You continue to feel sick to your stomach, you throw up, or you have watery poop (diarrhea).  You have a bad smelling fluid coming from your vagina.  You have pain when you pee (urinate).  You have increased puffiness (swelling) in your face, hands, legs, or ankles. Get help right away if:  You have a fever.  You are leaking fluid from your vagina.  You have spotting or bleeding from your vagina.  You have very bad belly cramping or pain.  You gain or lose weight rapidly.  You throw up blood. It may look like coffee grounds.  You are around people who have [place] measles, fifth disease, or chickenpox.  You have a very bad headache.  You have shortness of breath.  You have any kind of trauma, such as from a fall or a car accident. Summary  The first trimester of pregnancy is from week 1 until the end of week 13 (months 1 through 3).  To take care of yourself and your unborn baby, you will need to eat healthy meals, take medicines only if your doctor tells you to do so, and do activities that are safe for you and your baby.  Keep all follow-up visits as told by your doctor. This is important as your doctor will have to ensure that your baby is healthy and growing well. This information is not intended to replace advice given to you by your health care provider. Make sure you discuss any questions you have with your health care provider. Document Released: 06/08/2007 Document Revised: 12/29/2015 Document Reviewed: 12/29/2015 Elsevier Interactive Patient Education  2017 [employer].

## 2017-04-21 LAB — GC/CHLAMYDIA PROBE AMP (~~LOC~~) NOT AT ARMC
Chlamydia: NEGATIVE
NEISSERIA GONORRHEA: NEGATIVE

## 2017-04-22 LAB — CULTURE, OB URINE: CULTURE: NO GROWTH

## 2018-02-25 ENCOUNTER — Encounter (HOSPITAL_COMMUNITY): Payer: Self-pay | Admitting: Emergency Medicine

## 2018-02-25 ENCOUNTER — Other Ambulatory Visit: Payer: Self-pay

## 2018-02-25 ENCOUNTER — Emergency Department (HOSPITAL_COMMUNITY): Payer: Medicaid Other

## 2018-02-25 ENCOUNTER — Emergency Department (HOSPITAL_COMMUNITY)
Admission: EM | Admit: 2018-02-25 | Discharge: 2018-02-25 | Disposition: A | Discharge status: Home / Self Care | Payer: Medicaid Other | Attending: Emergency Medicine | Admitting: Emergency Medicine

## 2018-02-25 DIAGNOSIS — J020 Streptococcal pharyngitis: Secondary | ICD-10-CM | POA: Insufficient documentation

## 2018-02-25 DIAGNOSIS — H9209 Otalgia, unspecified ear: Secondary | ICD-10-CM | POA: Insufficient documentation

## 2018-02-25 DIAGNOSIS — R0981 Nasal congestion: Secondary | ICD-10-CM | POA: Insufficient documentation

## 2018-02-25 LAB — INFLUENZA PANEL BY PCR (TYPE A & B)
INFLAPCR: NEGATIVE
INFLBPCR: NEGATIVE

## 2018-02-25 LAB — POC URINE PREG, ED: Preg Test, Ur: NEGATIVE

## 2018-02-25 LAB — GROUP A STREP BY PCR: GROUP A STREP BY PCR: DETECTED — AB

## 2018-02-25 MED ORDER — LIDOCAINE VISCOUS HCL 2 % MT SOLN: 15.0000 mL | OROMUCOSAL | 0 refills | Status: DC | PRN

## 2018-02-25 MED ORDER — AZITHROMYCIN 250 MG PO TABS
500.0000 mg | ORAL_TABLET | Freq: Once | ORAL | Status: AC
  Administered 2018-02-25: 500 mg via ORAL
  Filled 2018-02-25: qty 2

## 2018-02-25 MED ORDER — AZITHROMYCIN 500 MG PO TABS: 500.0000 mg | ORAL_TABLET | Freq: Every day | ORAL | 0 refills | Status: AC

## 2018-02-25 MED ORDER — DEXAMETHASONE 1 MG/ML PO CONC
10.0000 mg | Freq: Once | ORAL | Status: AC
  Administered 2018-02-25: 10 mg via ORAL
  Filled 2018-02-25: qty 10

## 2018-02-25 NOTE — ED Triage Notes (Signed)
C/o sore throat and bilateral ear pain since yesterday.

## 2018-02-25 NOTE — Discharge Instructions (Signed)
You have been diagnosed today with strep throat.  At this time there does not appear to be the presence of an emergent medical condition, however there is always the potential for conditions to change. Please read and follow the below instructions.  Please return to the Emergency Department immediately for any new or worsening symptoms or if your symptoms do not improve within 3 days. Please be sure to follow up with your Primary Care Provider within one week regarding your visit today; please call their office to schedule an appointment even if you are feeling better for a follow-up visit. You have been given your first dose of antibiotic azithromycin today.  Please complete your course of antibiotic, 500 mg once daily for the next 4 days starting tomorrow. You may use the viscous lidocaine mouth rinse to help with your pain.  Do not swallow the lidocaine rinse. Please drink plenty water and get plenty of rest over the next few days to help with your symptoms.  You may use over-the-counter Tylenol and ibuprofen as directed on the packaging to help with your symptoms.  Get help right away if: You have new symptoms, such as vomiting, severe headache, stiff or painful neck, chest pain, or shortness of breath. You have severe throat pain, drooling, or changes in your voice. You have swelling of the neck, or the skin on the neck becomes red and tender. You have signs of dehydration, such as fatigue, dry mouth, and decreased urination. You become increasingly sleepy, or you cannot wake up completely. Your joints become red or painful. Any new or concerning symptoms  Please read the additional information packets attached to your discharge summary.  Do not take your medicine if  develop an itchy rash, swelling in your mouth or lips, or difficulty breathing.

## 2018-02-25 NOTE — ED Provider Notes (Addendum)
[facility] EMERGENCY DEPARTMENT Provider Note   CSN: [id number] Arrival date & time: 02/25/18  1741    History   Chief Complaint Chief Complaint  Patient presents with  . Sore Throat  . Otalgia    HPI Pamela Schaefer is a 28 y.o. female presenting today for 1 day history of sore throat and ear pain.  Patient reports that her daughter recently tested positive for strep throat and is concerned that she has the same.  Patient endorses mild bilateral burning sensation that is constant and worsened with swallowing.  Patient has been using ibuprofen with some relief of symptoms.   Additionally patient endorses mild nonproductive cough that began yesterday.  Patient without chest pain or shortness of breath or hemoptysis.  She denies sputum production.   She denies unilateral pain, drooling, change to voice, difficulty swallowing/breathing, nausea/vomiting, fever or any other concerns today.    HPI  Past Medical History:  Diagnosis Date  . No pertinent past medical history   . PONV (postoperative nausea and vomiting)   . Pregnancy induced hypertension     Patient Active Problem List   Diagnosis Date Noted  . Acute appendicitis 10/25/2014  . Anxiety 11/15/2013  . GERD without esophagitis 07/31/2012    Past Surgical History:  Procedure Laterality Date  . CLAVICLE SURGERY    . INDUCED ABORTION    . LAPAROSCOPIC APPENDECTOMY N/A 10/25/2014   Procedure: APPENDECTOMY LAPAROSCOPIC;  Surgeon: Gaynelle Adu, MD;  Location: [facility] OR;  Service: General;  Laterality: N/A;     OB History    Gravida  7   Para  4   Term  4   Preterm  0   AB  2   Living  4     SAB  1   TAB  1   Ectopic  0   Multiple  0   Live Births  4            Home Medications    Prior to Admission medications   Medication Sig Start Date End Date Taking? Authorizing Provider  azithromycin (ZITHROMAX) 500 MG tablet Take 1 tablet (500 mg total) by mouth daily for 4 days.  Take first 2 tablets together, then 1 every day until finished. 02/26/18 03/02/18  Harlene Salts A, PA-C  lidocaine (XYLOCAINE) 2 % solution Use as directed 15 mLs in the mouth or throat as needed for mouth pain (Do NOT swallow). 02/25/18   Bill Salinas, PA-C  omeprazole (PRILOSEC) 40 MG capsule Take 1 capsule (40 mg total) by mouth daily. 04/20/17   Pincus Large, DO    Family History Family History  Problem Relation Age of Onset  . Heart disease Maternal Grandfather   . Kidney disease Maternal Grandfather        failure  . Heart disease Father   . Diabetes Maternal Grandmother   . Hypertension Maternal Grandmother   . Colon cancer Neg Hx   . Stomach cancer Neg Hx   . Rectal cancer Neg Hx   . Esophageal cancer Neg Hx   . Liver cancer Neg Hx     Social History Social History   Tobacco Use  . Smoking status: Former Smoker    Packs/day: 0.25    Types: Cigarettes  . Smokeless tobacco: Former Engineer, water Use Topics  . Alcohol use: Yes    Alcohol/week: 0.0 standard drinks    Comment: social  . Drug use: No     Allergies  Penicillins   Review of Systems Review of Systems  Constitutional: Negative.  Negative for chills and fever.  HENT: Positive for sore throat. Negative for drooling, facial swelling, trouble swallowing and voice change.   Respiratory: Positive for cough. Negative for shortness of breath.   Cardiovascular: Negative.  Negative for chest pain.  Gastrointestinal: Negative.  Negative for abdominal pain, nausea and vomiting.  Musculoskeletal: Negative.  Negative for neck pain and neck stiffness.   Physical Exam Updated Vital Signs BP 122/74 (BP Location: Right Arm)   Pulse 86   Temp 98.6 F (37 C) (Oral)   Resp 18   LMP 02/04/2018   SpO2 100%   Breastfeeding Unknown   Physical Exam Constitutional:      General: She is not in acute distress.    Appearance: Normal appearance. She is well-developed. She is not ill-appearing or diaphoretic.   HENT:     Head: Normocephalic and atraumatic.     Jaw: There is normal jaw occlusion. No trismus.     Right Ear: Tympanic membrane, ear canal and external ear normal.     Left Ear: Tympanic membrane, ear canal and external ear normal.     Nose: Rhinorrhea present. Rhinorrhea is clear.     Mouth/Throat:     Lips: Pink.     Mouth: Mucous membranes are moist.     Pharynx: Uvula midline.     Comments: The patient has normal phonation and is in control of secretions. No stridor.  Midline uvula without edema. Soft palate rises symmetrically. Mild tonsillar erythema without swelling or exudates. Tongue protrusion is normal, floor of mouth is soft. No trismus. No creptius on neck palpation. No gingival erythema or fluctuance noted. Mucus membranes moist. Eyes:     General: Vision grossly intact. Gaze aligned appropriately.     Extraocular Movements: Extraocular movements intact.     Conjunctiva/sclera: Conjunctivae normal.     Pupils: Pupils are equal, round, and reactive to light.     Comments: No pain with extraocular motion  Neck:     Musculoskeletal: Full passive range of motion without pain, normal range of motion and neck supple. No neck rigidity or crepitus.     Trachea: Trachea and phonation normal. No tracheal tenderness or tracheal deviation.  Cardiovascular:     Rate and Rhythm: Normal rate and regular rhythm.     Heart sounds: Normal heart sounds.  Pulmonary:     Effort: Pulmonary effort is normal. No respiratory distress.     Breath sounds: Normal breath sounds and air entry.  Chest:     Chest wall: No tenderness.  Abdominal:     General: There is no distension.     Palpations: Abdomen is soft.     Tenderness: There is no abdominal tenderness. There is no guarding or rebound.  Musculoskeletal: Normal range of motion.     Right lower leg: Normal. She exhibits no tenderness. No edema.     Left lower leg: Normal. She exhibits no tenderness. No edema.  Skin:    General: Skin is  warm and dry.  Neurological:     Mental Status: She is alert.     GCS: GCS eye subscore is 4. GCS verbal subscore is 5. GCS motor subscore is 6.     Comments: Speech is clear and goal oriented, follows commands Major Cranial nerves without deficit, no facial droop Moves extremities without ataxia, coordination intact Normal gait  Psychiatric:        Behavior: Behavior normal.  ED Treatments / Results  Labs (all labs ordered are listed, but only abnormal results are displayed) Labs Reviewed  GROUP A STREP BY PCR - Abnormal; Notable for the following components:      Result Value   Group A Strep by PCR DETECTED (*)    All other components within normal limits  INFLUENZA PANEL BY PCR (TYPE A & B)  POC URINE PREG, ED    EKG None  Radiology Dg Chest 2 View  Result Date: 02/25/2018 CLINICAL DATA:  Cough EXAM: CHEST - 2 VIEW COMPARISON:  07/25/2011 FINDINGS: Heart and mediastinal contours are within normal limits. No focal opacities or effusions. No acute bony abnormality. IMPRESSION: No active cardiopulmonary disease. Electronically Signed   By: Charlett Nose M.D.   On: 02/25/2018 21:02    Procedures Procedures (including critical care time)  Medications Ordered in ED Medications  dexamethasone (DECADRON) 1 MG/ML solution 10 mg (has no administration in time range)  azithromycin (ZITHROMAX) tablet 500 mg (500 mg Oral Given 02/25/18 2155)     Initial Impression / Assessment and Plan / ED Course  I have reviewed the triage vital signs and the nursing notes.  Pertinent labs & imaging results that were available during my care of the patient were reviewed by me and considered in my medical decision making (see chart for details).    Pamela Schaefer is a 28 y.o. female who presents today with a Sore Throat for 1 day.  Patient with mild tonsillar erythema on examination. Airway is patient, patient reports normal phonation and clinically patient does not sound muffled. Tonsils  appear symmetric, without exudate and their is no uvula deviation. No trismus on examination. Floor of the mouth is soft/nontender and their is no abnormal tongue protrusion. Patient is tolerating their own secretions without drooling and patient is able to drink here in the emergency department. Examination of the neck reveals mild anterior and no posterior cervical lymphadenopathy. Abdominal exam reveals a soft/nontender abdomen without palpable LUQ mass or tenderness.  Strep test positive Chest x-ray negative Influenza panel negative Pregnancy test negative  Patient with allergy to penicillin, will treat with azithromycin.  Decadron given for comfort.  No signs of Peritonsillar Abscess, Ludwig's Angina, Retropharyngeal Abscess, Dental Abscess or other deep tissue infections of the head or neck. Additionally do not suspect mononucleosis, gonococcal pharyngitis, foreign body or epiglottitis at this time.  Viscous lidocaine rinse prescribed for comfort.   Patient does not appear dehydrated, but did discuss importance of water rehydration. Patient counseled on conservative home remedies for sore throat. Ears clear bilaterally, no signs of otitis media or mastoiditis.  Suspect referred pain due to strep throat.  At this time there does not appear to be any evidence of an acute emergency medical condition and the patient appears stable for discharge with appropriate outpatient follow up. Diagnosis was discussed with patient who verbalizes understanding of care plan and is agreeable to discharge. I have discussed return precautions with patient who verbalizes understanding of return precautions. Patient strongly encouraged to follow-up with their PCP. All questions answered.  Note: Portions of this report may have been transcribed using voice recognition software. Every effort was made to ensure accuracy; however, inadvertent computerized transcription errors may still be present. Final Clinical  Impressions(s) / ED Diagnoses   Final diagnoses:  Strep pharyngitis    ED Discharge Orders         Ordered    azithromycin (ZITHROMAX) 500 MG tablet  Daily  02/25/18 2230    lidocaine (XYLOCAINE) 2 % solution  As needed     02/25/18 2230           Elizabeth Palau 02/25/18 2233    Elizabeth Palau 02/25/18 2237    Shaune Pollack, MD 02/28/18 [phone]

## 2018-02-27 ENCOUNTER — Telehealth: Payer: Self-pay | Admitting: *Deleted

## 2018-02-27 NOTE — Telephone Encounter (Signed)
Pharmacy called related to Rx: azithromycin (ZITHROMAX) 500 MG tablet .Marland KitchenMarland KitchenEDCM clarified with EDP Ranae Palms) to change Rx to: standing Z-Pac orders.

## 2018-12-11 ENCOUNTER — Other Ambulatory Visit: Payer: Self-pay

## 2018-12-11 ENCOUNTER — Encounter (HOSPITAL_COMMUNITY): Payer: Self-pay

## 2018-12-11 ENCOUNTER — Ambulatory Visit (HOSPITAL_COMMUNITY)
Admission: EM | Admit: 2018-12-11 | Discharge: 2018-12-11 | Disposition: A | Discharge status: Home / Self Care | Payer: Medicaid Other | Attending: Emergency Medicine | Admitting: Emergency Medicine

## 2018-12-11 DIAGNOSIS — L309 Dermatitis, unspecified: Secondary | ICD-10-CM

## 2018-12-11 MED ORDER — MUPIROCIN 2 % EX OINT: 1.0000 "application " | TOPICAL_OINTMENT | Freq: Three times a day (TID) | CUTANEOUS | 0 refills | Status: DC

## 2018-12-11 MED ORDER — HYDROCORTISONE BUTYRATE 0.1 % EX CREA: 1.0000 "application " | TOPICAL_CREAM | Freq: Two times a day (BID) | CUTANEOUS | 0 refills | Status: DC

## 2018-12-11 MED ORDER — PREDNISONE 10 MG (21) PO TBPK: ORAL_TABLET | ORAL | 0 refills | Status: DC

## 2018-12-11 NOTE — ED Provider Notes (Signed)
HPI  SUBJECTIVE:  Pamela Schaefer is a 28 y.o. female who presents with erythematous, dry, irritated skin underneath both of her eyes, bilateral forearms, and neck for 2 weeks.  She states that it is itchy, burning.  States that it itches all day long.  No fevers, body aches, new lotions, soaps, detergents, exposure to poison ivy, poison oak.  No eye creams.  States that she washes her face with Dove sensitive soap, but lately has been using hot water only.  She does not take any medications on a regular basis.  No recent antibiotics.  She denies blisters.  She has tried over-the-counter cortisone, [place], Dermabond Vaseline without improvement in her symptoms.  Symptoms are worse when she gets the rash wet.  Has a past medical history of contact allergic dermatitis.  No history of eczema, diabetes.  LMP: 11/21.  Denies possibility being pregnant.  PMD: Health department.    Past Medical History:  Diagnosis Date  . No pertinent past medical history   . PONV (postoperative nausea and vomiting)   . Pregnancy induced hypertension     Past Surgical History:  Procedure Laterality Date  . CLAVICLE SURGERY    . INDUCED ABORTION    . LAPAROSCOPIC APPENDECTOMY N/A 10/25/2014   Procedure: APPENDECTOMY LAPAROSCOPIC;  Surgeon: Gaynelle Adu, MD;  Location: [facility] OR;  Service: General;  Laterality: N/A;    Family History  Problem Relation Age of Onset  . Heart disease Maternal Grandfather   . Kidney disease Maternal Grandfather        failure  . Heart disease Father   . Diabetes Maternal Grandmother   . Hypertension Maternal Grandmother   . Colon cancer Neg Hx   . Stomach cancer Neg Hx   . Rectal cancer Neg Hx   . Esophageal cancer Neg Hx   . Liver cancer Neg Hx     Social History   Tobacco Use  . Smoking status: Former Smoker    Packs/day: 0.25    Types: Cigarettes  . Smokeless tobacco: Former Engineer, water Use Topics  . Alcohol use: Yes    Alcohol/week: 0.0 standard drinks   Comment: social  . Drug use: No    No current facility-administered medications for this encounter.   Current Outpatient Medications:  .  hydrocortisone butyrate (LUCOID) 0.1 % CREA cream, Apply 1 application topically 2 (two) times daily., Disp: 45 g, Rfl: 0 .  mupirocin ointment (BACTROBAN) 2 %, Apply 1 application topically 3 (three) times daily., Disp: 22 g, Rfl: 0 .  omeprazole (PRILOSEC) 40 MG capsule, Take 1 capsule (40 mg total) by mouth daily., Disp: 30 capsule, Rfl: 1 .  predniSONE (STERAPRED UNI-PAK 21 TAB) 10 MG (21) TBPK tablet, Dispense one 6 day pack. Take as directed with food., Disp: 21 tablet, Rfl: 0  Allergies  Allergen Reactions  . Penicillins Itching     ROS  As noted in HPI.   Physical Exam  BP 123/86 (BP Location: Right Arm)   Pulse 74   Temp 98.3 F (36.8 C) (Oral)   Resp 18   LMP 11/24/2018   SpO2 100%   Constitutional: Well developed, well nourished, no acute distress Eyes:  EOMI, conjunctiva normal bilaterally HENT: Normocephalic, atraumatic,mucus membranes moist Respiratory: Normal inspiratory effort Cardiovascular: Normal rate GI: nondistended skin: Tender scaly, dry erythematous patches underneath both eyes, similar lesion on right forearm with excoriations.  Nontender lesion on left forearm. no honey colored crust or blisters.  Nontender symmetric lesion on neck.  Musculoskeletal: no deformities Neurologic: Alert & oriented x 3, no focal neuro deficits Psychiatric: Speech and behavior appropriate   ED Course   Medications - No data to display  No orders of the defined types were placed in this encounter.   No results found for this or any previous visit (from the past 24 hour(s)). No results found.  ED Clinical Impression  1. Dermatitis      ED Assessment/Plan  Appears to be a irritant contact dermatitis versus eczema.  Will send home with Bactroban for the lesion on her forearm, hydrate cortisone butyrate  for the face, prednisone taper, advised lukewarm baths, discontinue using soap, advised using nonsoap facial cleanser, moisturizing after cleaning face with Cerave or Eucerin cream.  Follow-up with health department or may return here if not better in a week..  Discussed MDM, treatment plan, and plan for follow-up with patient. patient agrees with plan.   Meds ordered this encounter  Medications  . hydrocortisone butyrate (LUCOID) 0.1 % CREA cream    Sig: Apply 1 application topically 2 (two) times daily.    Dispense:  45 g    Refill:  0  . predniSONE (STERAPRED UNI-PAK 21 TAB) 10 MG (21) TBPK tablet    Sig: Dispense one 6 day pack. Take as directed with food.    Dispense:  21 tablet    Refill:  0  . mupirocin ointment (BACTROBAN) 2 %    Sig: Apply 1 application topically 3 (three) times daily.    Dispense:  22 g    Refill:  0    *This clinic note was created using [employer]. Therefore, there may be occasional mistakes despite careful proofreading.   ?   Melynda Ripple, MD 12/12/18 [phone]

## 2018-12-11 NOTE — Discharge Instructions (Addendum)
Bactroban is an antibacterial ointment.  I would apply this to the the lesion on your forearm, hydrocortisone butyrate for under your eyes, and forearm and neck, advised lukewarm baths, discontinue using soap, advised using nonsoap facial cleanser such as Burts bees sensitive skin cleanser, moisturizing after cleaning face with Cerave or Eucerin cream.

## 2018-12-11 NOTE — ED Triage Notes (Signed)
Pt presents with rash/skin irritation on face and on both arms X 2 weeks.

## 2019-01-04 NOTE — L&D Delivery Note (Addendum)
OB/GYN Faculty Practice Delivery Note  Pamela Schaefer is a 29 y.o. [id number] s/p vaginal delivery in setting of IOL secondary to non-reassuring NST at [redacted]w[redacted]d.   ROM: 8h 9m with yellow/moderate meconium fluid GBS Status:  Negative/-- (09/02 1605) Maximum Maternal Temperature: 99 F  Labor Progress: Patient presented to L&D for non-reassuring NST found in office and admitted for IOL. Initial SVE: 1.5/50/-3. Augmented with cytotec and pitocin. Labor course was uncomplicated. She then progressed to complete.   Delivery Date/Time: 10/02/19 @ 2209 Delivery: Called to room and patient was complete and pushing. Head position was ROA and delivered with ease over the perineum. No nuchal cord present. Shoulder and body delivered in usual fashion. Infant with spontaneous cry, placed on mother's abdomen, dried and stimulated. Cord clamped x 2 after 1-minute delay, and cut by FOB. Cord blood drawn. Placenta delivered spontaneously with gentle cord traction. Fundus firm with massage and pitocin started. Labia, perineum, vagina, and cervix inspected and intact.  Baby Weight: per chart  Cord: central insertion, 3 vessel Placenta: Sent to L&D, intact Complications: None Lacerations: none EBL: 300 cc Analgesia: None  Infant: APGAR (1 MIN): 9   APGAR (5 MINS): 9   Herby Abraham MD, PGY-1 OBGYN Faculty Teaching Service  10/02/2019, 10:26 PM  I was gloved and present for delivery of infant and placenta. I agree with resident's note above.  Sheila Oats, MD OB Fellow, Faculty Practice 10/02/2019 10:43 PM

## 2019-03-31 ENCOUNTER — Encounter (HOSPITAL_COMMUNITY): Payer: Self-pay | Admitting: Obstetrics & Gynecology

## 2019-03-31 ENCOUNTER — Inpatient Hospital Stay (HOSPITAL_COMMUNITY)
Admission: AD | Admit: 2019-03-31 | Discharge: 2019-03-31 | Disposition: A | Discharge status: Home / Self Care | Payer: Medicaid Other | Attending: Obstetrics & Gynecology | Admitting: Obstetrics & Gynecology

## 2019-03-31 ENCOUNTER — Inpatient Hospital Stay (HOSPITAL_COMMUNITY): Payer: Medicaid Other

## 2019-03-31 ENCOUNTER — Other Ambulatory Visit: Payer: Self-pay

## 2019-03-31 DIAGNOSIS — Z880 Allergy status to penicillin: Secondary | ICD-10-CM | POA: Diagnosis not present

## 2019-03-31 DIAGNOSIS — O26892 Other specified pregnancy related conditions, second trimester: Secondary | ICD-10-CM | POA: Insufficient documentation

## 2019-03-31 DIAGNOSIS — R109 Unspecified abdominal pain: Secondary | ICD-10-CM | POA: Insufficient documentation

## 2019-03-31 DIAGNOSIS — Z87891 Personal history of nicotine dependence: Secondary | ICD-10-CM | POA: Insufficient documentation

## 2019-03-31 DIAGNOSIS — Z3A14 14 weeks gestation of pregnancy: Secondary | ICD-10-CM | POA: Insufficient documentation

## 2019-03-31 DIAGNOSIS — Z3A13 13 weeks gestation of pregnancy: Secondary | ICD-10-CM

## 2019-03-31 DIAGNOSIS — R102 Pelvic and perineal pain: Secondary | ICD-10-CM

## 2019-03-31 DIAGNOSIS — Z3490 Encounter for supervision of normal pregnancy, unspecified, unspecified trimester: Secondary | ICD-10-CM

## 2019-03-31 LAB — WET PREP, GENITAL
Clue Cells Wet Prep HPF POC: NONE SEEN
Sperm: NONE SEEN
Trich, Wet Prep: NONE SEEN
Yeast Wet Prep HPF POC: NONE SEEN

## 2019-03-31 LAB — URINALYSIS, ROUTINE W REFLEX MICROSCOPIC
Bacteria, UA: NONE SEEN
Bilirubin Urine: NEGATIVE
Glucose, UA: NEGATIVE mg/dL
Hgb urine dipstick: NEGATIVE
Ketones, ur: NEGATIVE mg/dL
Leukocytes,Ua: NEGATIVE
Nitrite: NEGATIVE
Protein, ur: 30 mg/dL — AB
Specific Gravity, Urine: 1.019 (ref 1.005–1.030)
pH: 9 — ABNORMAL HIGH (ref 5.0–8.0)

## 2019-03-31 LAB — POCT PREGNANCY, URINE: Preg Test, Ur: POSITIVE — AB

## 2019-03-31 MED ORDER — ACETAMINOPHEN 500 MG PO TABS
1000.0000 mg | ORAL_TABLET | Freq: Once | ORAL | Status: AC
  Administered 2019-03-31: 1000 mg via ORAL
  Filled 2019-03-31: qty 2

## 2019-03-31 NOTE — MAU Provider Note (Signed)
History    CSN: [id number]  Arrival date and time: 03/31/19 1353  First Provider Initiated Contact with Patient 03/31/19 1433     Chief Complaint  Patient presents with  . Abdominal Pain   HPI Pamela Schaefer is a 29 y.o. [id number] at [redacted]w[redacted]d by certain LMP who presents to MAU with chief complaint of groin pain and suprapubic cramping. These are recurrent complaints, onset two weeks ago. She rates her discomfort as 5/10. She denies aggravating or alleviating factors. She has attempted treatment with intermittent Tylenol, most recently taken yesterday. This did not provide relief.    Patient states she has only had a slim jim beef jerky today. She endorses drinking about 3 cups of water each day. She denies dysuria, vaginal bleeding, abdominal tenderness, fever or recent illness.  She is remote from intercourse.  Patient has not initiated prenatal care and has not had imaging.  OB History    Gravida  8   Para  4   Term  4   Preterm  0   AB  2   Living  4     SAB  1   TAB  1   Ectopic  0   Multiple  0   Live Births  4           Past Medical History:  Diagnosis Date  . No pertinent past medical history   . PONV (postoperative nausea and vomiting)   . Pregnancy induced hypertension     Past Surgical History:  Procedure Laterality Date  . CLAVICLE SURGERY    . INDUCED ABORTION    . LAPAROSCOPIC APPENDECTOMY N/A 10/25/2014   Procedure: APPENDECTOMY LAPAROSCOPIC;  Surgeon: Greer Pickerel, MD;  Location: [facility] OR;  Service: General;  Laterality: N/A;    Family History  Problem Relation Age of Onset  . Heart disease Maternal Grandfather   . Kidney disease Maternal Grandfather        failure  . Heart disease Father   . Diabetes Maternal Grandmother   . Hypertension Maternal Grandmother   . Colon cancer Neg Hx   . Stomach cancer Neg Hx   . Rectal cancer Neg Hx   . Esophageal cancer Neg Hx   . Liver cancer Neg Hx     Social History   Tobacco Use  . Smoking  status: Former Smoker    Packs/day: 0.25    Types: Cigarettes  . Smokeless tobacco: Former Network engineer Use Topics  . Alcohol use: Yes    Alcohol/week: 0.0 standard drinks    Comment: social  . Drug use: No    Allergies:  Allergies  Allergen Reactions  . Penicillins Itching    Medications Prior to Admission  Medication Sig Dispense Refill Last Dose  . hydrocortisone butyrate (LUCOID) 0.1 % CREA cream Apply 1 application topically 2 (two) times daily. 45 g 0   . mupirocin ointment (BACTROBAN) 2 % Apply 1 application topically 3 (three) times daily. 22 g 0   . omeprazole (PRILOSEC) 40 MG capsule Take 1 capsule (40 mg total) by mouth daily. 30 capsule 1   . predniSONE (STERAPRED UNI-PAK 21 TAB) 10 MG (21) TBPK tablet Dispense one 6 day pack. Take as directed with food. 21 tablet 0     Review of Systems  Constitutional: Negative for fever.  Gastrointestinal: Positive for abdominal pain. Negative for nausea and vomiting.  Genitourinary: Negative for dysuria, vaginal bleeding, vaginal discharge and vaginal pain.  Musculoskeletal: Negative for back pain.  All other systems reviewed and are negative.  Physical Exam   Blood pressure 112/69, pulse 80, temperature 98.9 F (37.2 C), temperature source Oral, resp. rate 16, height 5\' 3"  (1.6 m), weight 89.1 kg, last menstrual period 12/25/2018, SpO2 100 %, unknown if currently breastfeeding.  Physical Exam  Nursing note and vitals reviewed. Constitutional: She is oriented to person, place, and time. She appears well-developed and well-nourished.  Cardiovascular: Normal rate, normal heart sounds and intact distal pulses.  Respiratory: Effort normal and breath sounds normal.  GI: Soft. Bowel sounds are normal. She exhibits no distension. There is no abdominal tenderness. There is no rebound and no guarding.  Genitourinary:    No vaginal discharge.   Neurological: She is alert and oriented to person, place, and time.  Skin: Skin is  warm and dry.  Psychiatric: She has a normal mood and affect. Her behavior is normal. Judgment and thought content normal.    MAU Course/MDM  Procedures   --Vaginal swabs collected via blind swab. CNM in room during collection. No abnormal discharge noted on swabs --Patient measuring 14+ weeks on ultrasound, see scanned MFM worksheet. No concerning findings --Given list of prenatal care providers in [place], safe medications in pregnancy  Patient Vitals for the past 24 hrs:  BP Temp Temp src Pulse Resp SpO2 Height Weight  03/31/19 1544 108/74 - - 86 - - - -  03/31/19 1412 112/69 98.9 F (37.2 C) Oral 80 16 100 % - -  03/31/19 1403 - - - - - - 5\' 3"  (1.6 m) 89.1 kg   Results for orders placed or performed during the hospital encounter of 03/31/19 (from the past 24 hour(s))  Urinalysis, Routine w reflex microscopic     Status: Abnormal   Collection Time: 03/31/19  2:19 PM  Result Value Ref Range   Color, Urine YELLOW YELLOW   APPearance HAZY (A) CLEAR   Specific Gravity, Urine 1.019 1.005 - 1.030   pH 9.0 (H) 5.0 - 8.0   Glucose, UA NEGATIVE NEGATIVE mg/dL   Hgb urine dipstick NEGATIVE NEGATIVE   Bilirubin Urine NEGATIVE NEGATIVE   Ketones, ur NEGATIVE NEGATIVE mg/dL   Protein, ur 30 (A) NEGATIVE mg/dL   Nitrite NEGATIVE NEGATIVE   Leukocytes,Ua NEGATIVE NEGATIVE   RBC / HPF 0-5 0 - 5 RBC/hpf   WBC, UA 0-5 0 - 5 WBC/hpf   Bacteria, UA NONE SEEN NONE SEEN   Squamous Epithelial / LPF 6-10 0 - 5   Mucus PRESENT   Pregnancy, urine POC     Status: Abnormal   Collection Time: 03/31/19  2:20 PM  Result Value Ref Range   Preg Test, Ur POSITIVE (A) NEGATIVE  Wet prep, genital     Status: Abnormal   Collection Time: 03/31/19  2:40 PM  Result Value Ref Range   Yeast Wet Prep HPF POC NONE SEEN NONE SEEN   Trich, Wet Prep NONE SEEN NONE SEEN   Clue Cells Wet Prep HPF POC NONE SEEN NONE SEEN   WBC, Wet Prep HPF POC FEW (A) NONE SEEN   Sperm NONE SEEN    Assessment and Plan   --29 y.o. 04/02/19 with [facility] at [redacted]w[redacted]d per ultrasound --No concerning findings --Discharge home in stable condition  F/U: --Patient to choose prenatal care provider, establish care  [id number], CNM 03/31/2019, 4:35 PM

## 2019-03-31 NOTE — Discharge Instructions (Signed)
Eating Plan for Pregnant Women While you are pregnant, your body requires additional nutrition to help support your growing baby. You also have a higher need for some vitamins and minerals, such as folic acid, calcium, iron, and vitamin D. Eating a healthy, well-balanced diet is very important for your health and your baby's health. Your need for extra calories varies for the three 55-month segments of your pregnancy (trimesters). For most women, it is recommended to consume:  150 extra calories a day during the first trimester.  300 extra calories a day during the second trimester.  300 extra calories a day during the third trimester. What are tips for following this plan?   Do not try to lose weight or go on a diet during pregnancy.  Limit your overall intake of foods that have "empty calories." These are foods that have little nutritional value, such as sweets, desserts, candies, and sugar-sweetened beverages.  Eat a variety of foods (especially fruits and vegetables) to get a full range of vitamins and minerals.  Take a prenatal vitamin to help meet your additional vitamin and mineral needs during pregnancy, specifically for folic acid, iron, calcium, and vitamin D.  Remember to stay active. Ask your health care provider what types of exercise and activities are safe for you.  Practice good food safety and cleanliness. Wash your hands before you eat and after you prepare raw meat. Wash all fruits and vegetables well before peeling or eating. Taking these actions can help to prevent food-borne illnesses that can be very dangerous to your baby, such as listeriosis. Ask your health care provider for more information about listeriosis. What does 150 extra calories look like? Healthy options that provide 150 extra calories each day could be any of the following:  6-8 oz (170-230 g) of plain low-fat yogurt with  cup of berries.  1 apple with 2 teaspoons (11 g) of peanut butter.  Cut-up  vegetables with  cup (60 g) of hummus.  8 oz (230 mL) or 1 cup of low-fat chocolate milk.  1 stick of string cheese with 1 medium orange.  1 peanut butter and jelly sandwich that is made with one slice of whole-wheat bread and 1 tsp (5 g) of peanut butter. For 300 extra calories, you could eat two of those healthy options each day. What is a healthy amount of weight to gain? The right amount of weight gain for you is based on your BMI before you became pregnant. If your BMI:  Was less than 18 (underweight), you should gain 28-40 lb (13-18 kg).  Was 18-24.9 (normal), you should gain 25-35 lb (11-16 kg).  Was 25-29.9 (overweight), you should gain 15-25 lb (7-11 kg).  Was 30 or greater (obese), you should gain 11-20 lb (5-9 kg). What if I am having twins or multiples? Generally, if you are carrying twins or multiples:  You may need to eat 300-600 extra calories a day.  The recommended range for total weight gain is 25-54 lb (11-25 kg), depending on your BMI before pregnancy.  Talk with your health care provider to find out about nutritional needs, weight gain, and exercise that is right for you. What foods can I eat?  Fruits All fruits. Eat a variety of colors and types of fruit. Remember to wash your fruits well before peeling or eating. Vegetables All vegetables. Eat a variety of colors and types of vegetables. Remember to wash your vegetables well before peeling or eating. Grains All grains. Choose whole grains, such  as whole-wheat bread, oatmeal, or brown rice. Meats and other protein foods Lean meats, including chicken, [place], fish, and lean cuts of beef, veal, or pork. If you eat fish or seafood, choose options that are higher in omega-3 fatty acids and lower in mercury, such as salmon, herring, mussels, trout, sardines, pollock, shrimp, crab, and lobster. Tofu. Tempeh. Beans. Eggs. Peanut butter and other nut butters. Make sure that all meats, poultry, and eggs are cooked to  food-safe temperatures or "well-done." Two or more servings of fish are recommended each week in order to get the most benefits from omega-3 fatty acids that are found in seafood. Choose fish that are lower in mercury. You can find more information online:  GuamGaming.ch Dairy Pasteurized milk and milk alternatives (such as almond milk). Pasteurized yogurt and pasteurized cheese. Cottage cheese. Sour cream. Beverages Water. Juices that contain 100% fruit juice or vegetable juice. Caffeine-free teas and decaffeinated coffee. Drinks that contain caffeine are okay to drink, but it is better to avoid caffeine. Keep your total caffeine intake to less than 200 mg each day (which is 12 oz or 355 mL of coffee, tea, or soda) or the limit as told by your health care provider. Fats and oils Fats and oils are okay to include in moderation. Sweets and desserts Sweets and desserts are okay to include in moderation. Seasoning and other foods All pasteurized condiments. The items listed above may not be a complete list of foods and beverages you can eat. Contact a dietitian for more information. What foods are not recommended? Fruits Unpasteurized fruit juices. Vegetables Raw (unpasteurized) vegetable juices. Meats and other protein foods Lunch meats, bologna, hot dogs, or other deli meats. (If you must eat those meats, reheat them until they are steaming hot.) Refrigerated pat, meat spreads from a meat counter, smoked seafood that is found in the refrigerated section of a store. Raw or undercooked meats, poultry, and eggs. Raw fish, such as sushi or sashimi. Fish that have high mercury content, such as tilefish, shark, swordfish, and king mackerel. To learn more about mercury in fish, talk with your health care provider or look for online resources, such as:  GuamGaming.ch Dairy Raw (unpasteurized) milk and any foods that have raw milk in them. Soft cheeses, such as feta, queso blanco, queso fresco, Brie,  Camembert cheeses, blue-veined cheeses, and Panela cheese (unless it is made with pasteurized milk, which must be stated on the label). Beverages Alcohol. Sugar-sweetened beverages, such as sodas, teas, or energy drinks. Seasoning and other foods Homemade fermented foods and drinks, such as pickles, sauerkraut, or kombucha drinks. (Store-bought pasteurized versions of these are okay.) Salads that are made in a store or deli, such as ham salad, chicken salad, egg salad, tuna salad, and seafood salad. The items listed above may not be a complete list of foods and beverages you should avoid. Contact a dietitian for more information. Where to find more information To calculate the number of calories you need based on your height, weight, and activity level, you can use an online calculator such as:  MobileTransition.ch To calculate how much weight you should gain during pregnancy, you can use an online pregnancy weight gain calculator such as:  StreamingFood.com.cy Summary  While you are pregnant, your body requires additional nutrition to help support your growing baby.  Eat a variety of foods, especially fruits and vegetables to get a full range of vitamins and minerals.  Practice good food safety and cleanliness. Wash your hands before you eat  and after you prepare raw meat. Wash all fruits and vegetables well before peeling or eating. Taking these actions can help to prevent food-borne illnesses, such as listeriosis, that can be very dangerous to your baby.  Do not eat raw meat or fish. Do not eat fish that have high mercury content, such as tilefish, shark, swordfish, and king mackerel. Do not eat unpasteurized (raw) dairy.  Take a prenatal vitamin to help meet your additional vitamin and mineral needs during pregnancy, specifically for folic acid, iron, calcium, and vitamin D. This information is not intended to replace advice given to you by  your health care provider. Make sure you discuss any questions you have with your health care provider. Document Revised: 05/10/2018 Document Reviewed: 09/16/2016 Elsevier Patient Education  2020 [employer].  Safe Medications in Pregnancy   Acne: Benzoyl Peroxide Salicylic Acid  Backache/Headache: Tylenol: 2 regular strength every 4 hours OR              2 Extra strength every 6 hours  Colds/Coughs/Allergies: Benadryl (alcohol free) 25 mg every 6 hours as needed Breath right strips Claritin Cepacol throat lozenges Chloraseptic throat spray Cold-Eeze- up to three times per day Cough drops, alcohol free Flonase (by prescription only) Guaifenesin Mucinex Robitussin DM (plain only, alcohol free) Saline nasal spray/drops Sudafed (pseudoephedrine) & Actifed ** use only after [redacted] weeks gestation and if you do not have high blood pressure Tylenol Vicks Vaporub Zinc lozenges Zyrtec   Constipation: Colace Ducolax suppositories Fleet enema Glycerin suppositories Metamucil Milk of magnesia Miralax Senokot Smooth move tea  Diarrhea: Kaopectate Imodium A-D  *NO pepto Bismol  Hemorrhoids: Anusol Anusol HC Preparation H Tucks  Indigestion: Tums Maalox Mylanta Zantac  Pepcid  Insomnia: Benadryl (alcohol free) 25mg  every 6 hours as needed Tylenol PM Unisom, no Gelcaps  Leg Cramps: Tums MagGel  Nausea/Vomiting:  Bonine Dramamine Emetrol Ginger extract Sea bands Meclizine  Nausea medication to take during pregnancy:  Unisom (doxylamine succinate 25 mg tablets) Take one tablet daily at bedtime. If symptoms are not adequately controlled, the dose can be increased to a maximum recommended dose of two tablets daily (1/2 tablet in the morning, 1/2 tablet mid-afternoon and one at bedtime). Vitamin B6 100mg  tablets. Take one tablet twice a day (up to 200 mg per day).  Skin Rashes: Aveeno products Benadryl cream or 25mg  every 6 hours as needed Calamine  Lotion 1% cortisone cream  Yeast infection: Gyne-lotrimin 7 Monistat 7   **If taking multiple medications, please check labels to avoid duplicating the same active ingredients **take medication as directed on the label ** Do not exceed 4000 mg of tylenol in 24 hours **Do not take medications that contain aspirin or ibuprofen  Prenatal Care Providers           [facility] for [facility] @ [facility]   Phone: [phone]  [facility] for [facility] @ [facility]   Phone: [phone]  [facility] @[facility]       Phone: [phone]            [facility] for [facility] Healthcare @ [place]     Phone: [phone]          [facility] for [id number] Healthcare @ [facility]   Phone: [phone]  Center for [facility] @ [facility]  Phone: [phone]  Center for [facility] Healthcare @ [facility] Phone: [phone]     [facility]  Phone: [phone]  [facility]  Phone: [phone]  [facility]  Phone: [phone]  [facility] Phone: [phone]  [facility] Phone: [phone]  [facility] Phone: [phone]  [facility]  Phone: [phone]

## 2019-03-31 NOTE — MAU Note (Signed)
Pamela Schaefer is a 29 y.o. here in MAU reporting: lower abdominal/pelvic pain and pressure for the past 2 weeks. + UPT in the end of January. Had some bleeding last week but none since then. No discharge.   LMP: 12/25/18  Onset of complaint: 2 weeks  Pain score: 5/10  Vitals:   03/31/19 1412  BP: 112/69  Pulse: 80  Resp: 16  Temp: 98.9 F (37.2 C)  SpO2: 100%     Lab orders placed from triage: UPT

## 2019-04-01 LAB — GC/CHLAMYDIA PROBE AMP (~~LOC~~) NOT AT ARMC
Chlamydia: NEGATIVE
Comment: NEGATIVE
Comment: NORMAL
Neisseria Gonorrhea: NEGATIVE

## 2019-04-08 ENCOUNTER — Encounter: Payer: Self-pay | Admitting: General Practice

## 2019-04-08 ENCOUNTER — Ambulatory Visit (INDEPENDENT_AMBULATORY_CARE_PROVIDER_SITE_OTHER): Payer: Medicaid Other | Admitting: *Deleted

## 2019-04-08 ENCOUNTER — Other Ambulatory Visit: Payer: Self-pay

## 2019-04-08 VITALS — BP 105/69 | HR 79 | Temp 97.9°F | Wt 197.0 lb

## 2019-04-08 DIAGNOSIS — Z3480 Encounter for supervision of other normal pregnancy, unspecified trimester: Secondary | ICD-10-CM | POA: Insufficient documentation

## 2019-04-08 MED ORDER — BLOOD PRESSURE MONITOR AUTOMAT DEVI: 1.0000 | Freq: Every day | 0 refills | Status: DC

## 2019-04-08 MED ORDER — GOJJI WEIGHT SCALE MISC: 1.0000 | Freq: Every day | 0 refills | Status: DC | PRN

## 2019-04-08 NOTE — Patient Instructions (Addendum)
 Genetic Screening Results Information: You are having genetic testing called [facility] today.  It will take approximately 2 weeks before the results are available.  To get your results, you need Internet access to a web browser to search [place]/MyChart (the direct app on your phone will not give you these results).  Then select Lab Scanned and click on the blue hyper link that says View Image to see your [facility] results.  You can also use the directions on the purple card given to look up your results directly on the [employer] website.   Second Trimester of Pregnancy  The second trimester is from week 14 through week 27 (month 4 through 6). This is often the time in pregnancy that you feel your best. Often times, morning sickness has lessened or quit. You may have more energy, and you may get hungry more often. Your unborn baby is growing rapidly. At the end of the sixth month, he or she is about 9 inches long and weighs about 1 pounds. You will likely feel the baby move between 18 and 20 weeks of pregnancy. Follow these instructions at home: Medicines  Take over-the-counter and prescription medicines only as told by your doctor. Some medicines are safe and some medicines are not safe during pregnancy.  Take a prenatal vitamin that contains at least 600 micrograms (mcg) of folic acid.  If you have trouble pooping (constipation), take medicine that will make your stool soft (stool softener) if your doctor approves. Eating and drinking   Eat regular, healthy meals.  Avoid raw meat and uncooked cheese.  If you get low calcium from the food you eat, talk to your doctor about taking a daily calcium supplement.  Avoid foods that are high in fat and sugars, such as fried and sweet foods.  If you feel sick to your stomach (nauseous) or throw up (vomit): ? Eat 4 or 5 small meals a day instead of 3 large meals. ? Try eating a few soda crackers. ? Drink liquids between meals instead of during  meals.  To prevent constipation: ? Eat foods that are high in fiber, like fresh fruits and vegetables, whole grains, and beans. ? Drink enough fluids to keep your pee (urine) clear or pale yellow. Activity  Exercise only as told by your doctor. Stop exercising if you start to have cramps.  Do not exercise if it is too hot, too humid, or if you are in a place of great height (high altitude).  Avoid heavy lifting.  Wear low-heeled shoes. Sit and stand up straight.  You can continue to have sex unless your doctor tells you not to. Relieving pain and discomfort  Wear a good support bra if your breasts are tender.  Take warm water baths (sitz baths) to soothe pain or discomfort caused by hemorrhoids. Use hemorrhoid cream if your doctor approves.  Rest with your legs raised if you have leg cramps or low back pain.  If you develop puffy, bulging veins (varicose veins) in your legs: ? Wear support hose or compression stockings as told by your doctor. ? Raise (elevate) your feet for 15 minutes, 3-4 times a day. ? Limit salt in your food. Prenatal care  Write down your questions. Take them to your prenatal visits.  Keep all your prenatal visits as told by your doctor. This is important. Safety  Wear your seat belt when driving.  Make a list of emergency phone numbers, including numbers for family, friends, the hospital, and police and   Garment/textile technologist. General instructions  Ask your doctor about the right foods to eat or for help finding a counselor, if you need these services.  Ask your doctor about local prenatal classes. Begin classes before month 6 of your pregnancy.  Do not use hot tubs, steam rooms, or saunas.  Do not douche or use tampons or scented sanitary pads.  Do not cross your legs for long periods of time.  Visit your dentist if you have not done so. Use a soft toothbrush to brush your teeth. Floss gently.  Avoid all smoking, herbs, and alcohol. Avoid drugs  that are not approved by your doctor.  Do not use any products that contain nicotine or tobacco, such as cigarettes and e-cigarettes. If you need help quitting, ask your doctor.  Avoid cat litter boxes and soil used by cats. These carry germs that can cause birth defects in the baby and can cause a loss of your baby (miscarriage) or stillbirth. Contact a doctor if:  You have mild cramps or pressure in your lower belly.  You have pain when you pee (urinate).  You have bad smelling fluid coming from your vagina.  You continue to feel sick to your stomach (nauseous), throw up (vomit), or have watery poop (diarrhea).  You have a nagging pain in your belly area.  You feel dizzy. Get help right away if:  You have a fever.  You are leaking fluid from your vagina.  You have spotting or bleeding from your vagina.  You have severe belly cramping or pain.  You lose or gain weight rapidly.  You have trouble catching your breath and have chest pain.  You notice sudden or extreme puffiness (swelling) of your face, hands, ankles, feet, or legs.  You have not felt the baby move in over an hour.  You have severe headaches that do not go away when you take medicine.  You have trouble seeing. Summary  The second trimester is from week 14 through week 27 (months 4 through 6). This is often the time in pregnancy that you feel your best.  To take care of yourself and your unborn baby, you will need to eat healthy meals, take medicines only if your doctor tells you to do so, and do activities that are safe for you and your baby.  Call your doctor if you get sick or if you notice anything unusual about your pregnancy. Also, call your doctor if you need help with the right food to eat, or if you want to know what activities are safe for you. This information is not intended to replace advice given to you by your health care provider. Make sure you discuss any questions you have with your health  care provider. Document Revised: 04/13/2018 Document Reviewed: 01/26/2016 Elsevier Patient Education  2020 [employer]  Round Ligament Pain  The round ligament is a cord of muscle and tissue that helps support the uterus. It can become a source of pain during pregnancy if it becomes stretched or twisted as the baby grows. The pain usually begins in the second trimester (13-28 weeks) of pregnancy, and it can come and go until the baby is delivered. It is not a serious problem, and it does not cause harm to the baby. Round ligament pain is usually a short, sharp, and pinching pain, but it can also be a dull, lingering, and aching pain. The pain is felt in the lower side of the abdomen or in the groin. It usually  starts deep in the groin and moves up to the outside of the hip area. The pain may occur when you:  Suddenly change position, such as quickly going from a sitting to standing position.  Roll over in bed.  Cough or sneeze.  Do physical activity. Follow these instructions at home:   Watch your condition for any changes.  When the pain starts, relax. Then try any of these methods to help with the pain: ? Sitting down. ? Flexing your knees up to your abdomen. ? Lying on your side with one pillow under your abdomen and another pillow between your legs. ? Sitting in a warm bath for 15-20 minutes or until the pain goes away.  Take over-the-counter and prescription medicines only as told by your health care provider.  Move slowly when you sit down or stand up.  Avoid long walks if they cause pain.  Stop or reduce your physical activities if they cause pain.  Keep all follow-up visits as told by your health care provider. This is important. Contact a health care provider if:  Your pain does not go away with treatment.  You feel pain in your back that you did not have before.  Your medicine is not helping. Get help right away if:  You have a fever or chills.  You develop  uterine contractions.  You have vaginal bleeding.  You have nausea or vomiting.  You have diarrhea.  You have pain when you urinate. Summary  Round ligament pain is felt in the lower abdomen or groin. It is usually a short, sharp, and pinching pain. It can also be a dull, lingering, and aching pain.  This pain usually begins in the second trimester (13-28 weeks). It occurs because the uterus is stretching with the growing baby, and it is not harmful to the baby.  You may notice the pain when you suddenly change position, when you cough or sneeze, or during physical activity.  Relaxing, flexing your knees to your abdomen, lying on one side, or taking a warm bath may help to get rid of the pain.  Get help from your health care provider if the pain does not go away or if you have vaginal bleeding, nausea, vomiting, diarrhea, or painful urination. This information is not intended to replace advice given to you by your health care provider. Make sure you discuss any questions you have with your health care provider. Document Revised: 06/07/2017 Document Reviewed: 06/07/2017 Elsevier Patient Education  2020 [employer].  Warning Signs During Pregnancy A pregnancy lasts about 40 weeks, starting from the first day of your last period until the baby is born. Pregnancy is divided into three phases called trimesters.  The first trimester refers to week 1 through week 13 of pregnancy.  The second trimester is the start of week 14 through the end of week 27.  The third trimester is the start of week 28 until you deliver your baby. During each trimester of pregnancy, certain signs and symptoms may indicate a problem. Talk with your health care provider about your current health and any medical conditions you have. Make sure you know the symptoms that you should watch for and report. How does this affect me?  Warning signs in the first trimester While some changes during the first trimester  may be uncomfortable, most do not represent a serious problem. Let your health care provider know if you have any of the following warning signs in the first trimester:  You cannot  eat or drink without vomiting, and this lasts for longer than a day.  You have vaginal bleeding or spotting along with menstrual-like cramping.  You have diarrhea for longer than a day.  You have a fever or other signs of infection, such as: ? Pain or burning when you urinate. ? Foul smelling or thick or yellowish vaginal discharge. Warning signs in the second trimester As your baby grows and changes during the second trimester, there are additional signs and symptoms that may indicate a problem. These include:  Signs and symptoms of infection, including a fever.  Signs or symptoms of a miscarriage or preterm labor, such as regular contractions, menstrual-like cramping, or lower abdominal pain.  Bloody or watery vaginal discharge or obvious vaginal bleeding.  Feeling like your heart is pounding.  Having trouble breathing.  Nausea, vomiting, or diarrhea that lasts for longer than a day.  Craving non-food items, such as clay, chalk, or dirt. This may be a sign of a very treatable medical condition called pica. Later in your second trimester, watch for signs and symptoms of a serious medical condition called preeclampsia.These include:  Changes in your vision.  A severe headache that does not go away.  Nausea and vomiting. It is also important to notice if your baby stops moving or moves less than usual during this time. Warning signs in the third trimester As you approach the third trimester, your baby is growing and your body is preparing for the birth of your baby. In your third trimester, be sure to let your health care provider know if:  You have signs and symptoms of infection, including a fever.  You have vaginal bleeding.  You notice that your baby is moving less than usual or is not moving.   You have nausea, vomiting, or diarrhea that lasts for longer than a day.  You have a severe headache that does not go away.  You have vision changes, including seeing spots or having blurry or double vision.  You have increased swelling in your hands or face. How does this affect my baby? Throughout your pregnancy, always report any of the warning signs of a problem to your health care provider. This can help prevent complications that may affect your baby, including:  Increased risk for premature birth.  Infection that may be transmitted to your baby.  Increased risk for stillbirth. Contact a health care provider if:  You have any of the warning signs of a problem for the current trimester of your pregnancy.  Any of the following apply to you during any trimester of pregnancy: ? You have strong emotions, such as sadness or anxiety, that interfere with work or personal relationships. ? You feel unsafe in your home and need help finding a safe place to live. ? You are using tobacco products, alcohol, or drugs and you need help to stop. Get help right away if: You have signs or symptoms of labor before 37 weeks of pregnancy. These include:  Contractions that are 5 minutes or less apart, or that increase in frequency, intensity, or length.  Sudden, sharp abdominal pain or low back pain.  Uncontrolled gush or trickle of fluid from your vagina. Summary  A pregnancy lasts about 40 weeks, starting from the first day of your last period until the baby is born. Pregnancy is divided into three phases called trimesters. Each trimester has warning signs to watch for.  Always report any warning signs to your health care provider in order to prevent  complications that may affect both you and your baby.  Talk with your health care provider about your current health and any medical conditions you have. Make sure you know the symptoms that you should watch for and report. This information is  not intended to replace advice given to you by your health care provider. Make sure you discuss any questions you have with your health care provider. Document Revised: 04/10/2018 Document Reviewed: 10/06/2016 Elsevier Patient Education  2020 [employer] Inc.  Abdominal Pain During Pregnancy  Belly (abdominal) pain is common during pregnancy. There are many possible causes. Most of the time, it is not a serious problem. Other times, it can be a sign that something is wrong with the pregnancy. Always tell your doctor if you have belly pain. Follow these instructions at home:  Do not have sex or put anything in your vagina until your pain goes away completely.  Get plenty of rest until your pain gets better.  Drink enough fluid to keep your pee (urine) pale yellow.  Take over-the-counter and prescription medicines only as told by your doctor.  Keep all follow-up visits as told by your doctor. This is important. Contact a doctor if:  Your pain continues or gets worse after resting.  You have lower belly pain that: ? Comes and goes at regular times. ? Spreads to your back. ? Feels like menstrual cramps.  You have pain or burning when you pee (urinate). Get help right away if:  You have a fever or chills.  You have vaginal bleeding.  You are leaking fluid from your vagina.  You are passing tissue from your vagina.  You throw up (vomit) for more than 24 hours.  You have watery poop (diarrhea) for more than 24 hours.  Your baby is moving less than usual.  You feel very weak or faint.  You have shortness of breath.  You have very bad pain in your upper belly. Summary  Belly (abdominal) pain is common during pregnancy. There are many possible causes.  If you have belly pain during pregnancy, tell your doctor right away.  Keep all follow-up visits as told by your doctor. This is important. This information is not intended to replace advice given to you by your health care  provider. Make sure you discuss any questions you have with your health care provider. Document Revised: 04/09/2018 Document Reviewed: 03/24/2016 Elsevier Patient Education  2020 [employer].

## 2019-04-08 NOTE — Progress Notes (Signed)
   PRENATAL INTAKE SUMMARY  Ms. Mignone presents today New OB Nurse Interview.  OB History    Gravida  8   Para  4   Term  4   Preterm  0   AB  2   Living  4     SAB  1   TAB  1   Ectopic  0   Multiple  0   Live Births  4          I have reviewed the patient's medical, obstetrical, social, and family histories, medications, and available lab results.  SUBJECTIVE She complains of pelvic pain.  OBJECTIVE Initial nurse interview for history/labs (New OB)  EDD: 10/01/2019 by early ultrasound GA:[redacted]w[redacted]d [id number] FHT: 140  GENERAL APPEARANCE: alert, well appearing, in no apparent distress, oriented to person, place and time   ASSESSMENT Normal pregnancy  PLAN Prenatal care-[facility]/HIV  OB Urine Culture GC/CT/PAP at next visit with Gerrit Heck, CNM 05/03/19 [facility]gb[facility]/SMA/[facility] ([facility]) [facility] A1C  P/C Ratio and CMP Patient to sign up for [facility] Rx for BP monitor and weight scale sent to [facility] Continue PNV Advised to take Extra Strength Tylenol or take a warm - never hot - bath or stand in the shower and let the water hit your back.  Clovis Pu, RN

## 2019-04-09 LAB — OBSTETRIC PANEL, INCLUDING HIV
Antibody Screen: NEGATIVE
Basophils Absolute: 0 10*3/uL (ref 0.0–0.2)
Basos: 0 %
EOS (ABSOLUTE): 0 10*3/uL (ref 0.0–0.4)
Eos: 1 %
HIV Screen 4th Generation wRfx: NONREACTIVE
Hematocrit: 33.7 % — ABNORMAL LOW (ref 34.0–46.6)
Hemoglobin: 11.2 g/dL (ref 11.1–15.9)
Hepatitis B Surface Ag: NEGATIVE
Immature Grans (Abs): 0 10*3/uL (ref 0.0–0.1)
Immature Granulocytes: 0 %
Lymphocytes Absolute: 1.6 10*3/uL (ref 0.7–3.1)
Lymphs: 22 %
MCH: 29.5 pg (ref 26.6–33.0)
MCHC: 33.2 g/dL (ref 31.5–35.7)
MCV: 89 fL (ref 79–97)
Monocytes Absolute: 0.3 10*3/uL (ref 0.1–0.9)
Monocytes: 4 %
Neutrophils Absolute: 5.3 10*3/uL (ref 1.4–7.0)
Neutrophils: 73 %
Platelets: 293 10*3/uL (ref 150–450)
RBC: 3.8 x10E6/uL (ref 3.77–5.28)
RDW: 14.2 % (ref 11.7–15.4)
RPR Ser Ql: NONREACTIVE
Rh Factor: POSITIVE
Rubella Antibodies, IGG: 5.04 index (ref >0.99)
WBC: 7.3 10*3/uL (ref 3.4–10.8)

## 2019-04-09 LAB — HEPATITIS C ANTIBODY: Hep C Virus Ab: <0.1 s/co ratio (ref 0.0–0.9)

## 2019-04-09 LAB — HEMOGLOBIN A1C
Est. average glucose Bld gHb Est-mCnc: 105 mg/dL
Hgb A1c MFr Bld: 5.3 % (ref 4.8–5.6)

## 2019-04-09 LAB — COMPREHENSIVE METABOLIC PANEL
ALT: 14 IU/L (ref 0–32)
AST: 19 IU/L (ref 0–40)
Albumin/Globulin Ratio: 1.5 (ref 1.2–2.2)
Albumin: 3.9 g/dL (ref 3.9–5.0)
Alkaline Phosphatase: 49 IU/L (ref 39–117)
BUN/Creatinine Ratio: 11 (ref 9–23)
BUN: 6 mg/dL (ref 6–20)
Bilirubin Total: 0.2 mg/dL (ref 0.0–1.2)
CO2: 21 mmol/L (ref 20–29)
Calcium: 9.6 mg/dL (ref 8.7–10.2)
Chloride: 104 mmol/L (ref 96–106)
Creatinine, Ser: 0.56 mg/dL — ABNORMAL LOW (ref 0.57–1.00)
GFR calc Af Amer: 148 mL/min/{1.73_m2} (ref >59)
GFR calc non Af Amer: 128 mL/min/{1.73_m2} (ref >59)
Globulin, Total: 2.6 g/dL (ref 1.5–4.5)
Glucose: 87 mg/dL (ref 65–99)
Potassium: 3.9 mmol/L (ref 3.5–5.2)
Sodium: 139 mmol/L (ref 134–144)
Total Protein: 6.5 g/dL (ref 6.0–8.5)

## 2019-04-09 LAB — PROTEIN / CREATININE RATIO, URINE
Creatinine, Urine: 143 mg/dL
Protein, Ur: 21.5 mg/dL
Protein/Creat Ratio: 150 mg/g creat (ref 0–200)

## 2019-04-10 LAB — URINE CULTURE, OB REFLEX: Organism ID, Bacteria: NO GROWTH

## 2019-04-10 LAB — CULTURE, OB URINE

## 2019-04-15 ENCOUNTER — Encounter: Payer: Self-pay | Admitting: General Practice

## 2019-04-17 ENCOUNTER — Encounter: Payer: Self-pay | Admitting: Student

## 2019-04-17 ENCOUNTER — Other Ambulatory Visit: Payer: Self-pay

## 2019-04-17 ENCOUNTER — Ambulatory Visit (INDEPENDENT_AMBULATORY_CARE_PROVIDER_SITE_OTHER): Payer: Medicaid Other | Admitting: Student

## 2019-04-17 ENCOUNTER — Other Ambulatory Visit (HOSPITAL_COMMUNITY)
Admission: RE | Admit: 2019-04-17 | Discharge: 2019-04-17 | Disposition: A | Discharge status: Home / Self Care | Payer: Medicaid Other | Source: Ambulatory Visit | Attending: Student | Admitting: Student

## 2019-04-17 VITALS — BP 119/74 | HR 85 | Temp 98.1°F | Wt 198.4 lb

## 2019-04-17 DIAGNOSIS — Z3480 Encounter for supervision of other normal pregnancy, unspecified trimester: Secondary | ICD-10-CM | POA: Insufficient documentation

## 2019-04-17 DIAGNOSIS — Z880 Allergy status to penicillin: Secondary | ICD-10-CM | POA: Insufficient documentation

## 2019-04-17 DIAGNOSIS — O99891 Other specified diseases and conditions complicating pregnancy: Secondary | ICD-10-CM

## 2019-04-17 DIAGNOSIS — Z8759 Personal history of other complications of pregnancy, childbirth and the puerperium: Secondary | ICD-10-CM | POA: Insufficient documentation

## 2019-04-17 DIAGNOSIS — M549 Dorsalgia, unspecified: Secondary | ICD-10-CM

## 2019-04-17 DIAGNOSIS — O99212 Obesity complicating pregnancy, second trimester: Secondary | ICD-10-CM

## 2019-04-17 MED ORDER — COMFORT FIT MATERNITY SUPP SM MISC: 1.0000 [IU] | Freq: Every day | 0 refills | Status: DC | PRN

## 2019-04-17 MED ORDER — ASPIRIN 81 MG PO CHEW: 81.0000 mg | CHEWABLE_TABLET | Freq: Every day | ORAL | 5 refills | Status: DC

## 2019-04-17 NOTE — Progress Notes (Signed)
Subjective:   Pamela Schaefer is a 29 y.o. [id number] at [redacted]w[redacted]d by LMP c/w 13 wk ultrasound being seen today for her first obstetrical visit.  Her obstetrical history is significant for obesity, pregnancy induced hypertension and grandmultiparity. Patient does intend to breast feed. Pregnancy history fully reviewed.  Patient reports backache.  HISTORY: OB History  Gravida Para Term Preterm AB Living  7 4 4  0 2 4  SAB TAB Ectopic Multiple Live Births  1 1 0 0 4    # Outcome Date GA Lbr Len/2nd Weight Sex Delivery Anes PTL Lv  7 Current           6 Term 10/17/13 [redacted]w[redacted]d 08:24 / 00:33 6 lb 4 oz (2.835 kg) F Vag-Spont EPI  LIV     Name: Pamela Schaefer     Apgar1: 9  Apgar5: 9  5 Term 12/08/12 [redacted]w[redacted]d 21:00 / 00:29 7 lb 12.3 oz (3.525 kg) F Vag-Spont EPI  LIV     Birth Comments: none     Name: Pamela Schaefer     Apgar1: 8  Apgar5: 9  4 Term 04/25/11 [redacted]w[redacted]d 08:43 / 00:17 8 lb 8 oz (3.856 kg) F Vag-Spont EPI  LIV     Birth Comments: NA     Name: Pamela Schaefer     Apgar1: 8  Apgar5: 9  3 Term 09/21/07 [redacted]w[redacted]d 08:00 6 lb 6 oz (2.892 kg) M Vag-Spont EPI  LIV  2 TAB           1 SAB              Birth Comments: System Generated. Please review and update pregnancy details.   Past Medical History:  Diagnosis Date  . PONV (postoperative nausea and vomiting)   . Pregnancy induced hypertension    Past Surgical History:  Procedure Laterality Date  . CLAVICLE SURGERY    . INDUCED ABORTION    . LAPAROSCOPIC APPENDECTOMY N/A 10/25/2014   Procedure: APPENDECTOMY LAPAROSCOPIC;  Surgeon: Greer Pickerel, MD;  Location: [facility] OR;  Service: General;  Laterality: N/A;   Family History  Problem Relation Age of Onset  . Heart disease Maternal Grandfather   . Kidney disease Maternal Grandfather        failure  . Heart disease Father   . Diabetes Maternal Grandmother   . Hypertension Maternal Grandmother   . Colon cancer Neg Hx   . Stomach cancer Neg Hx   . Rectal cancer Neg Hx   .  Esophageal cancer Neg Hx   . Liver cancer Neg Hx    Social History   Tobacco Use  . Smoking status: Former Smoker    Packs/day: 0.25    Types: Cigarettes  . Smokeless tobacco: Former Network engineer Use Topics  . Alcohol use: Not Currently    Alcohol/week: 0.0 standard drinks    Comment: social  . Drug use: No   Allergies  Allergen Reactions  . Penicillins Itching   Current Outpatient Medications on File Prior to Visit  Medication Sig Dispense Refill  . Blood Pressure Monitoring (BLOOD PRESSURE MONITOR AUTOMAT) DEVI 1 Device by Does not apply route daily. Automatic blood pressure cuff regular size. To monitor blood pressure regularly at home. ICD-10 code:Z34.90 1 each 0  . Misc. Devices (GOJJI WEIGHT SCALE) MISC 1 Device by Does not apply route daily as needed. To weight self daily as needed at home. ICD-10 code: Z34.90 1 each 0   No current facility-administered medications on  file prior to visit.     Indications for ASA therapy (per uptodate) One of the following: Previous pregnancy with preeclampsia, especially early onset and with an adverse outcome No Multifetal gestation No Chronic hypertension No Type 1 or 2 diabetes mellitus No Chronic kidney disease No Autoimmune disease (antiphospholipid syndrome, systemic lupus erythematosus) No  Two or more of the following: Nulliparity No Obesity (body mass index >30 kg/m2) Yes Family history of preeclampsia in mother or sister No Age ?35 years No Sociodemographic characteristics (African American race, low socioeconomic level) Yes Personal risk factors (eg, previous pregnancy with low birth weight or small for gestational age infant, previous adverse pregnancy outcome [eg, stillbirth], interval >10 years between pregnancies) No  Indications for early GDM screening - BMI >24 (>22 in Asian Americans) AND one of the following:  First-degree relative with diabetes No BMI >30kg/m2 Yes Age > 25 Yes Previous birth of an infant  weighing ?4000 g No Gestational diabetes mellitus in a previous pregnancy No Glycated hemoglobin ?5.7 percent (39 mmol/mol), impaired glucose tolerance, or impaired fasting glucose on previous testing No High-risk race/ethnicity (eg, African American, Latino, Native American, Asian American, Pacific Islander) Yes Previous stillbirth of unknown cause No Maternal birthweight > 9 lbs No History of cardiovascular disease No Hypertension or on therapy for hypertension No High-density lipoprotein cholesterol level <35 mg/dL (1.85 mmol/L) and/or a triglyceride level >250 mg/dL (6.31 mmol/L) No Polycystic ovary syndrome No Physical inactivity No Other clinical condition associated with insulin resistance (eg, severe obesity, acanthosis nigricans) No Current use of glucocorticoids No   Early screening tests: FBS, A1C, Random CBG, glucose challenge     Exam   Vitals:   04/17/19 1514  BP: 119/74  Pulse: 85  Temp: 98.1 F (36.7 C)  Weight: 198 lb 6.4 oz (90 kg)   Fetal Heart Rate (bpm): 150  Uterus:   16 wks size  Pelvic Exam: Perineum: no hemorrhoids, normal perineum   Vulva: normal external genitalia, no lesions   Vagina:  normal mucosa, normal discharge   Cervix: Closed. No CMT   Adnexa: normal adnexa and no mass, fullness, tenderness   Bony Pelvis: average  System: General: well-developed, well-nourished female in no acute distress   Breast:  normal appearance, no masses or tenderness   Skin: normal coloration and turgor, no rashes   Neurologic: oriented, normal, negative, normal mood   Extremities: normal strength, tone, and muscle mass, ROM of all joints is normal   HEENT PERRLA, extraocular movement intact and sclera clear, anicteric   Mouth/Teeth mucous membranes moist, pharynx normal without lesions and dental hygiene good   Neck supple and no masses   Cardiovascular: regular rate and rhythm   Respiratory:  no respiratory distress, normal breath sounds   Abdomen: soft,  non-tender; bowel sounds normal; no masses,  no organomegaly     Assessment:   Pregnancy: [id number] Patient Active Problem List   Diagnosis Date Noted  . Penicillin allergy 04/17/2019  . Obesity affecting pregnancy in second trimester 04/17/2019  . History of gestational hypertension 04/17/2019  . Supervision of other normal pregnancy, antepartum 04/08/2019  . Intrauterine pregnancy 03/31/2019  . Acute appendicitis 10/25/2014  . Anxiety 11/15/2013  . GERD without esophagitis 07/31/2012     Plan:  1. Supervision of other normal pregnancy, antepartum -reviewed new ob labs that were previously done -AFP will be drawn when she returns for anatomy ultrasound.  -Medicaid currently pending. Will get pap smear at postpartum visit.  -Possibly considering waterbirth. Given info  regarding water birth & encouraged to take class -interested in LARC vs BTL --- leaning towards LARC even though she's sure she doesn't want anymore children  - Cervicovaginal ancillary only( [place])  2. Penicillin allergy -get sensitivities with GBS swab at 36 wks  3. Back pain affecting pregnancy in second trimester  - Elastic Bandages & Supports (COMFORT FIT MATERNITY SUPP SM) MISC; 1 Units by Does not apply route daily as needed.  Dispense: 1 each; Refill: 0  4. History of gestational hypertension  - aspirin 81 MG chewable tablet; Chew 1 tablet (81 mg total) by mouth daily.  Dispense: 30 tablet; Refill: 5  5. Obesity affecting pregnancy in second trimester  - aspirin 81 MG chewable tablet; Chew 1 tablet (81 mg total) by mouth daily.  Dispense: 30 tablet; Refill: 5   Initial labs results reviewed Started on ASA Continue prenatal vitamins. Genetic Screening discussed, AFP and NIPS: ordered and reviewed. Ultrasound discussed; fetal anatomic survey: ordered. Problem list reviewed and updated. The nature of [facility] with multiple MDs and other Advanced Practice  Providers was explained to patient; also emphasized that residents, students are part of our team. Routine obstetric precautions reviewed. Return in about 4 weeks (around 05/15/2019) for Routine OB virtual.   Judeth Horn 4:09 PM 04/17/19

## 2019-04-17 NOTE — Patient Instructions (Addendum)
PREGNANCY SUPPORT BELT: You are not alone, Seventy-five percent of women have some sort of abdominal or back pain at some point in their pregnancy. Your baby is growing at a fast pace, which means that your whole body is rapidly trying to adjust to the changes. As your uterus grows, your back may start feeling a bit under stress and this can result in back or abdominal pain that can go from mild, and therefore bearable, to severe pains that will not allow you to sit or lay down comfortably, When it comes to dealing with pregnancy-related pains and cramps, some pregnant women usually prefer natural remedies, which the market is filled with nowadays. For example, wearing a pregnancy support belt can help ease and lessen your discomfort and pain. WHAT ARE THE BENEFITS OF WEARING A PREGNANCY SUPPORT BELT? A pregnancy support belt provides support to the lower portion of the belly taking some of the weight of the growing uterus and distributing to the other parts of your body. It is designed make you comfortable and gives you extra support. Over the years, the pregnancy apparel market has been studying the needs and wants of pregnant women and they have come up with the most comfortable pregnancy support belts that woman could ever ask for. In fact, you will no longer have to wear a stretched-out or bulky pregnancy belt that is visible underneath your clothes and makes you feel even more uncomfortable. Nowadays, a pregnancy support belt is made of comfortable and stretchy materials that will not irritate your skin but will actually make you feel at ease and you will not even notice you are wearing it. They are easy to put on and adjust during the day and can be worn at night for additional support.  BENEFITS: . Relives Back pain . Relieves Abdominal Muscle and Leg Pain . Stabilizes the Pelvic Ring . Offers a Cushioned Abdominal Lift Pad . Relieves pressure on the Sciatic Nerve Within Minutes WHERE TO GET  YOUR PREGNANCY BELT: [employer] [phone]  [place], [place] [address]         Considering Waterbirth? Guide for patients at [facility] [employer] [facility]  Why consider waterbirth?  . Gentle birth for babies . Less pain medicine used in labor . May allow for passive descent/less pushing . May reduce perineal tears  . More mobility and instinctive maternal position changes . Increased maternal relaxation . Reduced blood pressure in labor  Is waterbirth safe? What are the risks of infection, drowning or other complications?  . Infection: o Very low risk (3.7 % for tub vs 4.8% for bed) o 7 in 8000 waterbirths with documented infection o Poorly cleaned equipment most common cause o Slightly lower group B strep transmission rate  . Drowning o Maternal:  - Very low risk   - Related to seizures or fainting o Newborn:  - Very low risk. No evidence of increased risk of respiratory problems in multiple large studies - Physiological protection from breathing under water - Avoid underwater birth if there are any fetal complications - Once baby's head is out of the water, keep it out.  . Birth complication o Some reports of cord trauma, but risk decreased by bringing baby to surface gradually o No evidence of increased risk of shoulder dystocia. Mothers can usually change positions faster in water than in a bed, possibly aiding the maneuvers to free the shoulder.   You must attend a Doren Custard class at [facility]  3rd Wednesday of every month from [employer] by calling [phone] or online at [email or web address]  Bring [place] the certificate from the class to your prenatal appointment  Meet with a midwife at 36 weeks to see if you can still plan a waterbirth and to sign the consent.   If you plan a waterbirth at [facility] and [facility], you will need to purchase the following:  Fish  net  Bathing suit top (optional)  Long-handled mirror (optional)  Places to purchase or rent supplies:   [employer] for tub purchases and supplies  Waterbirthsolutions.com for tub purchases and supplies  [employer] (www.thelaborladies.com) $275 for tub rental/set-up & take down/kit   [employer] (http://www.fleming.com/.htm) Information regarding doulas (labor support) who provide pool rentals  Things that would prevent you from having a waterbirth:  Premature, <37wks  Previous cesarean birth  Presence of thick meconium-stained fluid  Multiple gestation (Twins, triplets, etc.)  Uncontrolled diabetes or gestational diabetes requiring medication  Hypertension requiring medication or diagnosis of pre-eclampsia  Heavy vaginal bleeding  Non-reassuring fetal heart rate  Active infection (MRSA, etc.). Group B Strep is NOT a contraindication for waterbirth.  If your labor has to be induced and induction method requires continuous monitoring of the baby's heart rate  Other risks/issues identified by your obstetrical provider  Please remember that birth is unpredictable. Under certain unforeseeable circumstances your provider may advise against giving birth in the tub. These decisions will be made on a case-by-case basis and with the safety of you and your baby as our highest priority.    Places to have your son circumcised (for self-pay patients//Medicaid now covers circumcisions):                                                                      [facility]     [phone]   $480 while you are in hospital         [facility]              [phone]   $269 by 4 wks                      [facility]                     [phone]   $269 by 7 days [facility]                    [phone]   $269 by 4 wks [facility]             [phone]   $225 by 2 wks    These prices sometimes change but are roughly what you can expect to pay. Please call and confirm  pricing.   There are many reasons parents decide to have their sons circumsized. During the first year of life circumcised males have a reduced risk of urinary tract infections but after this year the rates between circumcised males and uncircumcised males are the same.  It can reduce rate of HIV and other STI infection later in life.  It is safe to have your son circumcised outside of the hospital and the places above perform them  regularly.   Deciding about Circumcision in Baby Boys  (Up-to-date The Basics)  What is circumcision?   Circumcision is a surgery that removes the skin that covers the tip of the penis, called the "foreskin" Circumcision is usually done when a boy is between 74 and 25 days old. In the [place], circumcision is common. In some other countries, fewer boys are circumcised. Circumcision is a common tradition in some religions.  Should I have my baby boy circumcised?   There is no easy answer. Circumcision has some benefits. But it also has risks. After talking with your doctor, you will have to decide for yourself what is right for your family.  What are the benefits of circumcision?   Circumcised boys seem to have slightly lower rates of: ?Urinary tract infections ?Swelling of the opening at the tip of the penis Circumcised men seem to have slightly lower rates of: ?Urinary tract infections ?Swelling of the opening at the tip of the penis ?Penis cancer ?HIV and other infections that you catch during sex, or transmit to a future partner(s) ?Cervical cancer in the women they have sex with Even so, in the [place], the risks of these problems are small - even in boys and men who have not been circumcised. Plus, boys and men who are not circumcised can reduce these extra risks by: ?Cleaning their penis well ?Using condoms during sex  What are the risks of circumcision?  Risks include: ?Bleeding or infection from the surgery ?Damage to or amputation of  the penis ?A chance that the doctor will cut off too much or not enough of the foreskin ?A chance that sex won't feel as good later in life Only about 1 out of every 200 circumcisions leads to problems. There is also a chance that your health insurance won't pay for circumcision.  How is circumcision done in baby boys?  First, the baby gets medicine for pain relief. This might be a cream on the skin or a shot into the base of the penis. Next, the doctor cleans the baby's penis well. Then he or she uses special tools to cut off the foreskin. Finally, the doctor wraps a bandage (called gauze) around the baby's penis. If you have your baby circumcised, his doctor or nurse will give you instructions on how to care for him after the surgery. It is important that you follow those instructions carefully.

## 2019-04-19 LAB — CERVICOVAGINAL ANCILLARY ONLY
Bacterial Vaginitis (gardnerella): NEGATIVE
Candida Glabrata: NEGATIVE
Candida Vaginitis: NEGATIVE
Chlamydia: NEGATIVE
Comment: NEGATIVE
Comment: NEGATIVE
Comment: NEGATIVE
Comment: NEGATIVE
Comment: NEGATIVE
Comment: NORMAL
Neisseria Gonorrhea: NEGATIVE
Trichomonas: NEGATIVE

## 2019-04-22 ENCOUNTER — Encounter: Payer: Self-pay | Admitting: General Practice

## 2019-04-29 NOTE — Telephone Encounter (Signed)
Patient called regarding genetic result. Patient stated she has an appointment with [facility] on 04/29/19.   Clovis Pu, RN

## 2019-05-10 ENCOUNTER — Ambulatory Visit: Payer: Medicaid Other | Attending: Student

## 2019-05-10 ENCOUNTER — Other Ambulatory Visit: Payer: Self-pay

## 2019-05-10 ENCOUNTER — Other Ambulatory Visit: Payer: Self-pay | Admitting: Student

## 2019-05-10 DIAGNOSIS — O09292 Supervision of pregnancy with other poor reproductive or obstetric history, second trimester: Secondary | ICD-10-CM

## 2019-05-10 DIAGNOSIS — Z363 Encounter for antenatal screening for malformations: Secondary | ICD-10-CM | POA: Diagnosis not present

## 2019-05-10 DIAGNOSIS — Z3480 Encounter for supervision of other normal pregnancy, unspecified trimester: Secondary | ICD-10-CM | POA: Diagnosis present

## 2019-05-10 DIAGNOSIS — O99212 Obesity complicating pregnancy, second trimester: Secondary | ICD-10-CM

## 2019-05-10 DIAGNOSIS — Z148 Genetic carrier of other disease: Secondary | ICD-10-CM | POA: Diagnosis not present

## 2019-05-10 DIAGNOSIS — Z3A19 19 weeks gestation of pregnancy: Secondary | ICD-10-CM

## 2019-05-13 ENCOUNTER — Other Ambulatory Visit: Payer: Self-pay | Admitting: *Deleted

## 2019-05-13 DIAGNOSIS — O99212 Obesity complicating pregnancy, second trimester: Secondary | ICD-10-CM

## 2019-05-17 ENCOUNTER — Other Ambulatory Visit: Payer: Self-pay

## 2019-05-17 ENCOUNTER — Ambulatory Visit (INDEPENDENT_AMBULATORY_CARE_PROVIDER_SITE_OTHER): Payer: Medicaid Other

## 2019-05-17 VITALS — BP 108/70 | HR 90 | Temp 98.0°F | Wt 205.0 lb

## 2019-05-17 DIAGNOSIS — O26892 Other specified pregnancy related conditions, second trimester: Secondary | ICD-10-CM

## 2019-05-17 DIAGNOSIS — R102 Pelvic and perineal pain: Secondary | ICD-10-CM

## 2019-05-17 DIAGNOSIS — Z3480 Encounter for supervision of other normal pregnancy, unspecified trimester: Secondary | ICD-10-CM

## 2019-05-17 MED ORDER — GOJJI WEIGHT SCALE MISC: 1.0000 | Freq: Every day | 0 refills | Status: DC | PRN

## 2019-05-17 NOTE — Addendum Note (Signed)
Addended by: Clovis Pu on: 05/17/2019 11:42 AM   Modules accepted: Orders

## 2019-05-17 NOTE — Progress Notes (Signed)
   PRENATAL VISIT NOTE  Subjective:  Pamela Schaefer is a 29 y.o. [id number] at [redacted]w[redacted]d who presents today for routine prenatal care.  She is currently being monitored for supervision of a high-risk pregnancy with problems as listed below.  Patient reports some pelvic pressure that is constant, but denies issues with urination or abdominal cramping or pain .  She endorses fetal movement.  She denies vaginal concerns including discharge, bleeding, leaking, itching, and burning.   Patient Active Problem List   Diagnosis Date Noted  . Penicillin allergy 04/17/2019  . Obesity affecting pregnancy in second trimester 04/17/2019  . History of gestational hypertension 04/17/2019  . Supervision of other normal pregnancy, antepartum 04/08/2019  . Intrauterine pregnancy 03/31/2019  . Acute appendicitis 10/25/2014  . Anxiety 11/15/2013  . GERD without esophagitis 07/31/2012    The following portions of the patient's history were reviewed and updated as appropriate: allergies, current medications, past family history, past medical history, past social history, past surgical history and problem list. Problem list updated.  Objective:   Vitals:   05/17/19 1117  BP: 108/70  Pulse: 90  Temp: 98 F (36.7 C)  Weight: 205 lb (93 kg)    Fetal Status: Fetal Heart Rate (bpm): 157   Movement: Present   FH at umbilicus  General:  Alert, oriented and cooperative. Patient is in no acute distress.  Skin: Skin is warm and dry.   Cardiovascular: Regular rate and rhythm.  Respiratory: Normal respiratory effort. CTA-Bilaterally  Abdomen: Soft, gravid, appropriate for gestational age.  Pelvic: Cervical exam deferred        Extremities: Normal range of motion.  Edema: Trace  Mental Status: Normal mood and affect. Normal behavior. Normal judgment and thought content.   Assessment and Plan:  Pregnancy: [id number] at [redacted]w[redacted]d  1. Supervision of other normal pregnancy, antepartum -Anticipatory guidance for upcoming  visits. -Reviewed previous US and need for follow up. -Reviewed Glucola appt preparation including fasting the night before and morning of.   -Discussed anticipated office time of 2.5-3 hours.  -Reviewed blood draw procedures and labs which also include check of iron level.  -Discussed how results of GTT are handled including diabetic education and BS testing for abnormal results and routine care for normal results.  - AFP TETRA   2. Pelvic pain in pregnancy, antepartum, second trimester -Reviewed comfort measures for pelvic pain. -Discussed usage of maternity belt or band. -Discussed safety and usage of tylenol.  Preterm labor symptoms and general obstetric precautions including but not limited to vaginal bleeding, contractions, leaking of fluid and fetal movement were reviewed with the patient.  Please refer to After Visit Summary for other counseling recommendations.  No follow-ups on file.  Future Appointments  Date Time Provider Department Center  05/31/2019  3:45 PM [facility] NURSE [facility]  05/31/2019  3:45 PM [facility]    Cherre Robins, CNM 05/17/2019, 11:28 AM

## 2019-05-19 LAB — AFP TETRA
DIA Mom Value: 1.26
DIA Value (EIA): 191.28 pg/mL
DSR (By Age)    1 IN: 871
DSR (Second Trimester) 1 IN: 2010
Gestational Age: 20 WEEKS
MSAFP Mom: 0.78
MSAFP: 41.3 ng/mL
MSHCG Mom: 1.33
MSHCG: 26241 m[IU]/mL
Maternal Age At EDD: 27.8 yr
Osb Risk: 10000
T18 (By Age): 1:3393 {titer}
Test Results:: NEGATIVE
Weight: 205 [lb_av]
uE3 Mom: 1.59
uE3 Value: 3.28 ng/mL

## 2019-05-21 ENCOUNTER — Encounter: Payer: Self-pay | Admitting: General Practice

## 2019-05-21 ENCOUNTER — Other Ambulatory Visit: Payer: Self-pay | Admitting: *Deleted

## 2019-05-21 DIAGNOSIS — O99891 Other specified diseases and conditions complicating pregnancy: Secondary | ICD-10-CM

## 2019-05-21 MED ORDER — COMFORT FIT MATERNITY SUPP SM MISC: 1.0000 [IU] | Freq: Every day | 0 refills | Status: DC | PRN

## 2019-05-21 NOTE — Progress Notes (Signed)
Patient miss placed Rx for pregnancy belt. New Rx printed.   Clovis Pu, RN

## 2019-05-24 ENCOUNTER — Encounter: Payer: Self-pay | Admitting: General Practice

## 2019-05-31 ENCOUNTER — Other Ambulatory Visit: Payer: Self-pay

## 2019-05-31 ENCOUNTER — Ambulatory Visit: Payer: Medicaid Other | Admitting: *Deleted

## 2019-05-31 ENCOUNTER — Ambulatory Visit: Payer: Medicaid Other | Attending: Obstetrics and Gynecology

## 2019-05-31 DIAGNOSIS — Z3480 Encounter for supervision of other normal pregnancy, unspecified trimester: Secondary | ICD-10-CM | POA: Diagnosis present

## 2019-05-31 DIAGNOSIS — O99212 Obesity complicating pregnancy, second trimester: Secondary | ICD-10-CM | POA: Diagnosis not present

## 2019-05-31 DIAGNOSIS — Z880 Allergy status to penicillin: Secondary | ICD-10-CM | POA: Diagnosis present

## 2019-05-31 DIAGNOSIS — E669 Obesity, unspecified: Secondary | ICD-10-CM

## 2019-05-31 DIAGNOSIS — Z148 Genetic carrier of other disease: Secondary | ICD-10-CM

## 2019-05-31 DIAGNOSIS — Z3A22 22 weeks gestation of pregnancy: Secondary | ICD-10-CM

## 2019-05-31 DIAGNOSIS — O09292 Supervision of pregnancy with other poor reproductive or obstetric history, second trimester: Secondary | ICD-10-CM

## 2019-05-31 DIAGNOSIS — Z362 Encounter for other antenatal screening follow-up: Secondary | ICD-10-CM | POA: Diagnosis not present

## 2019-06-14 ENCOUNTER — Other Ambulatory Visit: Payer: Self-pay

## 2019-06-14 ENCOUNTER — Ambulatory Visit (INDEPENDENT_AMBULATORY_CARE_PROVIDER_SITE_OTHER): Payer: Medicaid Other

## 2019-06-14 VITALS — BP 110/70 | HR 93 | Wt 215.0 lb

## 2019-06-14 DIAGNOSIS — Z3480 Encounter for supervision of other normal pregnancy, unspecified trimester: Secondary | ICD-10-CM

## 2019-06-14 DIAGNOSIS — Z8759 Personal history of other complications of pregnancy, childbirth and the puerperium: Secondary | ICD-10-CM

## 2019-06-14 DIAGNOSIS — N760 Acute vaginitis: Secondary | ICD-10-CM

## 2019-06-14 DIAGNOSIS — Z3A24 24 weeks gestation of pregnancy: Secondary | ICD-10-CM

## 2019-06-14 DIAGNOSIS — O23592 Infection of other part of genital tract in pregnancy, second trimester: Secondary | ICD-10-CM

## 2019-06-14 MED ORDER — TERCONAZOLE 0.8 % VA CREA
1.0000 | TOPICAL_CREAM | Freq: Every day | VAGINAL | 0 refills | Status: DC
Start: 2019-06-14 — End: 2019-07-12

## 2019-06-14 NOTE — Patient Instructions (Addendum)
Considering Waterbirth? °Guide for patients at [facility] °Why consider waterbirth? °• Gentle birth for babies  °• Less pain medicine used in labor  °• May allow for passive descent/less pushing  °• May reduce perineal tears  °• More mobility and instinctive maternal position changes  °• Increased maternal relaxation  °• Reduced blood pressure in labor  ° °Is waterbirth safe? What are the risks of infection, drowning or other complications? °• Infection:  °• Very low risk (3.7 % for tub vs 4.8% for bed)  °• 7 in 8000 waterbirths with documented infection  °• Poorly cleaned equipment most common cause  °• Slightly lower group B strep transmission rate  °• Drowning  °• Maternal:  °• Very low risk  °• Related to seizures or fainting  °• Newborn:  °• Very low risk. No evidence of increased risk of respiratory problems in multiple large studies  °• Physiological protection from breathing under water  °• Avoid underwater birth if there are any fetal complications  °• Once baby's head is out of the water, keep it out.  °• Birth complication  °• Some reports of cord trauma, but risk decreased by bringing baby to surface gradually  °• No evidence of increased risk of shoulder dystocia. Mothers can usually change positions faster in water than in a bed, possibly aiding the maneuvers to free the shoulder.  °? °You must attend a Waterbirth class at [facility] [place] °• 3rd Wednesday of every month from 7-9pm  °• Free  °• Register by calling [phone] or online at [email or web address]  °• Bring us the certificate from the class to your prenatal appointment  °Meet with a midwife at 36 weeks to see if you can still plan a waterbirth and to sign the consent.  ° °If you plan a waterbirth at [facility] [place], you can opt to purchase the following: °• Fish Net °• Bathing suit top (optional)  °• Long-handled mirror (optional)  °•  °Things that would  prevent you from having a waterbirth: °• Unknown or Positive COVID-19 diagnosis upon admission to hospital  °• Premature, <37wks  °• Previous cesarean birth  °• Presence of thick meconium-stained fluid  °• Multiple gestation (Twins, triplets, etc.)  °• Uncontrolled diabetes or gestational diabetes requiring medication  °• Hypertension requiring medication or diagnosis of pre-eclampsia  °• Heavy vaginal bleeding  °• Non-reassuring fetal heart rate  °• Active infection (MRSA, etc.). Group B Strep is NOT a contraindication for waterbirth.  °• If your labor has to be induced and induction method requires continuous monitoring of the baby's heart rate  °• Other risks/issues identified by your obstetrical provider  °Please remember that birth is unpredictable. Under certain unforeseeable circumstances your provider may advise against giving birth in the tub. These decisions will be made on a case-by-case basis and with the safety of you and your baby as our highest priority. ° °**Please remember that in order to have a waterbirth, you must test Negative to COVID-19 upon admission to the hospital.** ° °

## 2019-06-14 NOTE — Progress Notes (Signed)
Pt c/o vaginal itching.  Recent ABx use Pt declines VE

## 2019-06-14 NOTE — Progress Notes (Signed)
   LOW-RISK PREGNANCY OFFICE VISIT  Patient name: Pamela Schaefer MRN [id number]  Date of birth: 28-Jan-1990 Chief Complaint:   Routine Prenatal Visit  Subjective:   [name] is a 29 y.o. [id number] female at [redacted]w[redacted]d with an Estimated Date of Delivery: 10/01/19 being seen today for ongoing management of a low-risk pregnancy. Today she reports vaginal irritation and itching.  She states she recently completed an antibiotic from the dentist, but noticed the itching with some vaginal swelling.  She states today the symptoms have improved, but only slightly.  Patient expresses desire for waterbirth.  Contractions: Not present. Vag. Bleeding: None.  Movement: Present. Denies leaking of fluid.  Reviewed past medical,surgical, social, obstetrical and family history as well as problem list, medications and allergies.  Objective   Vitals:   06/14/19 1115  BP: 110/70  Pulse: 93  Weight: 215 lb (97.5 kg)  Body mass index is 38.09 kg/m.        Physical Examination:   General appearance: Well appearing, and in no distress  Mental status: Alert, oriented to person, place, and time  Skin: Warm & dry  Cardiovascular: Normal heart rate noted  Respiratory: Normal respiratory effort, no distress  Abdomen: Soft, gravid, nontender, AGA with Fundal height of Fundal Height: 25 cm  Pelvic: Cervical exam deferred           Extremities: Edema: Trace  Fetal Status: Fetal Heart Rate (bpm): 141  Movement: Present   No results found for this or any previous visit (from the past 24 hour(s)).  Assessment & Plan:  Low-risk pregnancy of a 29 y.o., [id number] at [redacted]w[redacted]d with an Estimated Date of Delivery: 10/01/19   1. Supervision of other normal pregnancy, antepartum -Reviewed Glucola appt preparation including fasting the night before and morning of.   -Discussed anticipated office time of 2.5-3 hours.  -Reviewed blood draw procedures and labs which also include check of iron level.  -Discussed how results of  GTT are handled including diabetic education and BS testing for abnormal results and routine care for normal results.  -Patient expresses desire for waterbirth.  Given information regarding class enrollment and discussed potential changes, during pregnancy, that can cause her to no longer be a candidate (I.e. GDM, HTN)  2. Vaginitis during pregnancy in second trimester -Informed that testing would be deferred as symptoms suspicious for yeast infection esp in setting of recent antibiotic treatment. -Rx for Terazol 3 sent to pharmacy on file.  -Encouraged to call if symptoms don't improve  3. History of gestational hypertension -Taking baby aspirin daily -Instructed to take blood pressure weekly.  Parameters given.     Meds: No orders of the defined types were placed in this encounter.  Labs/procedures today:  Lab Orders  No laboratory test(s) ordered today     Reviewed: Preterm labor symptoms and general obstetric precautions including but not limited to vaginal bleeding, contractions, leaking of fluid and fetal movement were reviewed in detail with the patient.  All questions were answered.  Follow-up: No follow-ups on file.  No orders of the defined types were placed in this encounter.  Cherre Robins MSN, CNM 06/14/2019

## 2019-07-09 ENCOUNTER — Emergency Department (HOSPITAL_COMMUNITY)
Admission: EM | Admit: 2019-07-09 | Discharge: 2019-07-09 | Disposition: A | Discharge status: Home / Self Care | Payer: Medicaid Other | Attending: Emergency Medicine | Admitting: Emergency Medicine

## 2019-07-09 ENCOUNTER — Encounter (HOSPITAL_COMMUNITY): Payer: Self-pay | Admitting: Emergency Medicine

## 2019-07-09 ENCOUNTER — Other Ambulatory Visit: Payer: Self-pay

## 2019-07-09 DIAGNOSIS — K0889 Other specified disorders of teeth and supporting structures: Secondary | ICD-10-CM | POA: Diagnosis not present

## 2019-07-09 DIAGNOSIS — Z79899 Other long term (current) drug therapy: Secondary | ICD-10-CM | POA: Insufficient documentation

## 2019-07-09 DIAGNOSIS — Z87891 Personal history of nicotine dependence: Secondary | ICD-10-CM | POA: Diagnosis not present

## 2019-07-09 DIAGNOSIS — Z7982 Long term (current) use of aspirin: Secondary | ICD-10-CM | POA: Insufficient documentation

## 2019-07-09 DIAGNOSIS — Z3A28 28 weeks gestation of pregnancy: Secondary | ICD-10-CM | POA: Diagnosis not present

## 2019-07-09 DIAGNOSIS — O99613 Diseases of the digestive system complicating pregnancy, third trimester: Secondary | ICD-10-CM | POA: Insufficient documentation

## 2019-07-09 NOTE — Discharge Instructions (Addendum)
You will need to follow up with a dentist for your dental infection. You had antibiotics prescribed last month, therefore we will not be repeating the therapy.  You can take tylenol to help with pain along with heating pad to the area.

## 2019-07-09 NOTE — ED Triage Notes (Signed)
Reports left lower dental pain that began yesterday and worsened over night.

## 2019-07-09 NOTE — ED Notes (Signed)
Pt verbalized understanding of discharge instructions. Follow up care and pain management reviewed, RN also gave pt info regarding health department. Pt had no further questions.

## 2019-07-09 NOTE — Care Management (Signed)
ED CM consulted concerning dental referral. CM met with patient in Pamela Schaefer who reports needing a root canal but cannot afford the cost of $1200. Patient has Medicaid and was seen a [facility] and quoted $1200. Noted that Medicaid does not cover root canals. Patient was provided with information for Medicaid Dentist to follow up with. Updated EDP, no further ED CM needs identified

## 2019-07-09 NOTE — ED Provider Notes (Signed)
[facility] EMERGENCY DEPARTMENT Provider Note   CSN: [id number] Arrival date & time: 07/09/19  1636     History Chief Complaint  Patient presents with  . Dental Problem    Pamela Schaefer is a 29 y.o. female.  29 y.o female no PMH curretly 68 weeks0days pregnant presents to the ED with a chief complaint of dental pain x yesterday. Patient reports following up with a dentist in June, states that she was told she needed a root canal, placed on antibiotics.  Reports after taking these antibiotics which she is unsure what they were her pain along with discomfort resolved.  Today she reports worsening pain to the area, there is left lower swelling, she has been taking Tylenol around-the-clock without improvement in her symptoms.  She reports she is unable to afford root canal at this time, it seeking some financial assistance.  No fevers, difficulty tolerating her secretions, trismus.  The history is provided by the patient.       Past Medical History:  Diagnosis Date  . PONV (postoperative nausea and vomiting)   . Pregnancy induced hypertension     Patient Active Problem List   Diagnosis Date Noted  . Penicillin allergy 04/17/2019  . Obesity affecting pregnancy in second trimester 04/17/2019  . History of gestational hypertension 04/17/2019  . Supervision of other normal pregnancy, antepartum 04/08/2019  . Intrauterine pregnancy 03/31/2019  . Acute appendicitis 10/25/2014  . Anxiety 11/15/2013  . GERD without esophagitis 07/31/2012    Past Surgical History:  Procedure Laterality Date  . CLAVICLE SURGERY    . INDUCED ABORTION    . LAPAROSCOPIC APPENDECTOMY N/A 10/25/2014   Procedure: APPENDECTOMY LAPAROSCOPIC;  Surgeon: Gaynelle Adu, MD;  Location: [facility] OR;  Service: General;  Laterality: N/A;     OB History    Gravida  7   Para  4   Term  4   Preterm  0   AB  2   Living  4     SAB  1   TAB  1   Ectopic  0   Multiple  0   Live Births    4           Family History  Problem Relation Age of Onset  . Heart disease Maternal Grandfather   . Kidney disease Maternal Grandfather        failure  . Heart disease Father   . Diabetes Maternal Grandmother   . Hypertension Maternal Grandmother   . Colon cancer Neg Hx   . Stomach cancer Neg Hx   . Rectal cancer Neg Hx   . Esophageal cancer Neg Hx   . Liver cancer Neg Hx     Social History   Tobacco Use  . Smoking status: Former Smoker    Packs/day: 0.25    Types: Cigarettes  . Smokeless tobacco: Former Clinical biochemist  . Vaping Use: Never used  Substance Use Topics  . Alcohol use: Not Currently    Alcohol/week: 0.0 standard drinks    Comment: social  . Drug use: No    Home Medications Prior to Admission medications   Medication Sig Start Date End Date Taking? Authorizing Provider  aspirin 81 MG chewable tablet Chew 1 tablet (81 mg total) by mouth daily. 04/17/19   Judeth Horn, NP  Blood Pressure Monitoring (BLOOD PRESSURE MONITOR AUTOMAT) DEVI 1 Device by Does not apply route daily. Automatic blood pressure cuff regular size. To monitor blood pressure regularly at  home. ICD-10 code:Z34.90 04/08/19   Reva Bores, MD  clindamycin (CLEOCIN) 300 MG capsule Take 300 mg by mouth 4 (four) times daily.    [provider]  Elastic Bandages & Supports (COMFORT FIT MATERNITY SUPP SM) MISC 1 Units by Does not apply route daily as needed. Patient not taking: Reported on 06/14/2019 05/21/19   Raelyn Mora, CNM  Misc. Devices (GOJJI WEIGHT SCALE) MISC 1 Device by Does not apply route daily as needed. To weight self daily as needed at home. ICD-10 code: Z34.90 05/17/19   Reva Bores, MD  Prenatal Vit w/Fe-Methylfol-FA (PNV PO) Take by mouth.    [provider]  terconazole (TERAZOL 3) 0.8 % vaginal cream Place 1 applicator vaginally at bedtime. 06/14/19   Gerrit Heck, CNM    Allergies    Penicillins  Review of Systems   Review of Systems   Constitutional: Negative for fever.  HENT: Positive for dental problem.     Physical Exam Updated Vital Signs BP 114/61 (BP Location: Right Arm)   Pulse 89   Temp 99 F (37.2 C) (Oral)   Resp 16   LMP 12/25/2018   SpO2 100%   Physical Exam Vitals and nursing note reviewed.  Constitutional:      Appearance: Normal appearance.  HENT:     Head: Normocephalic and atraumatic.     Nose: Nose normal.     Mouth/Throat:     Mouth: Mucous membranes are moist.     Tonsils: No tonsillar exudate or tonsillar abscesses.      Comments: Mild erythema noted, swelling.  No dental abscess noted.  Mild facial swelling.  Oropharynx is clear without any exudates or tonsillar abscess. Cardiovascular:     Rate and Rhythm: Normal rate.  Pulmonary:     Effort: Pulmonary effort is normal.     Breath sounds: No wheezing.  Abdominal:     General: Abdomen is flat.     Tenderness: There is no abdominal tenderness.  Musculoskeletal:     Cervical back: Normal range of motion and neck supple.  Skin:    General: Skin is warm and dry.  Neurological:     Mental Status: She is alert and oriented to person, place, and time.     ED Results / Procedures / Treatments   Labs (all labs ordered are listed, but only abnormal results are displayed) Labs Reviewed - No data to display  EKG None  Radiology No results found.  Procedures Procedures (including critical care time)  Medications Ordered in ED Medications - No data to display  ED Course  I have reviewed the triage vital signs and the nursing notes.  Pertinent labs & imaging results that were available during my care of the patient were reviewed by me and considered in my medical decision making (see chart for details).    MDM Rules/Calculators/A&P   Patient with no PMH present to the ED with a chief complaint of dental pain x yesterday. Previously seen by dentist, given rx for abx with improvement in symptoms.  Was told she would need  a root canal, unable to afford this due to financial strain.  Evaluation left side of her face appears mildly swollen, there is no abscess present.  No trismus, phonation is intact.  I attempted to contact social work in order to have patient obtain dental follow-up, unfortunately does not qualify due to being insured via Medicaid.  We discussed her following up with her prior dentist, will ultimately need  procedure.  Unfortunately, patient is [redacted] weeks pregnant and we are unable to prescribe any other pain medication to help with her symptoms aside from Tylenol.  Patient's vitals are within normal limits, patient stable for discharge.   Portions of this note were generated with Scientist, clinical (histocompatibility and immunogenetics). Dictation errors may occur despite best attempts at proofreading.  Final Clinical Impression(s) / ED Diagnoses Final diagnoses:  Pain, dental    Rx / DC Orders ED Discharge Orders    None       Claude Manges, Cordelia Poche 07/09/19 Carlis Stable    Linwood Dibbles, MD 07/09/19 [phone]

## 2019-07-12 ENCOUNTER — Encounter: Payer: Self-pay | Admitting: General Practice

## 2019-07-12 ENCOUNTER — Other Ambulatory Visit: Payer: Self-pay

## 2019-07-12 ENCOUNTER — Encounter: Payer: Self-pay | Admitting: Advanced Practice Midwife

## 2019-07-12 ENCOUNTER — Ambulatory Visit (INDEPENDENT_AMBULATORY_CARE_PROVIDER_SITE_OTHER): Payer: Medicaid Other | Admitting: Advanced Practice Midwife

## 2019-07-12 VITALS — BP 108/69 | HR 102 | Temp 98.8°F | Wt 212.8 lb

## 2019-07-12 DIAGNOSIS — Z3A28 28 weeks gestation of pregnancy: Secondary | ICD-10-CM | POA: Diagnosis not present

## 2019-07-12 DIAGNOSIS — K047 Periapical abscess without sinus: Secondary | ICD-10-CM | POA: Diagnosis not present

## 2019-07-12 DIAGNOSIS — Z3480 Encounter for supervision of other normal pregnancy, unspecified trimester: Secondary | ICD-10-CM

## 2019-07-12 DIAGNOSIS — O99613 Diseases of the digestive system complicating pregnancy, third trimester: Secondary | ICD-10-CM | POA: Diagnosis not present

## 2019-07-12 MED ORDER — METRONIDAZOLE 500 MG PO TABS
500.0000 mg | ORAL_TABLET | Freq: Two times a day (BID) | ORAL | 0 refills | Status: DC
Start: 2019-07-12 — End: 2019-08-24

## 2019-07-12 MED ORDER — CEFUROXIME AXETIL 500 MG PO TABS: 500.0000 mg | ORAL_TABLET | Freq: Two times a day (BID) | ORAL | 0 refills | Status: DC

## 2019-07-12 MED ORDER — TERCONAZOLE 0.8 % VA CREA: 1.0000 | TOPICAL_CREAM | Freq: Every day | VAGINAL | 1 refills | Status: DC

## 2019-07-12 MED ORDER — OXYCODONE HCL 5 MG PO TABS: 5.0000 mg | ORAL_TABLET | ORAL | 0 refills | Status: AC | PRN

## 2019-07-12 NOTE — Patient Instructions (Signed)
Considering Waterbirth? Guide for patients at [facility] Why consider waterbirth?  Gentle birth for babies   Less pain medicine used in labor   May allow for passive descent/less pushing   May reduce perineal tears   More mobility and instinctive maternal position changes   Increased maternal relaxation   Reduced blood pressure in labor   Is waterbirth safe? What are the risks of infection, drowning or other complications?  Infection:   Very low risk (3.7 % for tub vs 4.8% for bed)   7 in 8000 waterbirths with documented infection   Poorly cleaned equipment most common cause   Slightly lower group B strep transmission rate   Drowning   Maternal:   Very low risk   Related to seizures or fainting   Newborn:   Very low risk. No evidence of increased risk of respiratory problems in multiple large studies   Physiological protection from breathing under water   Avoid underwater birth if there are any fetal complications   Once baby's head is out of the water, keep it out.   Birth complication   Some reports of cord trauma, but risk decreased by bringing baby to surface gradually   No evidence of increased risk of shoulder dystocia. Mothers can usually change positions faster in water than in a bed, possibly aiding the maneuvers to free the shoulder.  ? You must attend a Waterbirth class at [employer] at [facility]  3rd Wednesday of every month from 7-9pm   [facility] by calling [phone] or online at [email or web address]   Bring [place] the certificate from the class to your prenatal appointment  Meet with a midwife at 36 weeks to see if you can still plan a waterbirth and to sign the consent.   If you plan a waterbirth at [employer] [facility], you can opt to purchase the following:  Fish Net  Bathing suit top (optional)   Long-handled mirror (optional)    Things that would  prevent you from having a waterbirth:  Unknown or Positive COVID-19 diagnosis upon admission to hospital   Premature, <37wks   Previous cesarean birth   Presence of thick meconium-stained fluid   Multiple gestation (Twins, triplets, etc.)   Uncontrolled diabetes or gestational diabetes requiring medication   Hypertension requiring medication or diagnosis of pre-eclampsia   Heavy vaginal bleeding   Non-reassuring fetal heart rate   Active infection (MRSA, etc.). Group B Strep is NOT a contraindication for waterbirth.   If your labor has to be induced and induction method requires continuous monitoring of the baby's heart rate   Other risks/issues identified by your obstetrical provider  Please remember that birth is unpredictable. Under certain unforeseeable circumstances your provider may advise against giving birth in the tub. These decisions will be made on a case-by-case basis and with the safety of you and your baby as our highest priority.  **Please remember that in order to have a waterbirth, you must test Negative to COVID-19 upon admission to the hospital.**

## 2019-07-12 NOTE — Progress Notes (Signed)
   PRENATAL VISIT NOTE  Subjective:  Pamela Schaefer is a 29 y.o. [phone] at [redacted]w[redacted]d being seen today for ongoing prenatal care.  She is currently monitored for the following issues for this low-risk pregnancy and has GERD without esophagitis; Anxiety; Acute appendicitis; Intrauterine pregnancy; Supervision of other normal pregnancy, antepartum; Penicillin allergy; Obesity affecting pregnancy in second trimester; and History of gestational hypertension on their problem list.  Patient reports tooth pain.  Contractions: Irregular. Vag. Bleeding: None.  Movement: Present. Denies leaking of fluid.   Patient has called the dentist. No one is available until Monday. She was seen at the ED. No pain medication or antibiotics were prescribed. Patient with significant pain and facial swelling.    The following portions of the patient's history were reviewed and updated as appropriate: allergies, current medications, past family history, past medical history, past social history, past surgical history and problem list.   Objective:   Vitals:   07/12/19 0837  BP: 108/69  Pulse: (!) 102  Temp: 98.8 F (37.1 C)  Weight: 212 lb 12.8 oz (96.5 kg)    Fetal Status: Fetal Heart Rate (bpm): 156 Fundal Height: 28 cm Movement: Present     General:  Alert, oriented and cooperative. Patient is in no acute distress.  Skin: Skin is warm and dry. No rash noted.   Cardiovascular: Normal heart rate noted  Respiratory: Normal respiratory effort, no problems with respiration noted  Abdomen: Soft, gravid, appropriate for gestational age.  Pain/Pressure: Present     Pelvic: Cervical exam deferred        Extremities: Normal range of motion.  Edema: None  Mental Status: Normal mood and affect. Normal behavior. Normal judgment and thought content.   Assessment and Plan:  Pregnancy: [id number] at [redacted]w[redacted]d 1. Supervision of other normal pregnancy, antepartum - Glucose Tolerance, 2 Hours w/1 Hour - HIV Antibody (routine  testing w rflx) - RPR - CBC - Patient declined Tdap today   2. Dental abscess - Patient with significant pain and facial swelling. Dentist cannot see her until Monday. She was seen at the ED, but not pain medication or antibiotics were given. Patient has taken clindamycin for this in the past, but it did not fully clear the infection. She has taken keflex in the past without problems. RX: flagyl 500mg  BID x 7 days, Ceftin 500mg  BID x 7 days per up to date. Oxycodone 5mg  PRN #20 provided as well. Ok to continue with tylenol PRN as well. Warm salt water rinses, oral hygiene discussed.   Preterm labor symptoms and general obstetric precautions including but not limited to vaginal bleeding, contractions, leaking of fluid and fetal movement were reviewed in detail with the patient. Please refer to After Visit Summary for other counseling recommendations.   Return in about 4 weeks (around 08/09/2019) for virtual .  No future appointments.   DNP, CNM  07/12/19  9:47 AM

## 2019-07-13 LAB — HIV ANTIBODY (ROUTINE TESTING W REFLEX): HIV Screen 4th Generation wRfx: NONREACTIVE

## 2019-07-13 LAB — CBC
Hematocrit: 28.8 % — ABNORMAL LOW (ref 34.0–46.6)
Hemoglobin: 9.4 g/dL — ABNORMAL LOW (ref 11.1–15.9)
MCH: 29.5 pg (ref 26.6–33.0)
MCHC: 32.6 g/dL (ref 31.5–35.7)
MCV: 90 fL (ref 79–97)
Platelets: 271 10*3/uL (ref 150–450)
RBC: 3.19 x10E6/uL — ABNORMAL LOW (ref 3.77–5.28)
RDW: 14.7 % (ref 11.7–15.4)
WBC: 9.4 10*3/uL (ref 3.4–10.8)

## 2019-07-13 LAB — GLUCOSE TOLERANCE, 2 HOURS W/ 1HR
Glucose, 1 hour: 183 mg/dL — ABNORMAL HIGH (ref 65–179)
Glucose, 2 hour: 152 mg/dL (ref 65–152)
Glucose, Fasting: 84 mg/dL (ref 65–91)

## 2019-07-13 LAB — RPR: RPR Ser Ql: NONREACTIVE

## 2019-07-17 ENCOUNTER — Telehealth: Payer: Self-pay | Admitting: *Deleted

## 2019-07-17 ENCOUNTER — Encounter: Payer: Self-pay | Admitting: Advanced Practice Midwife

## 2019-07-17 DIAGNOSIS — O24419 Gestational diabetes mellitus in pregnancy, unspecified control: Secondary | ICD-10-CM

## 2019-07-17 HISTORY — DX: Gestational diabetes mellitus in pregnancy, unspecified control: O24.419

## 2019-07-17 MED ORDER — ACCU-CHEK FASTCLIX LANCET KIT: PACK | 0 refills | Status: DC

## 2019-07-17 MED ORDER — ACCU-CHEK GUIDE VI STRP: ORAL_STRIP | 12 refills | Status: DC

## 2019-07-17 MED ORDER — ACCU-CHEK GUIDE ME W/DEVICE KIT: PACK | 0 refills | Status: DC

## 2019-07-17 NOTE — Telephone Encounter (Signed)
-----   Message from Armando Reichert, CNM sent at 07/17/2019  8:35 AM EDT ----- Patient has GDM. She needs to get scheduled with the diabetes educator.

## 2019-07-30 ENCOUNTER — Ambulatory Visit: Payer: Medicaid Other | Admitting: Registered"

## 2019-07-30 ENCOUNTER — Encounter: Payer: Medicaid Other | Attending: Advanced Practice Midwife | Admitting: Registered"

## 2019-07-30 ENCOUNTER — Other Ambulatory Visit: Payer: Self-pay

## 2019-07-30 DIAGNOSIS — O24419 Gestational diabetes mellitus in pregnancy, unspecified control: Secondary | ICD-10-CM | POA: Diagnosis not present

## 2019-07-30 DIAGNOSIS — Z3A00 Weeks of gestation of pregnancy not specified: Secondary | ICD-10-CM | POA: Diagnosis not present

## 2019-07-30 NOTE — Progress Notes (Signed)
Patient was seen on 07/30/19 for Gestational Diabetes self-management. EDD 10/01/19. Patient states no history of GDM. Diet history obtained. Patient eats variety of all food groups, but not daily vegetable intake. Beverages include water.     Pt reports reduced appetite and pica craving (corn starch). Patient motivated to get adequate nutrition and preparing more food at home instead of fast food or microwave meals.   The following learning objectives were met by the patient :   States the definition of Gestational Diabetes  States why dietary management is important in controlling blood glucose  Describes the effects of carbohydrates on blood glucose levels  Demonstrates ability to create a balanced meal plan  Demonstrates carbohydrate counting   States when to check blood glucose levels  Demonstrates proper blood glucose monitoring techniques  States the effect of stress and exercise on blood glucose levels  States the importance of limiting caffeine and abstaining from alcohol and smoking  Plan:  Aim for 3 Carbohydrate Choices per meal (45 grams) +/- 1 either way  Aim for 1-2 Carbohydrate Choices per snack Begin reading food labels for Total Carbohydrate of foods If OK with your MD, consider  increasing your activity level by walking, Arm Chair Exercises or other activity daily as tolerated Begin checking Blood Glucose before breakfast and 2 hours after first bite of breakfast, lunch and dinner as directed by MD  Bring Log Book/Sheet and meter to every medical appointment  Baby Scripts: Patient's account was updated to include glucose management Take medication if directed by MD  Blood glucose monitor given: patient has meter, demonstrated use with demo meter.  Patient instructed to monitor glucose levels: FBS: 60 - 95 mg/dl 2 hour: <120 mg/dl  Patient received the following handouts:  Nutrition Diabetes and Pregnancy  Carbohydrate Counting List  Blood glucose Log  Sheet  Patient will be seen for follow-up as needed.

## 2019-08-01 ENCOUNTER — Other Ambulatory Visit: Payer: Self-pay

## 2019-08-01 ENCOUNTER — Other Ambulatory Visit: Payer: Self-pay | Admitting: *Deleted

## 2019-08-01 DIAGNOSIS — O24419 Gestational diabetes mellitus in pregnancy, unspecified control: Secondary | ICD-10-CM

## 2019-08-09 ENCOUNTER — Telehealth (INDEPENDENT_AMBULATORY_CARE_PROVIDER_SITE_OTHER): Payer: Medicaid Other

## 2019-08-09 VITALS — BP 125/71 | Wt 214.2 lb

## 2019-08-09 DIAGNOSIS — O09293 Supervision of pregnancy with other poor reproductive or obstetric history, third trimester: Secondary | ICD-10-CM

## 2019-08-09 DIAGNOSIS — O09899 Supervision of other high risk pregnancies, unspecified trimester: Secondary | ICD-10-CM

## 2019-08-09 DIAGNOSIS — O99343 Other mental disorders complicating pregnancy, third trimester: Secondary | ICD-10-CM

## 2019-08-09 DIAGNOSIS — O24410 Gestational diabetes mellitus in pregnancy, diet controlled: Secondary | ICD-10-CM

## 2019-08-09 DIAGNOSIS — Z3A32 32 weeks gestation of pregnancy: Secondary | ICD-10-CM

## 2019-08-09 DIAGNOSIS — Z880 Allergy status to penicillin: Secondary | ICD-10-CM

## 2019-08-09 MED ORDER — ACCU-CHEK SOFTCLIX LANCETS MISC: 12 refills | Status: DC

## 2019-08-09 NOTE — Progress Notes (Signed)
OBSTETRICS PRENATAL VIRTUAL VISIT ENCOUNTER NOTE  Provider location: Center for [facility]   I connected with [name] on 08/09/19 at 10:50 AM EDT by MyChart Video Encounter at home and verified that I am speaking with the correct person using two identifiers.   I discussed the limitations, risks, security and privacy concerns of performing an evaluation and management service virtually and the availability of in person appointments. I also discussed with the patient that there may be a patient responsible charge related to this service. The patient expressed understanding and agreed to proceed. Subjective:  Pamela Schaefer is a 29 y.o. [phone] at [redacted]w[redacted]d being seen today for ongoing prenatal care.  She is currently monitored for the following issues for this high-risk pregnancy and has GERD without esophagitis; Anxiety; Acute appendicitis; Intrauterine pregnancy; Supervision of other normal pregnancy, antepartum; Penicillin allergy; Obesity affecting pregnancy in second trimester; History of gestational hypertension; and Gestational diabetes on their problem list.  Patient reports occasional contractions. She states she was having contractions "back to back that was so intense they had me crying."  However, patient states she did not go to the hospital and took a shower and laid down.  She endorses continued contractions, but on an irregular basis. She reports some anal cramping that coincides with abdominal cramping/contractions.  She reports watery discharge that she notes after showers, but only on 3 incidents. She endorses fetal movement and denies vaginal bleeding.   Contractions: Irregular. Vag. Bleeding: None.  Movement: Present. Denies any leaking of fluid.   Patient states that due to an error at the pharmacy she received the wrong lancets and has been unable to log her BS.  Patient states she will be obtaining correct ones this weekend.  Patient reports that  she has been experiencing and fulfilling a craving for starch.   The following portions of the patient's history were reviewed and updated as appropriate: allergies, current medications, past family history, past medical history, past social history, past surgical history and problem list.   Objective:   Vitals:   08/09/19 1009  BP: 125/71  Weight: 214 lb 3.2 oz (97.2 kg)    Fetal Status:     Movement: Present     General:  Alert, oriented and cooperative. Patient is in no acute distress.  Respiratory: Normal respiratory effort, no problems with respiration noted  Mental Status: Normal mood and affect. Normal behavior. Normal judgment and thought content.  Rest of physical exam deferred due to type of encounter  Imaging: No results found.  Assessment and Plan:  Pregnancy: [id number] at [redacted]w[redacted]d 1. Supervision of other high-risk pregnancy, antepartum -Pregnancy changed to high risk due to GDM diagnosis. -Instructed to monitor and report to MAU for any contractions causing pain as previously experienced. -Reviewed waterbirth and informed that if GDM becomes uncontrolled would no longer be a candidate. -Discussed how blood pressure could also make patient ineligible for WB. -Instructed to submit/send certificate via Mychart. -Reviewed TWG of ~25 lbs. Encouraged to monitor.  2. Diet controlled gestational diabetes mellitus (GDM), antepartum -Reviewed limited BS log. -Instructed to maintian diabetic diet. -Reviewed diabetic complaint nutritional intake. -Discussed how starch craving can cause spikes in blood sugar.  Advised to avoid. -Patient reports some logs are incorrect due to erroneous entries. -Plan for growth [place] with BPP at 35-36 weeks.  -Will plan for virtual PNV in 2 weeks to review logs and determine need for further intervention.   3. Penicillin allergy -Plan to perform  sensitivities with GBS.  - Accu-Chek Softclix Lancets lancets; Use as instructed to check blood glucose  4 times daily.  Dispense: 100 each; Refill: 12  Preterm labor symptoms and general obstetric precautions including but not limited to vaginal bleeding, contractions, leaking of fluid and fetal movement were reviewed in detail with the patient. I discussed the assessment and treatment plan with the patient. The patient was provided an opportunity to ask questions and all were answered. The patient agreed with the plan and demonstrated an understanding of the instructions. The patient was advised to call back or seek an in-person office evaluation/go to MAU at [facility] for any urgent or concerning symptoms. Please refer to After Visit Summary for other counseling recommendations.   I provided 17 minutes of face-to-face time during this encounter.  Return in about 2 weeks (around 08/23/2019) for Virtual HR-ROB to review BS Log.  Future Appointments  Date Time Provider Department Center  08/14/2019  7:45 AM [facility] NURSE [facility]  08/14/2019  8:00 AM [facility] US1 [facility]  08/21/2019  1:30 PM Bernerd Limbo, CNM [facility] None    Cherre Robins, CNM Center for [employer], [facility]

## 2019-08-14 ENCOUNTER — Other Ambulatory Visit: Payer: Self-pay | Admitting: *Deleted

## 2019-08-14 ENCOUNTER — Ambulatory Visit: Payer: Medicaid Other

## 2019-08-14 ENCOUNTER — Other Ambulatory Visit: Payer: Self-pay

## 2019-08-14 DIAGNOSIS — Z148 Genetic carrier of other disease: Secondary | ICD-10-CM | POA: Diagnosis not present

## 2019-08-14 DIAGNOSIS — O09899 Supervision of other high risk pregnancies, unspecified trimester: Secondary | ICD-10-CM

## 2019-08-14 DIAGNOSIS — O99213 Obesity complicating pregnancy, third trimester: Secondary | ICD-10-CM

## 2019-08-14 DIAGNOSIS — O24410 Gestational diabetes mellitus in pregnancy, diet controlled: Secondary | ICD-10-CM | POA: Insufficient documentation

## 2019-08-14 DIAGNOSIS — Z880 Allergy status to penicillin: Secondary | ICD-10-CM | POA: Diagnosis present

## 2019-08-14 DIAGNOSIS — O09293 Supervision of pregnancy with other poor reproductive or obstetric history, third trimester: Secondary | ICD-10-CM

## 2019-08-14 DIAGNOSIS — E669 Obesity, unspecified: Secondary | ICD-10-CM

## 2019-08-14 DIAGNOSIS — Z3480 Encounter for supervision of other normal pregnancy, unspecified trimester: Secondary | ICD-10-CM | POA: Diagnosis present

## 2019-08-14 DIAGNOSIS — Z362 Encounter for other antenatal screening follow-up: Secondary | ICD-10-CM

## 2019-08-14 DIAGNOSIS — O24419 Gestational diabetes mellitus in pregnancy, unspecified control: Secondary | ICD-10-CM

## 2019-08-14 DIAGNOSIS — Z3A33 33 weeks gestation of pregnancy: Secondary | ICD-10-CM

## 2019-08-21 ENCOUNTER — Telehealth (INDEPENDENT_AMBULATORY_CARE_PROVIDER_SITE_OTHER): Payer: Medicaid Other | Admitting: Certified Nurse Midwife

## 2019-08-21 VITALS — BP 124/72 | HR 87 | Wt 214.0 lb

## 2019-08-21 DIAGNOSIS — Z3480 Encounter for supervision of other normal pregnancy, unspecified trimester: Secondary | ICD-10-CM

## 2019-08-21 DIAGNOSIS — Z3A34 34 weeks gestation of pregnancy: Secondary | ICD-10-CM | POA: Diagnosis not present

## 2019-08-21 DIAGNOSIS — O24410 Gestational diabetes mellitus in pregnancy, diet controlled: Secondary | ICD-10-CM | POA: Diagnosis not present

## 2019-08-21 DIAGNOSIS — Z880 Allergy status to penicillin: Secondary | ICD-10-CM

## 2019-08-21 NOTE — Progress Notes (Signed)
OBSTETRICS PRENATAL VIRTUAL VISIT ENCOUNTER NOTE  Provider location: [facility]   I attempted to connect with Pamela Schaefer on 08/21/19 at  1:30 PM EDT by MyChart Video Encounter at home, but the patient could not get connected so we converted to a phone visit.  I verified that I am speaking with the correct person using two identifiers.   I discussed the limitations, risks, security and privacy concerns of performing an evaluation and management service virtually and the availability of in person appointments. I also discussed with the patient that there may be a patient responsible charge related to this service. The patient expressed understanding and agreed to proceed. She will need in-person visits from now on. Subjective:  Pamela Schaefer is a 29 y.o. V3X1062 at [redacted]w[redacted]d being seen today for ongoing prenatal care.  She is currently monitored for the following issues for this low-risk pregnancy and has GERD without esophagitis; Anxiety; Acute appendicitis; Intrauterine pregnancy; Supervision of other normal pregnancy, antepartum; Penicillin allergy; Obesity affecting pregnancy in second trimester; History of gestational hypertension; and Gestational diabetes on their problem list.  Patient reports occasional painful contractions with increased pressure, in addition to pain/burning sensation in her pubic bone.  Contractions: Irregular. Vag. Bleeding: Bloody Show.  Movement: Present. Denies any leaking of fluid.   The following portions of the patient's history were reviewed and updated as appropriate: allergies, current medications, past family history, past medical history, past social history, past surgical history and problem list.   Objective:   Vitals:   08/21/19 1335 08/21/19 1337  BP: 133/73 124/72  Pulse: 95 87  Weight: 214 lb (97.1 kg)     Fetal Status:     Movement: Present     General:  Alert, oriented and cooperative. Patient is in no  acute distress.  Respiratory: Normal respiratory effort, no problems with respiration noted  Mental Status: Normal mood and affect. Normal behavior. Normal judgment and thought content.  Rest of physical exam deferred due to type of encounter  Imaging: [place] MFM FETAL BPP WO NON STRESS  Result Date: 08/14/2019 ----------------------------------------------------------------------  OBSTETRICS REPORT                       (Signed Final 08/14/2019 09:16 am) ---------------------------------------------------------------------- Patient Info  ID #:       [id number]                          D.O.B.:  23-Jul-1990 (28 yrs)  Name:       Pamela Schaefer               Visit Date: 08/14/2019 08:40 am ---------------------------------------------------------------------- Performed By  Attending:        Ma Rings MD         Secondary Phy.:   Calvert Cantor  Performed By:     Earley Brooke     [facility]  Referred By:      [facility] ---------------------------------------------------------------------- Orders  #  Description                           Code        Ordered By  1  [place] MFM FETAL BPP WO NON               76819.01    JESSICA EMLY     STRESS  2  [place] MFM OB FOLLOW UP                   [id number]    JESSICA EMLY ----------------------------------------------------------------------  #  Order #                     Accession #                Episode #  1  [id number]                   [id number]                 [id number]  2  [id number]                   [id number]                 [id number] ---------------------------------------------------------------------- Indications  Obesity complicating pregnancy, second          O99.212  trimester  [redacted] weeks gestation of pregnancy                Z3A.33  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)  Encounter for other antenatal screening        Z36.2  follow-up  Poor obstetric history: Previous               O09.299  preeclampsia / eclampsia/gestational HTN  Low Risk NIPS  Gestational diabetes in pregnancy, diet        O24.410  controlled ---------------------------------------------------------------------- Vital Signs                                                 Height:        5'3" ---------------------------------------------------------------------- Fetal Evaluation  Num Of Fetuses:         1  Fetal  Heart Rate(bpm):  146  Cardiac Activity:       Observed  Presentation:           Cephalic  Placenta:               Posterior  P. Cord Insertion:      Previously Visualized  Amniotic Fluid  AFI FV:      Within normal limits  AFI Sum(cm)     %Tile       Largest Pocket(cm)  15.25           54          5.61  RUQ(cm)       RLQ(cm)       LUQ(cm)        LLQ(cm)  5.61          2.69          4.19           2.76 ---------------------------------------------------------------------- Biophysical Evaluation  Amniotic F.V:   Within normal limits       F. Tone:        Observed  F. Movement:    Observed                   Score:          8/8  F. Breathing:   Observed ---------------------------------------------------------------------- Biometry  BPD:      83.4  mm     G. Age:  33w 4d         57  %    CI:        82.22   %    70 - 86                                                          FL/HC:      21.2   %    19.9 - 21.5  HC:      290.2  mm     G. Age:  32w 0d        2.7  %    HC/AC:      0.98        0.96 - 1.11  AC:      297.3  mm     G. Age:  33w 5d         68  %    FL/BPD:     73.7   %    71 - 87  FL:       61.5  mm     G. Age:  31w 6d         12  %    FL/AC:      20.7   %    20 - 24  HUM:      54.5  mm     G. Age:  31w 5d         30  %  LV:        3.6  mm  Est. FW:    2096  gm     4 lb 10 oz      36  % ---------------------------------------------------------------------- OB History  Gravidity:    8         Term:   4  SAB:   1  TOP:          1        Living:  4 ---------------------------------------------------------------------- Gestational Age  LMP:           33w 1d        Date:  12/25/18                 EDD:   10/01/19  U/S Today:     32w 6d                                        EDD:   10/03/19  Best:          33w 1d     Det. By:  LMP  (12/25/18)          EDD:   10/01/19 ---------------------------------------------------------------------- Anatomy  Cranium:               Appears normal         Aortic Arch:            Previously seen  Cavum:                 Previously seen        Ductal Arch:            Previously seen  Ventricles:            Appears normal         Diaphragm:              Appears normal  Choroid Plexus:        Previously seen        Stomach:                Appears normal, left                                                                        sided  Cerebellum:            Previously seen        Abdomen:                Appears normal  Posterior Fossa:       Previously seen        Abdominal Wall:         Previously seen  Nuchal Fold:           Previously seen        Cord Vessels:           Previously seen  Face:                  Orbits previously      Kidneys:                Appear normal                         seen  Lips:                  Previously seen        Bladder:  Appears normal  Thoracic:              Appears normal         Spine:                  Previously seen  Heart:                 Appears normal         Upper Extremities:      Previously seen                         (4CH, axis, and                         situs)  RVOT:                  Previously seen        Lower Extremities:      Previously seen  LVOT:                  Previously seen  Other:  Female gender.Open Hands not visualized. Technically difficult due to          maternal  habitus and fetal position. ---------------------------------------------------------------------- Cervix Uterus Adnexa  Cervix  Not visualized (advanced GA >24wks)  Right Ovary  Previously seen  Left Ovary  Previously seen. ---------------------------------------------------------------------- Comments  This patient was seen for a follow up growth scan due to  recently diagnosed gestational diabetes.  The patient reports  that she has only been treated with diet modification.  She is  monitoring her fingersticks daily and reports that her  fingersticks have mostly been within normal limits.  She was informed that the fetal growth and amniotic fluid  level appears appropriate for her gestational age.  The implications and management of diabetes in pregnancy  was discussed in detail with the patient. She was advised that  our goals for her fingerstick values are fasting values of 90-95  or less and two-hour postprandials of 120 or less.  Should her  fingerstick values be above these values, she may have to be  started on insulin or metformin to help her achieve better  glycemic control. The patient was advised that getting her  fingerstick values as close to these goals as possible would  provide her with the most optimal obstetrical outcome.  The patient was advised that should the fetal growth appear  normal and she maintains normal glucose control, that  delivery at around 39 weeks is usually recommended.  She  reports one prior uncomplicated vaginal delivery of an infant  weighing 8 pounds at term.  A follow up exam was scheduled in 4 weeks.  Weekly fetal  testing would be indicated should she require insulin or  Metformin for treatment of gestational diabetes. ----------------------------------------------------------------------                   Ma Rings, MD Electronically Signed Final Report   08/14/2019 09:16 am ----------------------------------------------------------------------  [place] MFM OB FOLLOW  UP  Result Date: 08/14/2019 ----------------------------------------------------------------------  OBSTETRICS REPORT                       (Signed Final 08/14/2019 09:16 am) ---------------------------------------------------------------------- Patient Info  ID #:       [id number]  D.O.B.:  Aug 16, 1990 (28 yrs)  Name:       Pamela Schaefer               Visit Date: 08/14/2019 08:40 am ---------------------------------------------------------------------- Performed By  Attending:        Ma Rings MD         Secondary Phy.:   Calvert Cantor  Performed By:     Earley Brooke     [facility]  Referred By:      [facility] ---------------------------------------------------------------------- Orders  #  Description                           Code        Ordered By  1  [place] MFM FETAL BPP WO NON               76819.01    JESSICA EMLY     STRESS  2  [place] MFM OB FOLLOW UP                   [id number]    JESSICA EMLY ----------------------------------------------------------------------  #  Order #                     Accession #                Episode #  1  [id number]                   [id number]                 [id number]  2  [id number]                   [id number]                 [id number] ---------------------------------------------------------------------- Indications  Obesity complicating pregnancy, second         O99.212  trimester  [redacted] weeks gestation of pregnancy  Z3A.33  Genetic carrier Technical brewer-         Z14.8  Thalassemia)  Encounter for other antenatal screening        Z36.2  follow-up  Poor obstetric history: Previous               O09.299  preeclampsia /  eclampsia/gestational HTN  Low Risk NIPS  Gestational diabetes in pregnancy, diet        O24.410  controlled ---------------------------------------------------------------------- Vital Signs                                                 Height:        5'3" ---------------------------------------------------------------------- Fetal Evaluation  Num Of Fetuses:         1  Fetal Heart Rate(bpm):  146  Cardiac Activity:       Observed  Presentation:           Cephalic  Placenta:               Posterior  P. Cord Insertion:      Previously Visualized  Amniotic Fluid  AFI FV:      Within normal limits  AFI Sum(cm)     %Tile       Largest Pocket(cm)  15.25           54          5.61  RUQ(cm)       RLQ(cm)       LUQ(cm)        LLQ(cm)  5.61          2.69          4.19           2.76 ---------------------------------------------------------------------- Biophysical Evaluation  Amniotic F.V:   Within normal limits       F. Tone:        Observed  F. Movement:    Observed                   Score:          8/8  F. Breathing:   Observed ---------------------------------------------------------------------- Biometry  BPD:      83.4  mm     G. Age:  33w 4d         57  %    CI:        82.22   %    70 - 86                                                          FL/HC:      21.2   %    19.9 - 21.5  HC:      290.2  mm     G. Age:  32w 0d        2.7  %    HC/AC:      0.98        0.96 - 1.11  AC:      297.3  mm     G. Age:  33w 5d         68  %  FL/BPD:     73.7   %    71 - 87  FL:       61.5  mm     G. Age:  31w 6d         12  %    FL/AC:      20.7   %    20 - 24  HUM:      54.5  mm     G. Age:  31w 5d         30  %  LV:        3.6  mm  Est. FW:    2096  gm    4 lb 10 oz      36  % ---------------------------------------------------------------------- OB History  Gravidity:    8         Term:   4         SAB:   1  TOP:          1        Living:  4 ---------------------------------------------------------------------- Gestational  Age  LMP:           33w 1d        Date:  12/25/18                 EDD:   10/01/19  U/S Today:     32w 6d                                        EDD:   10/03/19  Best:          33w 1d     Det. By:  LMP  (12/25/18)          EDD:   10/01/19 ---------------------------------------------------------------------- Anatomy  Cranium:               Appears normal         Aortic Arch:            Previously seen  Cavum:                 Previously seen        Ductal Arch:            Previously seen  Ventricles:            Appears normal         Diaphragm:              Appears normal  Choroid Plexus:        Previously seen        Stomach:                Appears normal, left                                                                        sided  Cerebellum:            Previously seen        Abdomen:                Appears normal  Posterior Fossa:  Previously seen        Abdominal Wall:         Previously seen  Nuchal Fold:           Previously seen        Cord Vessels:           Previously seen  Face:                  Orbits previously      Kidneys:                Appear normal                         seen  Lips:                  Previously seen        Bladder:                Appears normal  Thoracic:              Appears normal         Spine:                  Previously seen  Heart:                 Appears normal         Upper Extremities:      Previously seen                         (4CH, axis, and                         situs)  RVOT:                  Previously seen        Lower Extremities:      Previously seen  LVOT:                  Previously seen  Other:  Female gender.Open Hands not visualized. Technically difficult due to          maternal habitus and fetal position. ---------------------------------------------------------------------- Cervix Uterus Adnexa  Cervix  Not visualized (advanced GA >24wks)  Right Ovary  Previously seen  Left Ovary  Previously seen.  ---------------------------------------------------------------------- Comments  This patient was seen for a follow up growth scan due to  recently diagnosed gestational diabetes.  The patient reports  that she has only been treated with diet modification.  She is  monitoring her fingersticks daily and reports that her  fingersticks have mostly been within normal limits.  She was informed that the fetal growth and amniotic fluid  level appears appropriate for her gestational age.  The implications and management of diabetes in pregnancy  was discussed in detail with the patient. She was advised that  our goals for her fingerstick values are fasting values of 90-95  or less and two-hour postprandials of 120 or less.  Should her  fingerstick values be above these values, she may have to be  started on insulin or metformin to help her achieve better  glycemic control. The patient was advised that getting her  fingerstick values as close to these goals as possible would  provide her with the most optimal obstetrical outcome.  The patient was advised that should the fetal growth appear  normal  and she maintains normal glucose control, that  delivery at around 39 weeks is usually recommended.  She  reports one prior uncomplicated vaginal delivery of an infant  weighing 8 pounds at term.  A follow up exam was scheduled in 4 weeks.  Weekly fetal  testing would be indicated should she require insulin or  Metformin for treatment of gestational diabetes. ----------------------------------------------------------------------                   Ma Rings, MD Electronically Signed Final Report   08/14/2019 09:16 am ----------------------------------------------------------------------   Assessment and Plan:  Pregnancy: [id number] at [redacted]w[redacted]d 1. Supervision of other normal pregnancy, antepartum - All future visits will need to be in person as pt cannot access virtual visits on her mobile phone  2. [redacted] weeks gestation of  pregnancy - Anticipatory guidance given regarding GBS testing at next visit  3. Diet controlled gestational diabetes mellitus (GDM) in third trimester - Strongly encouraged patient to consistently test and log her blood sugars so we can be sure diet is controlling her GDM to prevent adverse effects to the baby. Pt verbalized understanding. Suggested she use a notebook to track, we can help her enter at visits if needed.  Preterm labor symptoms and general obstetric precautions including but not limited to vaginal bleeding, contractions, leaking of fluid and fetal movement were reviewed in detail with the patient. I discussed the assessment and treatment plan with the patient. The patient was provided an opportunity to ask questions and all were answered. The patient agreed with the plan and demonstrated an understanding of the instructions. The patient was advised to call back or seek an in-person office evaluation/go to [facility] at [facility] for any urgent or concerning symptoms. Please refer to After Visit Summary for other counseling recommendations.   I provided 0 minutes of face-to-face time during this encounter, but on the phone.  Return in about 2 weeks (around 09/04/2019) for IN-PERSON, LOB w GBS.  Future Appointments  Date Time Provider Department Center  09/05/2019 11:30 AM Raelyn Mora, CNM [facility] None  09/11/2019 10:30 AM [facility] NURSE [facility]  09/11/2019 10:45 AM [facility] US4 [facility]  09/19/2019 10:30 AM Raelyn Mora, CNM [facility] None   Edd Arbour, CNM, MSN, [facility] 08/21/19 3:20 PM

## 2019-08-24 ENCOUNTER — Other Ambulatory Visit: Payer: Self-pay

## 2019-08-24 ENCOUNTER — Encounter (HOSPITAL_COMMUNITY): Payer: Self-pay | Admitting: Obstetrics and Gynecology

## 2019-08-24 ENCOUNTER — Inpatient Hospital Stay (HOSPITAL_COMMUNITY)
Admission: AD | Admit: 2019-08-24 | Discharge: 2019-08-24 | Disposition: A | Discharge status: Home / Self Care | Payer: Medicaid Other | Attending: Obstetrics and Gynecology | Admitting: Obstetrics and Gynecology

## 2019-08-24 DIAGNOSIS — O133 Gestational [pregnancy-induced] hypertension without significant proteinuria, third trimester: Secondary | ICD-10-CM | POA: Insufficient documentation

## 2019-08-24 DIAGNOSIS — O24419 Gestational diabetes mellitus in pregnancy, unspecified control: Secondary | ICD-10-CM | POA: Insufficient documentation

## 2019-08-24 DIAGNOSIS — B373 Candidiasis of vulva and vagina: Secondary | ICD-10-CM | POA: Insufficient documentation

## 2019-08-24 DIAGNOSIS — Z79899 Other long term (current) drug therapy: Secondary | ICD-10-CM | POA: Diagnosis not present

## 2019-08-24 DIAGNOSIS — Z833 Family history of diabetes mellitus: Secondary | ICD-10-CM | POA: Insufficient documentation

## 2019-08-24 DIAGNOSIS — B3731 Acute candidiasis of vulva and vagina: Secondary | ICD-10-CM

## 2019-08-24 DIAGNOSIS — Z3A34 34 weeks gestation of pregnancy: Secondary | ICD-10-CM

## 2019-08-24 DIAGNOSIS — Z8249 Family history of ischemic heart disease and other diseases of the circulatory system: Secondary | ICD-10-CM | POA: Diagnosis not present

## 2019-08-24 DIAGNOSIS — Z7982 Long term (current) use of aspirin: Secondary | ICD-10-CM | POA: Diagnosis not present

## 2019-08-24 DIAGNOSIS — Z87891 Personal history of nicotine dependence: Secondary | ICD-10-CM | POA: Insufficient documentation

## 2019-08-24 DIAGNOSIS — Z880 Allergy status to penicillin: Secondary | ICD-10-CM | POA: Diagnosis not present

## 2019-08-24 DIAGNOSIS — O4703 False labor before 37 completed weeks of gestation, third trimester: Secondary | ICD-10-CM | POA: Insufficient documentation

## 2019-08-24 DIAGNOSIS — O98813 Other maternal infectious and parasitic diseases complicating pregnancy, third trimester: Secondary | ICD-10-CM | POA: Insufficient documentation

## 2019-08-24 DIAGNOSIS — O479 False labor, unspecified: Secondary | ICD-10-CM

## 2019-08-24 MED ORDER — TERCONAZOLE 0.4 % VA CREA: 1.0000 | TOPICAL_CREAM | Freq: Every day | VAGINAL | 0 refills | Status: DC

## 2019-08-24 NOTE — Discharge Instructions (Signed)
Braxton Hicks Contractions °Contractions of the uterus can occur throughout pregnancy, but they are not always a sign that you are in labor. You may have practice contractions called Braxton Hicks contractions. These false labor contractions are sometimes confused with true labor. °What are Braxton Hicks contractions? °Braxton Hicks contractions are tightening movements that occur in the muscles of the uterus before labor. Unlike true labor contractions, these contractions do not result in opening (dilation) and thinning of the cervix. Toward the end of pregnancy (32-34 weeks), Braxton Hicks contractions can happen more often and may become stronger. These contractions are sometimes difficult to tell apart from true labor because they can be very uncomfortable. You should not feel embarrassed if you go to the hospital with false labor. °Sometimes, the only way to tell if you are in true labor is for your health care provider to look for changes in the cervix. The health care provider will do a physical exam and may monitor your contractions. If you are not in true labor, the exam should show that your cervix is not dilating and your water has not broken. °If there are no other health problems associated with your pregnancy, it is completely safe for you to be sent home with false labor. You may continue to have Braxton Hicks contractions until you go into true labor. °How to tell the difference between true labor and false labor °True labor °· Contractions last 30-70 seconds. °· Contractions become very regular. °· Discomfort is usually felt in the top of the uterus, and it spreads to the lower abdomen and low back. °· Contractions do not go away with walking. °· Contractions usually become more intense and increase in frequency. °· The cervix dilates and gets thinner. °False labor °· Contractions are usually shorter and not as strong as true labor contractions. °· Contractions are usually irregular. °· Contractions  are often felt in the front of the lower abdomen and in the groin. °· Contractions may go away when you walk around or change positions while lying down. °· Contractions get weaker and are shorter-lasting as time goes on. °· The cervix usually does not dilate or become thin. °Follow these instructions at home: ° °· Take over-the-counter and prescription medicines only as told by your health care provider. °· Keep up with your usual exercises and follow other instructions from your health care provider. °· Eat and drink lightly if you think you are going into labor. °· If Braxton Hicks contractions are making you uncomfortable: °? Change your position from lying down or resting to walking, or change from walking to resting. °? Sit and rest in a tub of warm water. °? Drink enough fluid to keep your urine pale yellow. Dehydration may cause these contractions. °? Do slow and deep breathing several times an hour. °· Keep all follow-up prenatal visits as told by your health care provider. This is important. °Contact a health care provider if: °· You have a fever. °· You have continuous pain in your abdomen. °Get help right away if: °· Your contractions become stronger, more regular, and closer together. °· You have fluid leaking or gushing from your vagina. °· You pass blood-tinged mucus (bloody show). °· You have bleeding from your vagina. °· You have low back pain that you never had before. °· You feel your baby’s head pushing down and causing pelvic pressure. °· Your baby is not moving inside you as much as it used to. °Summary °· Contractions that occur before labor are   called Braxton Hicks contractions, false labor, or practice contractions. °· Braxton Hicks contractions are usually shorter, weaker, farther apart, and less regular than true labor contractions. True labor contractions usually become progressively stronger and regular, and they become more frequent. °· Manage discomfort from Braxton Hicks contractions  by changing position, resting in a warm bath, drinking plenty of water, or practicing deep breathing. °This information is not intended to replace advice given to you by your health care provider. Make sure you discuss any questions you have with your health care provider. °Document Revised: 12/02/2016 Document Reviewed: 05/05/2016 °Elsevier Patient Education © 2020 [employer] ° °

## 2019-08-24 NOTE — MAU Provider Note (Signed)
History     CSN: [id number]  Arrival date and time: 08/24/19 2215   First Provider Initiated Contact with Patient 08/24/19 2258      Chief Complaint  Patient presents with  . Contractions   HPI  [name] is a 29 y.o. female [phone] @ 89w4dhere with preterm contractions. The contractions started at 2 hours ago. States the contractions have slowed down significantly. She currently rates her pain 0/10. States she had a few uncomfortable contractions which is what brought her in.  States she saw dried blood on her panty liner. None now. + fetal movement.  + vaginitis, has been treating with 2 days OTC of terazol- reports she is using it on the outside of vagina only.    OB History    Gravida  7   Para  4   Term  4   Preterm  0   AB  2   Living  4     SAB  1   TAB  1   Ectopic  0   Multiple  0   Live Births  4           Past Medical History:  Diagnosis Date  . Gestational diabetes 07/17/2019  . PONV (postoperative nausea and vomiting)   . Pregnancy induced hypertension     Past Surgical History:  Procedure Laterality Date  . CLAVICLE SURGERY    . INDUCED ABORTION    . LAPAROSCOPIC APPENDECTOMY N/A 10/25/2014   Procedure: APPENDECTOMY LAPAROSCOPIC;  Surgeon: EGreer Pickerel MD;  [facility];  Service: General;  Laterality: N/A;    Family History  Problem Relation Age of Onset  . Heart disease Maternal Grandfather   . Kidney disease Maternal Grandfather        failure  . Heart disease Father   . Diabetes Maternal Grandmother   . Hypertension Maternal Grandmother   . Colon cancer Neg Hx   . Stomach cancer Neg Hx   . Rectal cancer Neg Hx   . Esophageal cancer Neg Hx   . Liver cancer Neg Hx     Social History   Tobacco Use  . Smoking status: Former Smoker    Packs/day: 0.25    Types: Cigarettes  . Smokeless tobacco: Former UNetwork engineer . Vaping Use: Never used  Substance Use Topics  . Alcohol use: Not Currently     Alcohol/week: 0.0 standard drinks    Comment: social  . Drug use: No    Allergies:  Allergies  Allergen Reactions  . Penicillins Itching    Medications Prior to Admission  Medication Sig Dispense Refill Last Dose  . Accu-Chek Softclix Lancets lancets Use as instructed to check blood glucose 4 times daily. 100 each 12 08/24/2019 at Unknown time  . aspirin 81 MG chewable tablet Chew 1 tablet (81 mg total) by mouth daily. 30 tablet 5 08/23/2019 at Unknown time  . Blood Glucose Monitoring Suppl (ACCU-CHEK GUIDE ME) w/Device KIT Use to test blood glucose level 4 times daily. ICD-10 code: 024.419 1 kit 0 08/24/2019 at Unknown time  . Blood Pressure Monitoring (BLOOD PRESSURE MONITOR AUTOMAT) DEVI 1 Device by Does not apply route daily. Automatic blood pressure cuff regular size. To monitor blood pressure regularly at home. ICD-10 code:Z34.90 1 each 0 08/24/2019 at Unknown time  . cefUROXime (CEFTIN) 500 MG tablet Take 1 tablet (500 mg total) by mouth 2 (two) times daily with a meal. 14 tablet 0 Past Week at Unknown  time  . clindamycin (CLEOCIN) 300 MG capsule Take 300 mg by mouth 4 (four) times daily.   Past Week at Unknown time  . glucose blood (ACCU-CHEK GUIDE) test strip Use as instructed to test blood glucose level 4 times daily. ICD-10 code: 024.419 100 each 12 08/24/2019 at Unknown time  . Lancets Misc. (ACCU-CHEK FASTCLIX LANCET) KIT Test blood sugar 4 times daily. ICD-10 code: 024.419 1 kit 0 08/24/2019 at Unknown time  . Prenatal Vit w/Fe-Methylfol-FA (PNV PO) Take by mouth.   08/24/2019 at Unknown time  . terconazole (TERAZOL 3) 0.8 % vaginal cream Place 1 applicator vaginally at bedtime. 20 g 1 08/24/2019 at Unknown time  . Elastic Bandages & Supports (COMFORT FIT MATERNITY SUPP SM) MISC 1 Units by Does not apply route daily as needed. (Patient not taking: Reported on 06/14/2019) 1 each 0   . metroNIDAZOLE (FLAGYL) 500 MG tablet Take 1 tablet (500 mg total) by mouth 2 (two) times daily. 14 tablet  0   . Misc. Devices (GOJJI WEIGHT SCALE) MISC 1 Device by Does not apply route daily as needed. To weight self daily as needed at home. ICD-10 code: Z34.90 1 each 0    No results found for this or any previous visit (from the past 48 hour(s)).  Review of Systems  Gastrointestinal: Positive for abdominal pain. Negative for diarrhea, nausea, rectal pain and vomiting.  Genitourinary: Positive for vaginal discharge.   Physical Exam   Blood pressure 129/62, pulse 88, temperature 98.5 F (36.9 C), temperature source Oral, resp. rate 19, height '5\' 3"'  (1.6 m), weight 98.4 kg, last menstrual period 12/25/2018, SpO2 100 %, unknown if currently breastfeeding.  Physical Exam Constitutional:      General: She is not in acute distress.    Appearance: Normal appearance. She is obese. She is not ill-appearing or toxic-appearing.  HENT:     Head: Normocephalic.  Eyes:     Pupils: Pupils are equal, round, and reactive to light.  Genitourinary:    Vagina: Vaginal discharge present.     Comments: Vagina - Small amount of white vaginal discharge, no odor, + thick, clumpy discharge. No blood noted  Cervix - No contact bleeding, no active bleeding  Bimanual exam: Cervix closed, thick posterior.  Chaperone present for exam.  Musculoskeletal:        General: Normal range of motion.  Neurological:     Mental Status: She is alert.  Psychiatric:        Mood and Affect: Mood normal.        Behavior: Behavior normal.    Fetal Tracing: Baseline: 130 bpm Variability: Moderate  Accelerations: 15x15 Decelerations: None Toco: UI  MAU Course  Procedures None  MDM  Pain 0/10 Cervix closed. + yeast noted on exam, no bleeding.   Assessment and Plan   A:  1. Braxton Hicks contractions   2. Vaginal yeast infection   3. [redacted] weeks gestation of pregnancy     P:  Discharge home in stable condition  Reassurance given Return to MAU if symptoms worsen Increase oral fluid intake Follow up with  OB.   Lezlie Lye, NP 08/26/2019 3:56 PM

## 2019-08-24 NOTE — MAU Note (Signed)
.   Pamela Schaefer is a 29 y.o. at [redacted]w[redacted]d here in MAU reporting: ctx that started at 2100, she states that her doctor told her to come in if she had began to have ctx in a pattern and they were x2 minutes apart. Patient states she had old blood on her panty liner today. She also states that yesterday she had some leaking of yellow discharge. Endorses good fetal movememnt.   Pain score: 8 Vitals:   08/24/19 2238  BP: 129/62  Pulse: 88  Resp: 19  Temp: 98.5 F (36.9 C)  SpO2: 100%     FHT:144 Lab orders placed from triage: UA

## 2019-08-25 ENCOUNTER — Inpatient Hospital Stay (HOSPITAL_COMMUNITY): Payer: Medicaid Other

## 2019-08-25 ENCOUNTER — Inpatient Hospital Stay (HOSPITAL_COMMUNITY)
Admission: AD | Admit: 2019-08-25 | Discharge: 2019-08-25 | Disposition: A | Discharge status: Home / Self Care | Payer: Medicaid Other | Attending: Obstetrics and Gynecology | Admitting: Obstetrics and Gynecology

## 2019-08-25 ENCOUNTER — Encounter (HOSPITAL_COMMUNITY): Payer: Self-pay | Admitting: Obstetrics and Gynecology

## 2019-08-25 DIAGNOSIS — O99891 Other specified diseases and conditions complicating pregnancy: Secondary | ICD-10-CM

## 2019-08-25 DIAGNOSIS — Z79899 Other long term (current) drug therapy: Secondary | ICD-10-CM | POA: Insufficient documentation

## 2019-08-25 DIAGNOSIS — Z3480 Encounter for supervision of other normal pregnancy, unspecified trimester: Secondary | ICD-10-CM

## 2019-08-25 DIAGNOSIS — O133 Gestational [pregnancy-induced] hypertension without significant proteinuria, third trimester: Secondary | ICD-10-CM | POA: Diagnosis not present

## 2019-08-25 DIAGNOSIS — I517 Cardiomegaly: Secondary | ICD-10-CM | POA: Insufficient documentation

## 2019-08-25 DIAGNOSIS — Z7982 Long term (current) use of aspirin: Secondary | ICD-10-CM | POA: Diagnosis not present

## 2019-08-25 DIAGNOSIS — Z880 Allergy status to penicillin: Secondary | ICD-10-CM | POA: Insufficient documentation

## 2019-08-25 DIAGNOSIS — B373 Candidiasis of vulva and vagina: Secondary | ICD-10-CM | POA: Insufficient documentation

## 2019-08-25 DIAGNOSIS — O24419 Gestational diabetes mellitus in pregnancy, unspecified control: Secondary | ICD-10-CM | POA: Insufficient documentation

## 2019-08-25 DIAGNOSIS — Z3A34 34 weeks gestation of pregnancy: Secondary | ICD-10-CM | POA: Diagnosis not present

## 2019-08-25 DIAGNOSIS — R0789 Other chest pain: Secondary | ICD-10-CM | POA: Diagnosis not present

## 2019-08-25 DIAGNOSIS — Z87891 Personal history of nicotine dependence: Secondary | ICD-10-CM | POA: Diagnosis not present

## 2019-08-25 DIAGNOSIS — Z3689 Encounter for other specified antenatal screening: Secondary | ICD-10-CM

## 2019-08-25 DIAGNOSIS — R079 Chest pain, unspecified: Secondary | ICD-10-CM | POA: Diagnosis not present

## 2019-08-25 DIAGNOSIS — Z8249 Family history of ischemic heart disease and other diseases of the circulatory system: Secondary | ICD-10-CM | POA: Insufficient documentation

## 2019-08-25 DIAGNOSIS — B3731 Acute candidiasis of vulva and vagina: Secondary | ICD-10-CM

## 2019-08-25 DIAGNOSIS — O98813 Other maternal infectious and parasitic diseases complicating pregnancy, third trimester: Secondary | ICD-10-CM | POA: Insufficient documentation

## 2019-08-25 DIAGNOSIS — O99413 Diseases of the circulatory system complicating pregnancy, third trimester: Secondary | ICD-10-CM | POA: Diagnosis present

## 2019-08-25 LAB — URINALYSIS, ROUTINE W REFLEX MICROSCOPIC
Bilirubin Urine: NEGATIVE
Glucose, UA: NEGATIVE mg/dL
Hgb urine dipstick: NEGATIVE
Ketones, ur: 80 mg/dL — AB
Nitrite: NEGATIVE
Protein, ur: NEGATIVE mg/dL
Specific Gravity, Urine: 1.013 (ref 1.005–1.030)
pH: 7 (ref 5.0–8.0)

## 2019-08-25 LAB — COMPREHENSIVE METABOLIC PANEL
ALT: 9 U/L (ref 0–44)
AST: 15 U/L (ref 15–41)
Albumin: 2.8 g/dL — ABNORMAL LOW (ref 3.5–5.0)
Alkaline Phosphatase: 69 U/L (ref 38–126)
Anion gap: 10 (ref 5–15)
BUN: 5 mg/dL — ABNORMAL LOW (ref 6–20)
CO2: 23 mmol/L (ref 22–32)
Calcium: 8.8 mg/dL — ABNORMAL LOW (ref 8.9–10.3)
Chloride: 104 mmol/L (ref 98–111)
Creatinine, Ser: 0.66 mg/dL (ref 0.44–1.00)
GFR calc Af Amer: >60 mL/min (ref >60)
GFR calc non Af Amer: >60 mL/min (ref >60)
Glucose, Bld: 89 mg/dL (ref 70–99)
Potassium: 3.2 mmol/L — ABNORMAL LOW (ref 3.5–5.1)
Sodium: 137 mmol/L (ref 135–145)
Total Bilirubin: 0.7 mg/dL (ref 0.3–1.2)
Total Protein: 5.7 g/dL — ABNORMAL LOW (ref 6.5–8.1)

## 2019-08-25 MED ORDER — SIMETHICONE 80 MG PO CHEW
80.0000 mg | CHEWABLE_TABLET | Freq: Four times a day (QID) | ORAL | Status: DC | PRN
  Administered 2019-08-25: 80 mg via ORAL
  Filled 2019-08-25: qty 1

## 2019-08-25 MED ORDER — IOHEXOL 350 MG/ML SOLN
55.0000 mL | Freq: Once | INTRAVENOUS | Status: AC | PRN
  Administered 2019-08-25: 55 mL via INTRAVENOUS

## 2019-08-25 MED ORDER — FLUCONAZOLE 150 MG PO TABS: ORAL_TABLET | ORAL | 0 refills | Status: DC

## 2019-08-25 MED ORDER — CYCLOBENZAPRINE HCL 5 MG PO TABS
10.0000 mg | ORAL_TABLET | Freq: Once | ORAL | Status: AC
  Administered 2019-08-25: 10 mg via ORAL
  Filled 2019-08-25: qty 2

## 2019-08-25 NOTE — MAU Note (Signed)
Pamela Schaefer is a 29 y.o. at [redacted]w[redacted]d here in MAU reporting: started having chest pain around noon. Having a little bit shortness of breath. Denies bleeding or LOF. Having contractions 5 minutes apart, states they are the same as last night, pt reports she came in because of the chest pain not because of the contractions. +FM  Onset of complaint: today  Pain score: chest pain 6/10, contractions 10/10  Vitals:   08/25/19 1518  BP: (!) 106/58  Pulse: 85  Resp: 18  Temp: 98.5 F (36.9 C)  SpO2: 100%     FHT:147  Lab orders placed from triage: none

## 2019-08-25 NOTE — Discharge Instructions (Signed)

## 2019-08-25 NOTE — MAU Provider Note (Addendum)
Chief Complaint:  Chest Pain and Shortness of Breath   First Provider Initiated Contact with Patient 08/25/19 1642      HPI: Pamela Schaefer is a 29 y.o. [id number] at 69w5dwho presents to maternity admissions reporting onset of chest pain this morning. She describes the pain as mid/upper left chest wall, increases with movement and certain positions.  There are still contractions, for which she was evaluated 08/24/19, which are unchanged.  She has not tried any treatments. There is mild associated shortness of breath. She reports good fetal movement.     Location: mid upper left chest Quality: sharp/pressure Severity: 6/10 on pain scale Duration: less than 1 day  Timing: constant Modifying factors: worse with movement/certain positions Associated signs and symptoms: mild SOB  Past Medical History: Past Medical History:  Diagnosis Date  . Gestational diabetes 07/17/2019  . PONV (postoperative nausea and vomiting)   . Pregnancy induced hypertension     Past obstetric history: OB History  Gravida Para Term Preterm AB Living  _0 0 2 4  SAB TAB Ectopic Multiple Live Births  1 1 0 0 4    # Outcome Date GA Lbr Len/2nd Weight Sex Delivery Anes PTL Lv  7 Current           6 Term 10/17/13 351w1d8:24 / 00:33 2835 g F Vag-Spont EPI  LIV  5 Term 12/08/12 4076w0d:00 / 00:29 3525 g F Vag-Spont EPI  LIV     Birth Comments: none  4 Term 04/25/11 40w50w3d43 / 00:17 3856 g F Vag-Spont EPI  LIV     Birth Comments: NA  3 Term 09/21/07 41w09w0d0 2892 g M Vag-Spont EPI  LIV  2 TAB           1 SAB              Birth Comments: System Generated. Please review and update pregnancy details.    Past Surgical History: Past Surgical History:  Procedure Laterality Date  . CLAVICLE SURGERY    . INDUCED ABORTION    . LAPAROSCOPIC APPENDECTOMY N/A 10/25/2014   Procedure: APPENDECTOMY LAPAROSCOPIC;  Surgeon: Eric Greer Pickerel  [facility]: General;  Laterality: N/A;    Family  History: Family History  Problem Relation Age of Onset  . Heart disease Maternal Grandfather   . Kidney disease Maternal Grandfather        failure  . Heart disease Father   . Diabetes Maternal Grandmother   . Hypertension Maternal Grandmother   . Colon cancer Neg Hx   . Stomach cancer Neg Hx   . Rectal cancer Neg Hx   . Esophageal cancer Neg Hx   . Liver cancer Neg Hx     Social History: Social History   Tobacco Use  . Smoking status: Former Smoker    Packs/day: 0.25    Types: Cigarettes  . Smokeless tobacco: Former User Network engineeraping Use: Never used  Substance Use Topics  . Alcohol use: Not Currently    Alcohol/week: 0.0 standard drinks    Comment: social  . Drug use: No    Allergies:  Allergies  Allergen Reactions  . Penicillins Itching    Meds:  Medications Prior to Admission  Medication Sig Dispense Refill Last Dose  . Accu-Chek Softclix Lancets lancets Use as instructed to check blood glucose 4 times daily. 100 each 12   . aspirin 81 MG chewable tablet Chew 1 tablet (81  mg total) by mouth daily. 30 tablet 5   . Blood Glucose Monitoring Suppl (ACCU-CHEK GUIDE ME) w/Device KIT Use to test blood glucose level 4 times daily. ICD-10 code: 024.419 1 kit 0   . Blood Pressure Monitoring (BLOOD PRESSURE MONITOR AUTOMAT) DEVI 1 Device by Does not apply route daily. Automatic blood pressure cuff regular size. To monitor blood pressure regularly at home. ICD-10 code:Z34.90 1 each 0   . cefUROXime (CEFTIN) 500 MG tablet Take 1 tablet (500 mg total) by mouth 2 (two) times daily with a meal. 14 tablet 0   . clindamycin (CLEOCIN) 300 MG capsule Take 300 mg by mouth 4 (four) times daily.     Clinical research associate Bandages & Supports (COMFORT FIT MATERNITY SUPP SM) MISC 1 Units by Does not apply route daily as needed. (Patient not taking: Reported on 06/14/2019) 1 each 0   . glucose blood (ACCU-CHEK GUIDE) test strip Use as instructed to test blood glucose level 4 times daily. ICD-10  code: 024.419 100 each 12   . Lancets Misc. (ACCU-CHEK FASTCLIX LANCET) KIT Test blood sugar 4 times daily. ICD-10 code: 024.419 1 kit 0   . Misc. Devices (GOJJI WEIGHT SCALE) MISC 1 Device by Does not apply route daily as needed. To weight self daily as needed at home. ICD-10 code: Z34.90 1 each 0   . Prenatal Vit w/Fe-Methylfol-FA (PNV PO) Take by mouth.     . terconazole (TERAZOL 3) 0.8 % vaginal cream Place 1 applicator vaginally at bedtime. 20 g 1   . terconazole (TERAZOL 7) 0.4 % vaginal cream Place 1 applicator vaginally at bedtime. 45 g 0     ROS:  Review of Systems  Constitutional: Negative for chills, fatigue and fever.  Eyes: Negative for visual disturbance.  Respiratory: Positive for chest tightness and shortness of breath.   Cardiovascular: Positive for chest pain.  Gastrointestinal: Positive for abdominal pain. Negative for constipation, diarrhea, nausea and vomiting.  Genitourinary: Positive for pelvic pain. Negative for difficulty urinating, dysuria, flank pain, vaginal bleeding, vaginal discharge and vaginal pain.  Musculoskeletal: Positive for back pain.  Neurological: Negative for dizziness and headaches.  Psychiatric/Behavioral: Negative.    I have reviewed patient's Past Medical Hx, Surgical Hx, Family Hx, Social Hx, medications and allergies.   Physical Exam   Patient Vitals for the past 24 hrs:  BP Temp Temp src Pulse Resp SpO2  08/25/19 1518 (!) 106/58 98.5 F (36.9 C) Oral 85 18 100 %   Constitutional: Well-developed, well-nourished female in no acute distress.  HEART: normal rate, heart sounds, regular rhythm RESP: normal effort, lung sounds clear and equal bilaterally GI: Abd soft, non-tender, gravid appropriate for gestational age.  MS: Extremities nontender, no edema, normal ROM Neurologic: Alert and oriented x 4.  GU: Neg CVAT.   Dilation: Closed Effacement (%): Thick Cervical Position: Posterior Station: Ballotable Exam by:: Sharen Counter, CNM   FHT:  Baseline 125 , moderate variability, accelerations present, no decelerations Contractions: irregular, mild to palpation   Labs: Results for orders placed or performed during the hospital encounter of 08/25/19 (from the past 24 hour(s))  Urinalysis, Routine w reflex microscopic Urine, Clean Catch     Status: Abnormal   Collection Time: 08/25/19  3:27 PM  Result Value Ref Range   Color, Urine YELLOW YELLOW   APPearance CLEAR CLEAR   Specific Gravity, Urine 1.013 1.005 - 1.030   pH 7.0 5.0 - 8.0   Glucose, UA NEGATIVE NEGATIVE mg/dL   Hgb urine  dipstick NEGATIVE NEGATIVE   Bilirubin Urine NEGATIVE NEGATIVE   Ketones, ur 80 (A) NEGATIVE mg/dL   Protein, ur NEGATIVE NEGATIVE mg/dL   Nitrite NEGATIVE NEGATIVE   Leukocytes,Ua MODERATE (A) NEGATIVE   RBC / HPF 0-5 0 - 5 RBC/hpf   WBC, UA 0-5 0 - 5 WBC/hpf   Bacteria, UA RARE (A) NONE SEEN   Squamous Epithelial / LPF 0-5 0 - 5   Mucus PRESENT    O/Positive/-- (04/05 0949)  Media Information   Document Information  Photos    08/25/2019 17:00  Attached To:  Hospital Encounter on 08/25/19  Source Information  Leftwich-Kirby, Kathie Dike, CNM  [facility] Assess    Imaging:   CT ANGIO CHEST PE W OR WO CONTRAST  Result Date: 08/25/2019 CLINICAL DATA:  Mid to upper left chest wall pain, pain with motion, 34 weeks and 5 days pregnant EXAM: CT ANGIOGRAPHY CHEST WITH CONTRAST TECHNIQUE: Multidetector CT imaging of the chest was performed using the standard protocol during bolus administration of intravenous contrast. Multiplanar CT image reconstructions and MIPs were obtained to evaluate the vascular anatomy. CONTRAST:  63m OMNIPAQUE IOHEXOL 350 MG/ML SOLN COMPARISON:  None. FINDINGS: Cardiovascular: This is a technically adequate evaluation of the pulmonary vasculature. No filling defects or pulmonary emboli. Heart is mildly enlarged, likely due to gravid state. Thoracic aorta is grossly normal.  Mediastinum/Nodes: No enlarged mediastinal, hilar, or axillary lymph nodes. Thyroid gland, trachea, and esophagus demonstrate no significant findings. There is a small hiatal hernia. Lungs/Pleura: No airspace disease, effusion, or pneumothorax. Central airways are patent. Upper Abdomen: No acute abnormality. Musculoskeletal: No acute or destructive bony lesions. Reconstructed images demonstrate no additional findings. Review of the MIP images confirms the above findings. IMPRESSION: 1. No evidence of pulmonary embolus. 2. No acute intrathoracic process. 3. Mild cardiomegaly, likely due to gravid state. Electronically Signed   By: MRanda NgoM.D.   On: 08/25/2019 22:15    MAU Course/MDM: Orders Placed This Encounter  Procedures  . Urinalysis, Routine w reflex microscopic Urine, Clean Catch  . ED EKG    Meds ordered this encounter  Medications  . simethicone (MYLICON) chewable tablet 80 mg     NST reviewed and reactive Cervix closed so no evidence of preterm labor Pt with yeast infection, not yet treated, discussed options and Rx for Diflucan sent to pharmacy, pt prefers to topical treatments  VS all wnl, no acute abdomen, pain in chest is not reproducible to palpation Pt has hx gas pain which feels similar so given normal EKG results, will try simethicone dose for symptoms. Pt with ketones in urine, reports she hasn't eatien or had anything to drink today because of the pain. PO fluids given in MAU. No relief with simethecone so Flexeril 10 mg PO given x 1 dose No relief with Flexeril Pain in left upper chest continues so CT scan ordered to evaluate for PE or other lung abnormalities  Report to MJulianne Handler CNM   LFatima BlankCertified Nurse-Midwife 08/25/2019 2100  CT reviewed, no evidence of PE. Pain only with movement, suspect MSK. Discussed comfort measures, heat and Tylenol. Stable for discharge home.  A/P: 1. Penicillin allergy   2. Supervision of other normal  pregnancy, antepartum   3. Vaginal candidiasis   4. Chest pain, unspecified type   5. [redacted] weeks gestation of pregnancy   6. NST (non-stress test) reactive   7. Muscular chest pain    [facility] Return to MAU for OB concerns  Allergies as of 08/25/2019      Reactions   Penicillins Itching      Medication List    STOP taking these medications   cefUROXime 500 MG tablet Commonly known as: CEFTIN   clindamycin 300 MG capsule Commonly known as: CLEOCIN   terconazole 0.4 % vaginal cream Commonly known as: Terazol 7   terconazole 0.8 % vaginal cream Commonly known as: TERAZOL 3     TAKE these medications   Accu-Chek FastClix Lancet Kit Test blood sugar 4 times daily. ICD-10 code: 024.419   Accu-Chek Guide Me w/Device Kit Use to test blood glucose level 4 times daily. ICD-10 code: 024.419   Accu-Chek Guide test strip Generic drug: glucose blood Use as instructed to test blood glucose level 4 times daily. ICD-10 code: 024.419   Accu-Chek Softclix Lancets lancets Use as instructed to check blood glucose 4 times daily.   aspirin 81 MG chewable tablet Chew 1 tablet (81 mg total) by mouth daily.   Blood Pressure Monitor Automat Devi 1 Device by Does not apply route daily. Automatic blood pressure cuff regular size. To monitor blood pressure regularly at home. ICD-10 code:Z34.90   [place] 1 Units by Does not apply route daily as needed.   fluconazole 150 MG tablet Commonly known as: DIFLUCAN Take 1 tablet now and 1 tablet in 3 days.   Gojji Weight Scale Misc 1 Device by Does not apply route daily as needed. To weight self daily as needed at home. ICD-10 code: Z34.90   PNV PO Take by mouth.      Julianne Handler, CNM  08/25/2019 10:46 PM

## 2019-09-05 ENCOUNTER — Encounter: Payer: Self-pay | Admitting: Obstetrics and Gynecology

## 2019-09-05 ENCOUNTER — Encounter: Payer: Self-pay | Admitting: General Practice

## 2019-09-05 ENCOUNTER — Other Ambulatory Visit: Payer: Self-pay

## 2019-09-05 ENCOUNTER — Inpatient Hospital Stay (HOSPITAL_COMMUNITY)
Admission: AD | Admit: 2019-09-05 | Discharge: 2019-09-05 | Disposition: A | Discharge status: Home / Self Care | Payer: Medicaid Other | Attending: Obstetrics and Gynecology | Admitting: Obstetrics and Gynecology

## 2019-09-05 ENCOUNTER — Ambulatory Visit (INDEPENDENT_AMBULATORY_CARE_PROVIDER_SITE_OTHER): Payer: Medicaid Other | Admitting: Obstetrics and Gynecology

## 2019-09-05 ENCOUNTER — Encounter (HOSPITAL_COMMUNITY): Payer: Self-pay | Admitting: Obstetrics and Gynecology

## 2019-09-05 ENCOUNTER — Other Ambulatory Visit (HOSPITAL_COMMUNITY)
Admission: RE | Admit: 2019-09-05 | Discharge: 2019-09-05 | Disposition: A | Discharge status: Home / Self Care | Payer: Medicaid Other | Source: Ambulatory Visit | Attending: Obstetrics and Gynecology | Admitting: Obstetrics and Gynecology

## 2019-09-05 VITALS — BP 112/65 | HR 102 | Temp 97.4°F | Wt 218.4 lb

## 2019-09-05 DIAGNOSIS — Z3480 Encounter for supervision of other normal pregnancy, unspecified trimester: Secondary | ICD-10-CM | POA: Diagnosis present

## 2019-09-05 DIAGNOSIS — Z3A36 36 weeks gestation of pregnancy: Secondary | ICD-10-CM | POA: Diagnosis not present

## 2019-09-05 DIAGNOSIS — Z0371 Encounter for suspected problem with amniotic cavity and membrane ruled out: Secondary | ICD-10-CM | POA: Diagnosis present

## 2019-09-05 DIAGNOSIS — O24410 Gestational diabetes mellitus in pregnancy, diet controlled: Secondary | ICD-10-CM

## 2019-09-05 DIAGNOSIS — Z3689 Encounter for other specified antenatal screening: Secondary | ICD-10-CM

## 2019-09-05 LAB — AMNISURE RUPTURE OF MEMBRANE (ROM) NOT AT ARMC: Amnisure ROM: NEGATIVE

## 2019-09-05 NOTE — MAU Note (Signed)
Office sent her in for eval.  ? Leaking since 1500 yesterday, clear fluid.  No bleeding.  Some mild cramping and pressure.

## 2019-09-05 NOTE — Discharge Instructions (Signed)
Fetal Movement Counts Patient Name: ________________________________________________ Patient Due Date: ____________________ What is a fetal movement count?  A fetal movement count is the number of times that you feel your baby move during a certain amount of time. This may also be called a fetal kick count. A fetal movement count is recommended for every pregnant woman. You may be asked to start counting fetal movements as early as week 28 of your pregnancy. Pay attention to when your baby is most active. You may notice your baby's sleep and wake cycles. You may also notice things that make your baby move more. You should do a fetal movement count:  When your baby is normally most active.  At the same time each day. A good time to count movements is while you are resting, after having something to eat and drink. How do I count fetal movements? 1. Find a quiet, comfortable area. Sit, or lie down on your side. 2. Write down the date, the start time and stop time, and the number of movements that you felt between those two times. Take this information with you to your health care visits. 3. Write down your start time when you feel the first movement. 4. Count kicks, flutters, swishes, rolls, and jabs. You should feel at least 10 movements. 5. You may stop counting after you have felt 10 movements, or if you have been counting for 2 hours. Write down the stop time. 6. If you do not feel 10 movements in 2 hours, contact your health care provider for further instructions. Your health care provider may want to do additional tests to assess your baby's well-being. Contact a health care provider if:  You feel fewer than 10 movements in 2 hours.  Your baby is not moving like he or she usually does. Date: ____________ Start time: ____________ Stop time: ____________ Movements: ____________ Date: ____________ Start time: ____________ Stop time: ____________ Movements: ____________ Date: ____________  Start time: ____________ Stop time: ____________ Movements: ____________ Date: ____________ Start time: ____________ Stop time: ____________ Movements: ____________ Date: ____________ Start time: ____________ Stop time: ____________ Movements: ____________ Date: ____________ Start time: ____________ Stop time: ____________ Movements: ____________ Date: ____________ Start time: ____________ Stop time: ____________ Movements: ____________ Date: ____________ Start time: ____________ Stop time: ____________ Movements: ____________ Date: ____________ Start time: ____________ Stop time: ____________ Movements: ____________ This information is not intended to replace advice given to you by your health care provider. Make sure you discuss any questions you have with your health care provider. Document Revised: 08/09/2018 Document Reviewed: 08/09/2018 Elsevier Patient Education  2020 Else[employer] Inc[employer]        Signs and Symptoms of Labor Labor is your body's natural process of moving your baby, placenta, and umbilical cord out of your uterus. The process of labor usually starts when your baby is full-term, between 37 and 40 weeks of pregnancy. How will I know when I am close to going into labor? As your body prepares for labor and the birth of your baby, you may notice the following symptoms in the weeks and days before true labor starts:  Having a strong desire to get your home ready to receive your new baby. This is called nesting. Nesting may be a sign that labor is approaching, and it may occur several weeks before birth. Nesting may involve cleaning and organizing your home.  Passing a small amount of thick, bloody mucus out of your vagina (normal bloody show or losing your mucus plug). This may happen more than a   week before labor begins, or it might occur right before labor begins as the opening of the cervix starts to widen (dilate). For some women, the entire mucus plug passes at once. For others,  smaller portions of the mucus plug may gradually pass over several days.  Your baby moving (dropping) lower in your pelvis to get into position for birth (lightening). When this happens, you may feel more pressure on your bladder and pelvic bone and less pressure on your ribs. This may make it easier to breathe. It may also cause you to need to urinate more often and have problems with bowel movements.  Having "practice contractions" (Braxton Hicks contractions) that occur at irregular (unevenly spaced) intervals that are more than 10 minutes apart. This is also called false labor. False labor contractions are common after exercise or sexual activity, and they will stop if you change position, rest, or drink fluids. These contractions are usually mild and do not get stronger over time. They may feel like: ? A backache or back pain. ? Mild cramps, similar to menstrual cramps. ? Tightening or pressure in your abdomen. Other early symptoms that labor may be starting soon include:  Nausea or loss of appetite.  Diarrhea.  Having a sudden burst of energy, or feeling very tired.  Mood changes.  Having trouble sleeping. How will I know when labor has begun? Signs that true labor has begun may include:  Having contractions that come at regular (evenly spaced) intervals and increase in intensity. This may feel like more intense tightening or pressure in your abdomen that moves to your back. ? Contractions may also feel like rhythmic pain in your upper thighs or back that comes and goes at regular intervals. ? For first-time mothers, this change in intensity of contractions often occurs at a more gradual pace. ? Women who have given birth before may notice a more rapid progression of contraction changes.  Having a feeling of pressure in the vaginal area.  Your water breaking (rupture of membranes). This is when the sac of fluid that surrounds your baby breaks. When this happens, you will notice  fluid leaking from your vagina. This may be clear or blood-tinged. Labor usually starts within 24 hours of your water breaking, but it may take longer to begin. ? Some women notice this as a gush of fluid. ? Others notice that their underwear repeatedly becomes damp. Follow these instructions at home:   When labor starts, or if your water breaks, call your health care provider or nurse care line. Based on your situation, they will determine when you should go in for an exam.  When you are in early labor, you may be able to rest and manage symptoms at home. Some strategies to try at home include: ? Breathing and relaxation techniques. ? Taking a warm bath or shower. ? Listening to music. ? Using a heating pad on the lower back for pain. If you are directed to use heat:  Place a towel between your skin and the heat source.  Leave the heat on for 20-30 minutes.  Remove the heat if your skin turns bright red. This is especially important if you are unable to feel pain, heat, or cold. You may have a greater risk of getting burned. Get help right away if:  You have painful, regular contractions that are 5 minutes apart or less.  Labor starts before you are [redacted] weeks along in your pregnancy.  You have a fever.  You have   a headache that does not go away.  You have bright red blood coming from your vagina.  You do not feel your baby moving.  You have a sudden onset of: ? Severe headache with vision problems. ? Nausea, vomiting, or diarrhea. ? Chest pain or shortness of breath. These symptoms may be an emergency. If your health care provider recommends that you go to the hospital or birth center where you plan to deliver, do not drive yourself. Have someone else drive you, or call emergency services (911 in the [place] Summary  Labor is your body's natural process of moving your baby, placenta, and umbilical cord out of your uterus.  The process of labor usually starts when your baby is  full-term, between 37 and 40 weeks of pregnancy.  When labor starts, or if your water breaks, call your health care provider or nurse care line. Based on your situation, they will determine when you should go in for an exam. This information is not intended to replace advice given to you by your health care provider. Make sure you discuss any questions you have with your health care provider. Document Revised: 09/19/2016 Document Reviewed: 05/27/2016 Elsevier Patient Education  2020 Elsevier Inc.  

## 2019-09-05 NOTE — MAU Provider Note (Signed)
First Provider Initiated Contact with Patient 09/05/19 1403       S: Ms. Pamela Schaefer is a 29 y.o. [id number] at [redacted]w[redacted]d  who presents to MAU today complaining of leaking of fluid since this morning. Leaking has not continued. She denies vaginal bleeding. She endorses contractions. She reports normal fetal movement.   Was in the office for OB visit today; SSE performed with positive pooling but unable to do fern slide so sent here for further evaluation.   O: BP 107/62 (BP Location: Right Arm)   Pulse 91   Temp 98.5 F (36.9 C) (Oral)   Resp 18   LMP 12/25/2018   SpO2 100%  GENERAL: Well-developed, well-nourished female in no acute distress.  HEAD: Normocephalic, atraumatic.  CHEST: Normal effort of breathing, regular heart rate ABDOMEN: Soft, nontender, gravid PELVIC: deferred, done in office  Cervical exam:  Dilation: Closed Effacement (%): Thick Cervical Position: Middle Exam by:: Judeth Horn NP   Fetal Monitoring: Baseline: 140 Variability: moderate Accelerations: 15x15 Decelerations: none Contractions: irregular  Results for orders placed or performed during the hospital encounter of 09/05/19 (from the past 24 hour(s))  Amnisure rupture of membrane (rom)not at [facility]     Status: None   Collection Time: 09/05/19  2:08 PM  Result Value Ref Range   Amnisure ROM NEGATIVE      A: SIUP at [redacted]w[redacted]d  Membranes intact  P: Discharge home  Judeth Horn, NP 09/05/2019 6:37 PM

## 2019-09-05 NOTE — Progress Notes (Signed)
   LOW-RISK PREGNANCY OFFICE VISIT Patient name: Pamela Schaefer MRN [id number]  Date of birth: 12-13-1990 Chief Complaint:   Routine Prenatal Visit  History of Present Illness:   Pamela Schaefer is a 29 y.o. [id number] female at [redacted]w[redacted]d with an Estimated Date of Delivery: 10/01/19 being seen today for ongoing management of a low-risk pregnancy.  Today she reports a gush of fluid after shower and has continued to leak fluid on pantiliner. Contractions: Irregular. Vag. Bleeding: None.  Movement: Present. Reports leaking of fluid since yesterday at 1500. Review of Systems:   Pertinent items are noted in HPI Denies abnormal vaginal discharge w/ itching/odor/irritation, headaches, visual changes, shortness of breath, chest pain, abdominal pain, severe nausea/vomiting, or problems with urination or bowel movements unless otherwise stated above. Pertinent History Reviewed:  Reviewed past medical,surgical, social, obstetrical and family history.  Reviewed problem list, medications and allergies. Physical Assessment:   Vitals:   09/05/19 1121  BP: 112/65  Pulse: (!) 102  Temp: (!) 97.4 F (36.3 C)  Weight: 218 lb 6.4 oz (99.1 kg)  Body mass index is 38.69 kg/m.        Physical Examination:   General appearance: Well appearing, and in no distress  Mental status: Alert, oriented to person, place, and time  Skin: Warm & dry  Cardiovascular: Normal heart rate noted  Respiratory: Normal respiratory effort, no distress  Abdomen: Soft, gravid, nontender  Pelvic: Cervical exam deferred -- speculum exam with (+) pooling     Extremities: Edema: None  Fetal Status: Fetal Heart Rate (bpm): 150 Fundal Height: 41 cm Movement: Present    No results found for this or any previous visit (from the past 24 hour(s)).  Assessment & Plan:  1) Low-risk pregnancy [id number] at [redacted]w[redacted]d with an Estimated Date of Delivery: 10/01/19   2) Supervision of other normal pregnancy, antepartum  - Culture, beta strep (group  b only),  - Cervicovaginal ancillary only( [place])  3) [redacted] weeks gestation of pregnancy  - Advised that if PPROM she will not be able to have waterbirth that she desires  4) Diet controlled gestational diabetes mellitus (GDM) in third trimester - Review of Blood Sugar Levels: FBS range = 74-92 mg/dL  2 hr PP range = 50-093; 129 and 158 (unable to recall dietary)    Meds: No orders of the defined types were placed in this encounter.  Labs/procedures today: GBS and wet Prep  Plan:  Continue routine obstetrical care   Reviewed: Preterm labor symptoms and general obstetric precautions including but not limited to vaginal bleeding, contractions, leaking of fluid and fetal movement were reviewed in detail with the patient.  All questions were answered. Has home bp cuff.  Check bp weekly, let us know if >140/90.   Follow-up: Return in about 1 week (around 09/12/2019) for Return OB - My Chart video.  Orders Placed This Encounter  Procedures  . Culture, beta strep (group b only)   Raelyn Mora MSN, CNM 09/05/2019 11:29 AM

## 2019-09-06 ENCOUNTER — Other Ambulatory Visit: Payer: Self-pay | Admitting: Obstetrics and Gynecology

## 2019-09-06 DIAGNOSIS — B3731 Acute candidiasis of vulva and vagina: Secondary | ICD-10-CM

## 2019-09-06 LAB — CERVICOVAGINAL ANCILLARY ONLY
Bacterial Vaginitis (gardnerella): NEGATIVE
Candida Glabrata: NEGATIVE
Candida Vaginitis: POSITIVE — AB
Chlamydia: NEGATIVE
Comment: NEGATIVE
Comment: NEGATIVE
Comment: NEGATIVE
Comment: NEGATIVE
Comment: NEGATIVE
Comment: NORMAL
Neisseria Gonorrhea: NEGATIVE
Trichomonas: NEGATIVE

## 2019-09-06 MED ORDER — TERCONAZOLE 0.4 % VA CREA: 1.0000 | TOPICAL_CREAM | Freq: Every day | VAGINAL | 0 refills | Status: AC

## 2019-09-06 NOTE — Progress Notes (Signed)
Notified via My Chart

## 2019-09-09 LAB — CULTURE, BETA STREP (GROUP B ONLY): Strep Gp B Culture: NEGATIVE

## 2019-09-11 ENCOUNTER — Ambulatory Visit: Payer: Medicaid Other | Attending: Obstetrics

## 2019-09-11 ENCOUNTER — Ambulatory Visit: Payer: Medicaid Other | Admitting: *Deleted

## 2019-09-11 ENCOUNTER — Other Ambulatory Visit: Payer: Self-pay

## 2019-09-11 ENCOUNTER — Ambulatory Visit: Payer: Medicaid Other

## 2019-09-11 DIAGNOSIS — O09293 Supervision of pregnancy with other poor reproductive or obstetric history, third trimester: Secondary | ICD-10-CM

## 2019-09-11 DIAGNOSIS — O24410 Gestational diabetes mellitus in pregnancy, diet controlled: Secondary | ICD-10-CM

## 2019-09-11 DIAGNOSIS — Z880 Allergy status to penicillin: Secondary | ICD-10-CM | POA: Insufficient documentation

## 2019-09-11 DIAGNOSIS — Z362 Encounter for other antenatal screening follow-up: Secondary | ICD-10-CM

## 2019-09-11 DIAGNOSIS — Z3480 Encounter for supervision of other normal pregnancy, unspecified trimester: Secondary | ICD-10-CM

## 2019-09-11 DIAGNOSIS — E669 Obesity, unspecified: Secondary | ICD-10-CM

## 2019-09-11 DIAGNOSIS — O99212 Obesity complicating pregnancy, second trimester: Secondary | ICD-10-CM | POA: Diagnosis not present

## 2019-09-11 DIAGNOSIS — Z3A37 37 weeks gestation of pregnancy: Secondary | ICD-10-CM

## 2019-09-11 DIAGNOSIS — Z148 Genetic carrier of other disease: Secondary | ICD-10-CM

## 2019-09-11 DIAGNOSIS — O24419 Gestational diabetes mellitus in pregnancy, unspecified control: Secondary | ICD-10-CM

## 2019-09-12 ENCOUNTER — Telehealth (INDEPENDENT_AMBULATORY_CARE_PROVIDER_SITE_OTHER): Payer: Medicaid Other | Admitting: Obstetrics and Gynecology

## 2019-09-12 ENCOUNTER — Encounter: Payer: Self-pay | Admitting: Obstetrics and Gynecology

## 2019-09-12 VITALS — Wt 218.0 lb

## 2019-09-12 DIAGNOSIS — Z3A37 37 weeks gestation of pregnancy: Secondary | ICD-10-CM

## 2019-09-12 DIAGNOSIS — K219 Gastro-esophageal reflux disease without esophagitis: Secondary | ICD-10-CM

## 2019-09-12 DIAGNOSIS — O99213 Obesity complicating pregnancy, third trimester: Secondary | ICD-10-CM

## 2019-09-12 DIAGNOSIS — E669 Obesity, unspecified: Secondary | ICD-10-CM

## 2019-09-12 DIAGNOSIS — Z8759 Personal history of other complications of pregnancy, childbirth and the puerperium: Secondary | ICD-10-CM

## 2019-09-12 DIAGNOSIS — Z3480 Encounter for supervision of other normal pregnancy, unspecified trimester: Secondary | ICD-10-CM

## 2019-09-12 DIAGNOSIS — O24410 Gestational diabetes mellitus in pregnancy, diet controlled: Secondary | ICD-10-CM

## 2019-09-12 DIAGNOSIS — F419 Anxiety disorder, unspecified: Secondary | ICD-10-CM

## 2019-09-12 DIAGNOSIS — O26893 Other specified pregnancy related conditions, third trimester: Secondary | ICD-10-CM

## 2019-09-12 DIAGNOSIS — O99343 Other mental disorders complicating pregnancy, third trimester: Secondary | ICD-10-CM

## 2019-09-14 NOTE — Progress Notes (Signed)
   MY CHART VIDEO VIRTUAL OBSTETRICS VISIT ENCOUNTER NOTE  I connected with Pamela Schaefer on 09/12/19 at  4:10 PM EDT by My Chart video at home and verified that I am speaking with the correct person using two identifiers. Provider located at [facility] [employer] for [employer] at [place].   I discussed the limitations, risks, security and privacy concerns of performing an evaluation and management service by My Chart video and the availability of in person appointments. I also discussed with the patient that there may be a patient responsible charge related to this service. The patient expressed understanding and agreed to proceed.  Subjective:  Pamela Schaefer is a 30 y.o. [id number] at [redacted]w[redacted]d being followed for ongoing prenatal care.  She is currently monitored for the following issues for this high-risk pregnancy and has GERD without esophagitis; Anxiety; Acute appendicitis; Intrauterine pregnancy; Supervision of other normal pregnancy, antepartum; Penicillin allergy; Obesity affecting pregnancy in second trimester; History of gestational hypertension; and Gestational diabetes on their problem list.  Patient reports she did not pick up the Terazol cream that was Rx'd. She reports that she took some Diflucan tablets she already had at home. She denies vaginal d/c or irritation. She has not taken any FBS. She reports her 2 hr PP range is 94-130. Reports fetal movement. Denies any contractions, bleeding or leaking of fluid.   The following portions of the patient's history were reviewed and updated as appropriate: allergies, current medications, past family history, past medical history, past social history, past surgical history and problem list.   Objective:   General:  Alert, oriented and cooperative.   Mental Status: Normal mood and affect perceived. Normal judgment and thought content.  Rest of physical exam deferred due to type of encounter  Wt 218 lb (98.9 kg)   LMP 12/25/2018   BMI  38.62 kg/m  **Done by patient's own at home BP cuff and scale  Assessment and Plan:  Pregnancy: [id number] at [redacted]w[redacted]d  1. Supervision of other normal pregnancy, antepartum - In-office visits only - Planning waterbirth  2. Diet controlled gestational diabetes mellitus (GDM) in third trimester - Advised of the importance of taking FBS and 2 hr PP BS daily - Advised to bring BS log with her to next appt  3. [redacted] weeks gestation of pregnancy   Term labor symptoms and general obstetric precautions including but not limited to vaginal bleeding, contractions, leaking of fluid and fetal movement were reviewed in detail with the patient.  I discussed the assessment and treatment plan with the patient. The patient was provided an opportunity to ask questions and all were answered. The patient agreed with the plan and demonstrated an understanding of the instructions. The patient was advised to call back or seek an in-person office evaluation/go to [facility]U at [facility] for any urgent or concerning symptoms. Please refer to After Visit Summary for other counseling recommendations.   I provided 10 minutes of non-face-to-face time during this encounter. There was 5 minutes of chart review time spent prior to this encounter. Total time spent = 15 minutes.  Return in about 1 week (around 09/19/2019) for Return OB visit.  Future Appointments  Date Time Provider Department Center  09/19/2019 10:30 AM Raelyn Mora, CNM [facility] None    Raelyn Mora, CNM Center for [employer], [facility]

## 2019-09-19 ENCOUNTER — Encounter: Payer: Medicaid Other | Admitting: Obstetrics and Gynecology

## 2019-09-25 ENCOUNTER — Encounter: Payer: Self-pay | Admitting: Obstetrics and Gynecology

## 2019-09-25 ENCOUNTER — Ambulatory Visit (INDEPENDENT_AMBULATORY_CARE_PROVIDER_SITE_OTHER): Payer: Medicaid Other | Admitting: Obstetrics and Gynecology

## 2019-09-25 ENCOUNTER — Other Ambulatory Visit: Payer: Self-pay

## 2019-09-25 VITALS — BP 100/62 | HR 94 | Temp 98.3°F | Wt 220.8 lb

## 2019-09-25 DIAGNOSIS — O24410 Gestational diabetes mellitus in pregnancy, diet controlled: Secondary | ICD-10-CM

## 2019-09-25 DIAGNOSIS — O26843 Uterine size-date discrepancy, third trimester: Secondary | ICD-10-CM

## 2019-09-25 DIAGNOSIS — Z3480 Encounter for supervision of other normal pregnancy, unspecified trimester: Secondary | ICD-10-CM

## 2019-09-25 DIAGNOSIS — Z3A39 39 weeks gestation of pregnancy: Secondary | ICD-10-CM

## 2019-09-25 NOTE — Progress Notes (Signed)
  HIGH-RISK PREGNANCY OFFICE VISIT Patient name: Pamela Schaefer MRN [id number]  Date of birth: [birth year] Chief Complaint:   Routine Prenatal Visit  History of Present Illness:   Pamela Schaefer is a 29 y.o. [id number] female at [redacted]w[redacted]d with an Estimated Date of Delivery: 10/01/19 being seen today for ongoing management of a high-risk pregnancy complicated by A1DM and H/O gHTN Today she reports occasional contractions. Contractions: Irregular. Vag. Bleeding: None.  Movement: Present. denies leaking of fluid.  Review of Systems:   Pertinent items are noted in HPI Denies abnormal vaginal discharge w/ itching/odor/irritation, headaches, visual changes, shortness of breath, chest pain, abdominal pain, severe nausea/vomiting, or problems with urination or bowel movements unless otherwise stated above. Pertinent History Reviewed:  Reviewed past medical,surgical, social, obstetrical and family history.  Reviewed problem list, medications and allergies. Physical Assessment:   Vitals:   09/25/19 1137  BP: 100/62  Pulse: 94  Temp: 98.3 F (36.8 C)  Weight: 220 lb 12.8 oz (100.2 kg)  Body mass index is 39.11 kg/m.           Physical Examination:   General appearance: alert, well appearing, and in no distress and oriented to person, place, and time  Mental status: alert, oriented to person, place, and time, normal mood, behavior, speech, dress, motor activity, and thought processes  Skin: warm & dry   Extremities: Edema: None    Cardiovascular: normal heart rate noted  Respiratory: normal respiratory effort, no distress  Abdomen: gravid, soft, non-tender  Pelvic: Cervical exam performed  Dilation: Closed Effacement (%): Thick Station: -3  Fetal Status: Fetal Heart Rate (bpm): 152 Fundal Height: 43 cm Movement: Present Presentation: Vertex  Fetal Surveillance Testing today: none   No results found for this or any previous visit (from the past 24 hour(s)).  Assessment & Plan:  1) High-risk  pregnancy [id number] at [redacted]w[redacted]d with an Estimated Date of Delivery: 10/01/19   2) Supervision of other normal pregnancy, antepartum - Discussed labor plans - Advised that if she stays at home laboring too long, she could miss her opportunity to have a waterbirth because of COVID test TAT  3) Diet controlled gestational diabetes mellitus (GDM) in third trimester - Review of Blood Sugar Levels: FBS range = 75-103 mg/dL  2 hr PP range = 97-948  4) Uterine size date discrepancy pregnancy, third trimester  5) [redacted] weeks gestation of pregnancy   Meds: No orders of the defined types were placed in this encounter.   Labs/procedures today: cervical exam  Treatment Plan:  NST next week  Reviewed: Term labor symptoms and general obstetric precautions including but not limited to vaginal bleeding, contractions, leaking of fluid and fetal movement were reviewed in detail with the patient.  All questions were answered. Has home bp cuff. Check bp weekly, let us know if >140/90.   Follow-up: Return in about 1 week (around 10/02/2019) for Return OB visit, NST.  No orders of the defined types were placed in this encounter.  Raelyn Mora MSN, CNM 09/25/2019 12:05 PM

## 2019-10-02 ENCOUNTER — Encounter (HOSPITAL_COMMUNITY): Payer: Self-pay | Admitting: Obstetrics and Gynecology

## 2019-10-02 ENCOUNTER — Ambulatory Visit (INDEPENDENT_AMBULATORY_CARE_PROVIDER_SITE_OTHER): Payer: Medicaid Other

## 2019-10-02 ENCOUNTER — Other Ambulatory Visit: Payer: Self-pay

## 2019-10-02 ENCOUNTER — Inpatient Hospital Stay (HOSPITAL_COMMUNITY)
Admission: AD | Admit: 2019-10-02 | Discharge: 2019-10-04 | DRG: 807 | Disposition: A | Discharge status: Home / Self Care | Payer: Medicaid Other | Attending: Family Medicine | Admitting: Family Medicine

## 2019-10-02 VITALS — BP 104/68 | HR 89 | Temp 98.2°F | Wt 222.8 lb

## 2019-10-02 DIAGNOSIS — Z20822 Contact with and (suspected) exposure to covid-19: Secondary | ICD-10-CM | POA: Diagnosis present

## 2019-10-02 DIAGNOSIS — O9229 Other disorders of breast associated with pregnancy and the puerperium: Secondary | ICD-10-CM | POA: Diagnosis present

## 2019-10-02 DIAGNOSIS — Z3480 Encounter for supervision of other normal pregnancy, unspecified trimester: Secondary | ICD-10-CM

## 2019-10-02 DIAGNOSIS — O99213 Obesity complicating pregnancy, third trimester: Secondary | ICD-10-CM

## 2019-10-02 DIAGNOSIS — Z3A40 40 weeks gestation of pregnancy: Secondary | ICD-10-CM

## 2019-10-02 DIAGNOSIS — O9902 Anemia complicating childbirth: Secondary | ICD-10-CM | POA: Diagnosis present

## 2019-10-02 DIAGNOSIS — Z880 Allergy status to penicillin: Secondary | ICD-10-CM | POA: Diagnosis not present

## 2019-10-02 DIAGNOSIS — O24410 Gestational diabetes mellitus in pregnancy, diet controlled: Secondary | ICD-10-CM

## 2019-10-02 DIAGNOSIS — F419 Anxiety disorder, unspecified: Secondary | ICD-10-CM | POA: Diagnosis present

## 2019-10-02 DIAGNOSIS — D563 Thalassemia minor: Secondary | ICD-10-CM | POA: Diagnosis present

## 2019-10-02 DIAGNOSIS — O24420 Gestational diabetes mellitus in childbirth, diet controlled: Secondary | ICD-10-CM | POA: Diagnosis present

## 2019-10-02 DIAGNOSIS — E669 Obesity, unspecified: Secondary | ICD-10-CM

## 2019-10-02 DIAGNOSIS — Z87891 Personal history of nicotine dependence: Secondary | ICD-10-CM

## 2019-10-02 DIAGNOSIS — O24419 Gestational diabetes mellitus in pregnancy, unspecified control: Secondary | ICD-10-CM | POA: Diagnosis present

## 2019-10-02 LAB — RESPIRATORY PANEL BY RT PCR (FLU A&B, COVID)
Influenza A by PCR: NEGATIVE
Influenza B by PCR: NEGATIVE
SARS Coronavirus 2 by RT PCR: NEGATIVE

## 2019-10-02 LAB — TYPE AND SCREEN
ABO/RH(D): O POS
Antibody Screen: NEGATIVE

## 2019-10-02 LAB — CBC
HCT: 26.6 % — ABNORMAL LOW (ref 36.0–46.0)
Hemoglobin: 8.4 g/dL — ABNORMAL LOW (ref 12.0–15.0)
MCH: 28.9 pg (ref 26.0–34.0)
MCHC: 31.6 g/dL (ref 30.0–36.0)
MCV: 91.4 fL (ref 80.0–100.0)
Platelets: 273 10*3/uL (ref 150–400)
RBC: 2.91 MIL/uL — ABNORMAL LOW (ref 3.87–5.11)
RDW: 14.9 % (ref 11.5–15.5)
WBC: 5.6 10*3/uL (ref 4.0–10.5)
nRBC: 0 % (ref 0.0–0.2)

## 2019-10-02 LAB — GLUCOSE, CAPILLARY
Glucose-Capillary: 76 mg/dL (ref 70–99)
Glucose-Capillary: 81 mg/dL (ref 70–99)

## 2019-10-02 MED ORDER — LACTATED RINGERS IV SOLN: INTRAVENOUS | Status: DC

## 2019-10-02 MED ORDER — LIDOCAINE HCL (PF) 1 % IJ SOLN: 30.0000 mL | INTRAMUSCULAR | Status: DC | PRN

## 2019-10-02 MED ORDER — OXYCODONE-ACETAMINOPHEN 5-325 MG PO TABS
2.0000 | ORAL_TABLET | ORAL | Status: DC | PRN
  Administered 2019-10-02: 2 via ORAL
  Filled 2019-10-02: qty 2

## 2019-10-02 MED ORDER — ACETAMINOPHEN 325 MG PO TABS
650.0000 mg | ORAL_TABLET | ORAL | Status: DC | PRN
  Administered 2019-10-02: 650 mg via ORAL
  Filled 2019-10-02: qty 2

## 2019-10-02 MED ORDER — ONDANSETRON HCL 4 MG/2ML IJ SOLN: 4.0000 mg | Freq: Four times a day (QID) | INTRAMUSCULAR | Status: DC | PRN

## 2019-10-02 MED ORDER — LACTATED RINGERS IV SOLN: 500.0000 mL | INTRAVENOUS | Status: DC | PRN

## 2019-10-02 MED ORDER — TERBUTALINE SULFATE 1 MG/ML IJ SOLN: 0.2500 mg | Freq: Once | INTRAMUSCULAR | Status: DC | PRN

## 2019-10-02 MED ORDER — OXYTOCIN-SODIUM CHLORIDE 30-0.9 UT/500ML-% IV SOLN
2.5000 [IU]/h | INTRAVENOUS | Status: DC
  Filled 2019-10-02: qty 500

## 2019-10-02 MED ORDER — MISOPROSTOL 50MCG HALF TABLET
50.0000 ug | ORAL_TABLET | ORAL | Status: DC | PRN
  Administered 2019-10-02: 50 ug via BUCCAL
  Filled 2019-10-02: qty 1

## 2019-10-02 MED ORDER — OXYTOCIN BOLUS FROM INFUSION
333.0000 mL | Freq: Once | INTRAVENOUS | Status: AC
  Administered 2019-10-02: 333 mL via INTRAVENOUS

## 2019-10-02 MED ORDER — SOD CITRATE-CITRIC ACID 500-334 MG/5ML PO SOLN: 30.0000 mL | ORAL | Status: DC | PRN

## 2019-10-02 MED ORDER — FENTANYL CITRATE (PF) 100 MCG/2ML IJ SOLN
100.0000 ug | INTRAMUSCULAR | Status: DC | PRN
  Administered 2019-10-02 (×2): 100 ug via INTRAVENOUS
  Filled 2019-10-02 (×2): qty 2

## 2019-10-02 MED ORDER — OXYTOCIN-SODIUM CHLORIDE 30-0.9 UT/500ML-% IV SOLN
1.0000 m[IU]/min | INTRAVENOUS | Status: DC
  Administered 2019-10-02: 2 m[IU]/min via INTRAVENOUS

## 2019-10-02 MED ORDER — OXYCODONE-ACETAMINOPHEN 5-325 MG PO TABS: 1.0000 | ORAL_TABLET | ORAL | Status: DC | PRN

## 2019-10-02 NOTE — Progress Notes (Signed)
Pamela Schaefer is a 29 y.o. R4Y7062 at [redacted]w[redacted]d by LMP admitted for NRNST.  Subjective: Called to bedside by RN for repetitive deep variables down to 70s. RN had adjusted positioning which helped variables but variability became minimal.   Objective: BP 104/62   Pulse 79   Temp 99 F (37.2 C) (Oral)   Resp 18   Ht 5\' 3"  (1.6 m)   Wt 222 lb 12.8 oz (101.1 kg)   LMP 12/25/2018   BMI 39.47 kg/m  No intake/output data recorded. No intake/output data recorded.  FHT:  FHR: 135 bpm, variability: minimal ,  accelerations:  Present,  decelerations:  Present occasional variable UC:   regular, every 5-6 minutes SVE:   Dilation: 1.5 Effacement (%): 50 Station: -3 Exam by:: [email or web address] CNM  After cervical exam and repositioning: Fetal Tracing: reassuring Baseline: 135 Variability: minimal - moderate Accelerations: present Decelerations: none Toco: irregular q 5-6min  Labs: Lab Results  Component Value Date   WBC 5.6 10/02/2019   HGB 8.4 (L) 10/02/2019   HCT 26.6 (L) 10/02/2019   MCV 91.4 10/02/2019   PLT 273 10/02/2019    Assessment / Plan: IOL for NRNST, latent labor  Labor: Latent labor Preeclampsia:  no signs or symptoms of toxicity Fetal Wellbeing:  Category II , improving and MD aware Pain Control:  Labor support without medications I/D:  n/a Anticipated MOD:  NSVD  Harbor Paster R Santiago Stenzel 10/02/2019, 1600

## 2019-10-02 NOTE — Progress Notes (Signed)
   PRENATAL VISIT NOTE  Subjective:  Pamela Schaefer is a 29 y.o. [id number] at [redacted]w[redacted]d being seen today for ongoing prenatal care.  She is currently monitored for the following issues for this high-risk pregnancy and has GERD without esophagitis; Anxiety; Acute appendicitis; Intrauterine pregnancy; Supervision of other normal pregnancy, antepartum; Penicillin allergy; Obesity affecting pregnancy in second trimester; History of gestational hypertension; and Gestational diabetes on their problem list.  Patient reports contractions since midnight.  Contractions: Irregular. Vag. Bleeding: None.  Movement: Present. Denies leaking of fluid.   The following portions of the patient's history were reviewed and updated as appropriate: allergies, current medications, past family history, past medical history, past social history, past surgical history and problem list.   Objective:   Vitals:   10/02/19 0944  BP: 104/68  Pulse: 89  Temp: 98.2 F (36.8 C)  Weight: 222 lb 12.8 oz (101.1 kg)    Fetal Status: Fetal Heart Rate (bpm): 151 Fundal Height: 44 cm Movement: Present     General:  Alert, oriented and cooperative. Patient is in no acute distress.  Skin: Skin is warm and dry. No rash noted.   Cardiovascular: Normal heart rate noted  Respiratory: Normal respiratory effort, no problems with respiration noted  Abdomen: Soft, gravid, appropriate for gestational age.  Pain/Pressure: Present     Pelvic: Cervical exam performed in the presence of a chaperone Dilation: 1.5 Effacement (%): 50 Station: -3  Extremities: Normal range of motion.  Edema: None  Mental Status: Normal mood and affect. Normal behavior. Normal judgment and thought content.   Assessment and Plan:  Pregnancy: [id number] at [redacted]w[redacted]d 1. Supervision of other normal pregnancy, antepartum -NST: baseline 140, moderate variability, no accels, one variable -Recommended patient proceed to labor and delivery for IOL, cervix favorable  2. Diet  controlled gestational diabetes mellitus (GDM) in third trimester   Term labor symptoms and general obstetric precautions including but not limited to vaginal bleeding, contractions, leaking of fluid and fetal movement were reviewed in detail with the patient. Please refer to After Visit Summary for other counseling recommendations.   No follow-ups on file.  Future Appointments  Date Time Provider Department Center  11/14/2019  8:30 AM Raelyn Mora, CNM [facility] None    Rolm Bookbinder, [place]  10/02/19 10:36 AM

## 2019-10-02 NOTE — Discharge Summary (Signed)
Postpartum Discharge Summary  Date of Service updated 10/03/19    Patient Name: Pamela Schaefer DOB: [birth year] MRN: [id number]  Date of admission: 10/02/2019 Delivery date:10/02/2019  Delivering provider: Randa Ngo  Date of discharge: 10/04/2019  Admitting diagnosis: Indication for care in labor or delivery [O75.9] Intrauterine pregnancy: [redacted]w[redacted]d    Secondary diagnosis:  Principal Problem:   Vaginal delivery Active Problems:   Anxiety   Gestational diabetes   Indication for care in labor or delivery  Additional problems: as noted above  Discharge diagnosis: Vaginal delivery                                            Post partum procedures:none Augmentation: Pitocin and Cytotec Complications: None  Hospital course: Induction of Labor With Vaginal Delivery   29y.o. yo [id number] [id number] admitted to the hospital 10/02/2019 for induction of labor.  Indication for induction: Non-reactive NST in setting of A1 DM.  Patient had an uncomplicated labor course as follows: Membrane Rupture Time/Date: 1:50 PM ,10/02/2019   Delivery Method:Vaginal, Spontaneous  Episiotomy: None  Lacerations:  None  Details of delivery can be found in separate delivery note.  Patient had a routine postpartum course. Patient is discharged home 10/04/19.  Newborn Data: Birth date:10/02/2019  Birth time:10:09 PM  Gender:Female  Living status:Living  Apgars:7 ,9  Weight:3805 g   Magnesium Sulfate received: No BMZ received: No Rhophylac:N/A MMR:N/A T-DaP:declined prenatally Flu: No Transfusion:No  Physical exam  Vitals:   10/03/19 0505 10/03/19 0838 10/03/19 1300 10/04/19 0600  BP: 101/63 113/71 111/66 114/71  Pulse: 79 71 78 71  Resp: _0 Temp: 98.5 F (36.9 C) 98.4 F (36.9 C) 98.4 F (36.9 C) 99.1 F (37.3 C)  TempSrc: Oral Oral Oral Oral  SpO2: 98% 100% 100% 99%  Weight:      Height:       General: alert, cooperative and no distress  HEENT: moist mucous  membranes Resp: normal WOB Uterine Fundus: firm, non-tender Extremities: no LE edema Skin: warm and dry  Labs: Lab Results  Component Value Date   WBC 5.6 10/02/2019   HGB 8.4 (L) 10/02/2019   HCT 26.6 (L) 10/02/2019   MCV 91.4 10/02/2019   PLT 273 10/02/2019   CMP Latest Ref Rng & Units 08/25/2019  Glucose 70 - 99 mg/dL 89  BUN 6 - 20 mg/dL 5(L)  Creatinine 0.44 - 1.00 mg/dL 0.66  Sodium 135 - 145 mmol/L 137  Potassium 3.5 - 5.1 mmol/L 3.2(L)  Chloride 98 - 111 mmol/L 104  CO2 22 - 32 mmol/L 23  Calcium 8.9 - 10.3 mg/dL 8.8(L)  Total Protein 6.5 - 8.1 g/dL 5.7(L)  Total Bilirubin 0.3 - 1.2 mg/dL 0.7  Alkaline Phos 38 - 126 U/L 69  AST 15 - 41 U/L 15  ALT 0 - 44 U/L 9   Edinburgh Score: No flowsheet data found.   After visit meds:  Allergies as of 10/04/2019      Reactions   Penicillins Itching      Medication List    STOP taking these medications   Accu-Chek FastClix Lancet Kit   Accu-Chek Guide Me w/Device Kit   Accu-Chek Guide test strip Generic drug: glucose blood   Accu-Chek Softclix Lancets lancets   aspirin 81 MG chewable tablet   Blood Pressure Monitor Automat Devi  Gojji Weight Scale Misc     TAKE these medications   acetaminophen 325 MG tablet Commonly known as: Tylenol Take 2 tablets (650 mg total) by mouth every 6 (six) hours as needed for mild pain or moderate pain.   coconut oil Oil Apply 1 application topically as needed (nipple pain).   ibuprofen 600 MG tablet Commonly known as: ADVIL Take 1 tablet (600 mg total) by mouth every 8 (eight) hours as needed for moderate pain or cramping.   PNV PO Take by mouth.        Discharge home in stable condition Infant Feeding: breast Infant Disposition:home with mother Discharge instruction: per After Visit Summary and Postpartum booklet. Activity: Advance as tolerated. Pelvic rest for 6 weeks.  Diet: routine diet Future Appointments: Future Appointments  Date Time Provider  [place]  11/14/2019  8:30 AM Laury Deep, CNM [facility] None   Follow up Visit: Please schedule this patient for a In person postpartum visit in 4 weeks with the following provider: Any provider. Additional Postpartum F/U:2 hour GTT in 4-6 weeks High risk pregnancy complicated by: W2BJS Delivery mode:  Vaginal, Spontaneous  Anticipated Birth Control:  undecided, but wants birth control outpatient, given resources  10/04/2019 Randa Ngo, MD

## 2019-10-02 NOTE — H&P (Signed)
OBSTETRIC ADMISSION HISTORY AND PHYSICAL  Pamela Schaefer is a 29 y.o. female [phone] with IUP at 40w1dby LMP presenting due to non-reassuring NST in the office this morning. She otherwise reports +FMs, No LOF, no VB, no blurry vision, headaches or peripheral edema, and RUQ pain.  She plans on breast/formula feeding. She request depo and then BTL for birth control. [place]   Desires water birth if possible.   Dating: By LMP --->  Estimated Date of Delivery: 10/01/19  Sono:    '@[redacted]w[redacted]d' , CWD, normal anatomy, cephalic presentation, 23220U 36% EFW   Prenatal History/Complications:  - obesity (BMI 39.47) - silent carrier alpha-thalassemia  - Hx of gestational HTN in prior pregnancy  - diet controlled gestational diabetes (A1GDM)  Past Medical History: Past Medical History:  Diagnosis Date  . Gestational diabetes 07/17/2019  . PONV (postoperative nausea and vomiting)   . Pregnancy induced hypertension    #4    Past Surgical History: Past Surgical History:  Procedure Laterality Date  . CLAVICLE SURGERY    . INDUCED ABORTION    . LAPAROSCOPIC APPENDECTOMY N/A 10/25/2014   Procedure: APPENDECTOMY LAPAROSCOPIC;  Surgeon: EGreer Pickerel MD;  [facility]  Service: General;  Laterality: N/A;    Obstetrical History: OB History    Gravida  7   Para  4   Term  4   Preterm  0   AB  2   Living  4     SAB  1   TAB  1   Ectopic  0   Multiple  0   Live Births  4        Obstetric Comments  Pre E w/#4        Social History Social History   Socioeconomic History  . Marital status: Single    Spouse name: Not on file  . Number of children: 4  . Years of education: Not on file  . Highest education level: GED or equivalent  Occupational History  . Occupation: pPensions consultantand gamble  Tobacco Use  . Smoking status: Former Smoker    Packs/day: 0.25    Types: Cigarettes  . Smokeless tobacco: Former UNetwork engineer .  Vaping Use: Never used  Substance and Sexual Activity  . Alcohol use: Not Currently    Alcohol/week: 0.0 standard drinks    Comment: social  . Drug use: No  . Sexual activity: Yes    Partners: Male    Birth control/protection: None  Other Topics Concern  . Not on file  Social History Narrative  . Not on file   Social Determinants of Health   Financial Resource Strain:   . Difficulty of Paying Living Expenses: Not on file  Food Insecurity: No Food Insecurity  . Worried About RCharity fundraiserin the Last Year: Never true  . Ran Out of Food in the Last Year: Never true  Transportation Needs: No Transportation Needs  . Lack of Transportation (Medical): No  . Lack of Transportation (Non-Medical): No  Physical Activity: Inactive  . Days of Exercise per Week: 0 days  . Minutes of Exercise per Session: 0 min  Stress: Stress Concern Present  . Feeling of Stress : Very much  Social Connections:   . Frequency of Communication with Friends and Family: Not on file  . Frequency of Social Gatherings with Friends and Family: Not on file  . Attends Religious Services: Not on file  . Active  Member of Clubs or Organizations: Not on file  . Attends Archivist Meetings: Not on file  . Marital Status: Not on file    Family History: Family History  Problem Relation Age of Onset  . Heart disease Maternal Grandfather   . Kidney disease Maternal Grandfather        failure  . Healthy Mother   . Heart disease Father   . Diabetes Maternal Grandmother   . Hypertension Maternal Grandmother   . Colon cancer Neg Hx   . Stomach cancer Neg Hx   . Rectal cancer Neg Hx   . Esophageal cancer Neg Hx   . Liver cancer Neg Hx     Allergies: Allergies  Allergen Reactions  . Penicillins Itching    Medications Prior to Admission  Medication Sig Dispense Refill Last Dose  . Accu-Chek Softclix Lancets lancets Use as instructed to check blood glucose 4 times daily. 100 each 12   .  aspirin 81 MG chewable tablet Chew 1 tablet (81 mg total) by mouth daily. 30 tablet 5   . Blood Glucose Monitoring Suppl (ACCU-CHEK GUIDE ME) w/Device KIT Use to test blood glucose level 4 times daily. ICD-10 code: 024.419 1 kit 0   . Blood Pressure Monitoring (BLOOD PRESSURE MONITOR AUTOMAT) DEVI 1 Device by Does not apply route daily. Automatic blood pressure cuff regular size. To monitor blood pressure regularly at home. ICD-10 code:Z34.90 1 each 0   . glucose blood (ACCU-CHEK GUIDE) test strip Use as instructed to test blood glucose level 4 times daily. ICD-10 code: 024.419 100 each 12   . Lancets Misc. (ACCU-CHEK FASTCLIX LANCET) KIT Test blood sugar 4 times daily. ICD-10 code: 024.419 1 kit 0   . Misc. Devices (GOJJI WEIGHT SCALE) MISC 1 Device by Does not apply route daily as needed. To weight self daily as needed at home. ICD-10 code: Z34.90 1 each 0   . Prenatal Vit w/Fe-Methylfol-FA (PNV PO) Take by mouth.        Review of Systems   All systems reviewed and negative except as stated in HPI  Blood pressure 116/80, pulse 82, temperature 97.6 F (36.4 C), temperature source Axillary, resp. rate 16, height '5\' 3"'  (1.6 m), weight 101.1 kg, last menstrual period 12/25/2018, unknown if currently breastfeeding. General appearance: alert, cooperative and no distress Lungs: No increased WOB Abdomen: soft, non-tender; bowel sounds normal Extremities: Homans sign is negative, no sign of DVT Presentation: cephalic, confirmed with bedside U/S Fetal monitoringBaseline: 135 bpm, Variability: Good {> 6 bpm), Accelerations: Reactive and Decelerations: Absent Uterine activity: every 10 min  Dilation: 1.5 Effacement (%): 50 Station: -3 Exam by:: August Albino RNC   Prenatal labs: ABO, Rh: --/--/O POS (09/29 1122) Antibody: NEG (09/29 1122) Rubella: 5.04 (04/05 0949) RPR: Non Reactive (07/09 0845)  HBsAg: Negative (04/05 0949)  HIV: Non Reactive (07/09 0845)  GBS: Negative/-- (09/02 1605)   2 hr Glucola 84 (fasting) >> 183 (1 hr) >> 152 (2hr)  Genetic screening  NIPS - low risk; AFP - negative  Anatomy US normal    Prenatal Transfer Tool  Maternal Diabetes: Yes:  Diabetes Type:  Diet controlled Genetic Screening: Normal Maternal Ultrasounds/Referrals: Normal Fetal Ultrasounds or other Referrals:  None Maternal Substance Abuse:  No Significant Maternal Medications:  None Significant Maternal Lab Results: None  Results for orders placed or performed during the hospital encounter of 10/02/19 (from the past 24 hour(s))  CBC   Collection Time: 10/02/19 11:22 AM  Result Value Ref  Range   WBC 5.6 4.0 - 10.5 K/uL   RBC 2.91 (L) 3.87 - 5.11 MIL/uL   Hemoglobin 8.4 (L) 12.0 - 15.0 g/dL   HCT 26.6 (L) 36 - 46 %   MCV 91.4 80.0 - 100.0 fL   MCH 28.9 26.0 - 34.0 pg   MCHC 31.6 30.0 - 36.0 g/dL   RDW 14.9 11.5 - 15.5 %   Platelets 273 150 - 400 K/uL   nRBC 0.0 0.0 - 0.2 %  Type and screen [place]   Collection Time: 10/02/19 11:22 AM  Result Value Ref Range   ABO/RH(D) O POS    Antibody Screen NEG    Sample Expiration      10/05/2019,2359 Performed at [facility], [place] [address], [place], [place] [address]   Respiratory Panel by RT PCR (Flu A&B, Covid) - Nasopharyngeal Swab   Collection Time: 10/02/19 11:26 AM   Specimen: Nasopharyngeal Swab  Result Value Ref Range   SARS Coronavirus 2 by RT PCR NEGATIVE NEGATIVE   Influenza A by PCR NEGATIVE NEGATIVE   Influenza B by PCR NEGATIVE NEGATIVE  Glucose, capillary   Collection Time: 10/02/19 12:34 PM  Result Value Ref Range   Glucose-Capillary 76 70 - 99 mg/dL    Patient Active Problem List   Diagnosis Date Noted  . Indication for care in labor or delivery 10/02/2019  . Gestational diabetes 07/17/2019  . Penicillin allergy 04/17/2019  . Obesity affecting pregnancy in second trimester 04/17/2019  . History of gestational hypertension 04/17/2019  . Supervision of other normal pregnancy,  antepartum 04/08/2019  . Intrauterine pregnancy 03/31/2019  . Acute appendicitis 10/25/2014  . Anxiety 11/15/2013  . GERD without esophagitis 07/31/2012    Assessment/Plan:  Pamela Schaefer is a 29 y.o. [id number] at 83w1dhere for IOL due to NR NST in the office earlier today, fortunately reassuring tracing since admission.   #Labor: Will place cytotec x1 with serial cervical exams. Can consider FB placement, however will postpone for now d/t patient preference. She is hoping for a water birth if possible.  #Pain: Prn analgesia.  #FWB: Cat 1 #ID: GBS negative  #MOF: Breast/bottle  #MOC: Considering Depo with interval BTL  #Circ: yes  #A1GDM:  CBG 76 on admit. EFW 36% at 33 weeks.  --Monitor q4 while in latent labor   #Anemia of pregnancy:  Hgb 8.4, will start ferrous sulfate pp. Consider post-partum CBC pending EBL.    SPatriciaann Clan DO  10/02/2019, 1:51 PM

## 2019-10-03 ENCOUNTER — Encounter: Payer: Self-pay | Admitting: General Practice

## 2019-10-03 ENCOUNTER — Encounter (HOSPITAL_COMMUNITY): Payer: Self-pay | Admitting: Obstetrics and Gynecology

## 2019-10-03 LAB — GLUCOSE, CAPILLARY: Glucose-Capillary: 92 mg/dL (ref 70–99)

## 2019-10-03 LAB — RPR: RPR Ser Ql: NONREACTIVE

## 2019-10-03 MED ORDER — ZOLPIDEM TARTRATE 5 MG PO TABS: 5.0000 mg | ORAL_TABLET | Freq: Every evening | ORAL | Status: DC | PRN

## 2019-10-03 MED ORDER — WITCH HAZEL-GLYCERIN EX PADS: 1.0000 "application " | MEDICATED_PAD | CUTANEOUS | Status: DC | PRN

## 2019-10-03 MED ORDER — IBUPROFEN 600 MG PO TABS
600.0000 mg | ORAL_TABLET | Freq: Three times a day (TID) | ORAL | Status: DC
  Administered 2019-10-03 (×3): 600 mg via ORAL
  Filled 2019-10-03 (×3): qty 1

## 2019-10-03 MED ORDER — PRENATAL MULTIVITAMIN CH
1.0000 | ORAL_TABLET | Freq: Every day | ORAL | Status: DC
  Administered 2019-10-03 – 2019-10-04 (×2): 1 via ORAL
  Filled 2019-10-03 (×2): qty 1

## 2019-10-03 MED ORDER — ONDANSETRON HCL 4 MG/2ML IJ SOLN: 4.0000 mg | INTRAMUSCULAR | Status: DC | PRN

## 2019-10-03 MED ORDER — BENZOCAINE-MENTHOL 20-0.5 % EX AERO
1.0000 "application " | INHALATION_SPRAY | CUTANEOUS | Status: DC | PRN
  Administered 2019-10-03: 1 via TOPICAL
  Filled 2019-10-03: qty 56

## 2019-10-03 MED ORDER — OXYCODONE HCL 5 MG PO TABS
5.0000 mg | ORAL_TABLET | Freq: Once | ORAL | Status: AC
  Administered 2019-10-03: 5 mg via ORAL
  Filled 2019-10-03: qty 1

## 2019-10-03 MED ORDER — ACETAMINOPHEN 325 MG PO TABS
650.0000 mg | ORAL_TABLET | Freq: Four times a day (QID) | ORAL | Status: DC
  Administered 2019-10-03 – 2019-10-04 (×8): 650 mg via ORAL
  Filled 2019-10-03 (×8): qty 2

## 2019-10-03 MED ORDER — COCONUT OIL OIL
1.0000 "application " | TOPICAL_OIL | Status: DC | PRN
  Administered 2019-10-03: 1 via TOPICAL

## 2019-10-03 MED ORDER — DIPHENHYDRAMINE HCL 25 MG PO CAPS: 25.0000 mg | ORAL_CAPSULE | Freq: Four times a day (QID) | ORAL | Status: DC | PRN

## 2019-10-03 MED ORDER — ONDANSETRON HCL 4 MG PO TABS: 4.0000 mg | ORAL_TABLET | ORAL | Status: DC | PRN

## 2019-10-03 MED ORDER — DIBUCAINE (PERIANAL) 1 % EX OINT: 1.0000 "application " | TOPICAL_OINTMENT | CUTANEOUS | Status: DC | PRN

## 2019-10-03 MED ORDER — TETANUS-DIPHTH-ACELL PERTUSSIS 5-2.5-18.5 LF-MCG/0.5 IM SUSP: 0.5000 mL | Freq: Once | INTRAMUSCULAR | Status: DC

## 2019-10-03 MED ORDER — SENNOSIDES-DOCUSATE SODIUM 8.6-50 MG PO TABS
2.0000 | ORAL_TABLET | ORAL | Status: DC
  Administered 2019-10-03 – 2019-10-04 (×2): 2 via ORAL
  Filled 2019-10-03 (×2): qty 2

## 2019-10-03 MED ORDER — SIMETHICONE 80 MG PO CHEW: 80.0000 mg | CHEWABLE_TABLET | ORAL | Status: DC | PRN

## 2019-10-03 NOTE — Lactation Note (Addendum)
This note was copied from a baby's chart. Lactation Consultation Note Baby 5 hrs old. Mom had originally denied need for LC. Mom only BF to Lt. Breast d/t has hardened area to Rt. Breast mom was afraid to BF to Rt. Breast. RN asked mom to have LC. Mom agreed.  LC asked mom how could LC help. Mom stated her Rt. Breast has a knot in it can she BF. LC felt area. Mom just noticed it today. LC encouraged mom to have MD assess it. LC encouraged mom to put baby to the breast unless it hurt or she was having discharge from nipples. Mom denied both. LC assisted placing pillows for support.  Baby latched easily to mom's everted nipple. Mom denies painful latch. Discussed body alignment, STS, breast massage, supply and demand. Reviewed newborn feeding habits and behavior.  Mom stated she BF for maybe a week.  Encouraged mom to call for assistance if needed. Lactation brochure given.  Patient Name: Pamela Schaefer [id number] Date: 10/03/2019 Reason for consult: Initial assessment;Term   Maternal Data Has patient been taught Hand Expression?: Yes Does the patient have breastfeeding experience prior to this delivery?: Yes  Feeding Feeding Type: Breast Fed  LATCH Score Latch: Grasps breast easily, tongue down, lips flanged, rhythmical sucking.  Audible Swallowing: A few with stimulation  Type of Nipple: Everted at rest and after stimulation  Comfort (Breast/Nipple): Soft / non-tender  Hold (Positioning): Assistance needed to correctly position infant at breast and maintain latch.  LATCH Score: 8  Interventions Interventions: Breast feeding basics reviewed;Assisted with latch;Breast compression;Skin to skin;Adjust position;Breast massage;Support pillows;Position options  Lactation Tools Discussed/Used WIC Program: No   Consult Status Consult Status: PRN Date: 10/04/19 Follow-up type: In-patient    Kenon Delashmit, Diamond Nickel 10/03/2019, 3:11 AM

## 2019-10-03 NOTE — Progress Notes (Signed)
MOB was referred for history of depression/anxiety. * Referral screened out by Clinical Social Worker because none of the following criteria appear to apply: ~ History of anxiety/depression during this pregnancy, or of post-partum depression following prior delivery. ~ Diagnosis of anxiety and/or depression within last 3 years. Per further chart review, MOB diagnosed with anxiety in 2015.  OR * MOB's symptoms currently being treated with medication and/or therapy.    Please contact the Clinical Social Worker if needs arise, by [facility] request, or if MOB scores greater than 9/yes to question 10 on Edinburgh Postpartum Depression Screen.    Claude Manges Charice Zuno, MSW, LCSW [facility] [place] [phone]

## 2019-10-03 NOTE — Progress Notes (Signed)
S: Called to evaluate breast lump. Patient reports she noted a lump in her right breast while she was taking a shower. She has been breastfeeding without difficulty.   O:  Breast exam: hard approximately 2x2 cm mass felt just lateral to right-side areola in right breast. Dense breast tissue felt throughout bilaterally. No erythema or tenderness to palpation.  A/P: Plan for outpatient follow up. No emergent indication for further evaluation while here. Suspect fibrotic changes as dense tissue felt in bilateral breasts. Hx of breast cancer in half sibling on fathers side. No erythema or tenderness on examination makes infection or abscess unlikely.

## 2019-10-03 NOTE — Lactation Note (Signed)
This note was copied from a baby's chart. Lactation Consultation Note  Patient Name: Pamela Schaefer OHYWV'P Date: 10/03/2019 Reason for consult: Follow-up assessment;Term P5, 19 hour term female infant -1% weight loss. Per mom, her  current feeding plan: is breast and bottle feeding. Per mom, she wants to BF 5th baby longer than 1-2 weeks as she did with her previous children. Per mom, she has been having pain with latch, Infant was not interesting in latching at this time due infant receiving 10 mls of formula from [facility] prior to [facility] entering the room. Mom position infant on her right breast inf the football hold position, infant open mouth wide suckled less than 1 minutes than stopped. Per mom ,she felt the latch was helpful although infant did not fed at this time, but she notice he was deeper on her breast and she was not holding or supporting her breast when feeding infant and he been mostly on the tip of her breast. Per mom, she has lump in the 7 clock position  lower quadrant position and that Physican  did examine it, felt might be blocked duct.  LC discussed hand expression but stop due mom's pain, LC explained how to use harmony hand pump and mom easily pumped 4 mls of colostrum on her right breast. Mom will latch infant at the next feeding and then offer infant her pumped breast milk the 4 mls before supplementing with formula.  Mom knows to BF infant according to hunger cues, 8 to 12+ times within 24 hours, STS Mom's plan: 1. Mom will latch infant at the  breast for each feeding to help establish her milk supply and if she needs assistance with latch she will ask RN or call LC services. 2. Mom will work towards latching infant at breast with depth instead of a shallow latch with good hand position support.  3. Afterwards mom will offer infant any breast milk that she has hand expressed or pumped first with hand pump before supplementing with formula. 4. Mom knows how much to supplement  infant after breastfeeding, supplement guide sheet was given and this is mom's choice to supplement with formula.       Maternal Data    Feeding    LATCH Score Latch: Too sleepy or reluctant, no latch achieved, no sucking elicited.  Audible Swallowing: None  Type of Nipple: Everted at rest and after stimulation  Comfort (Breast/Nipple): Soft / non-tender  Hold (Positioning): Assistance needed to correctly position infant at breast and maintain latch.  LATCH Score: 5  Interventions Interventions: Skin to skin;Breast massage;Hand express;Hand pump;Position options;Support pillows;Adjust position;Breast compression;Assisted with latch  Lactation Tools Discussed/Used     Consult Status Consult Status: Follow-up Date: 10/04/19 Follow-up type: In-patient    Danelle Earthly 10/03/2019, 5:49 PM

## 2019-10-03 NOTE — Progress Notes (Addendum)
POSTPARTUM PROGRESS NOTE  Post Partum Day 1  Subjective:  Pamela Schaefer is a 29 y.o. [id number] s/p IOL for NR NST at [redacted]w[redacted]d.  She reports she is doing well. No acute events overnight. She denies any problems with ambulating, voiding or po intake. Denies flatus. Denies nausea or vomiting.  Pain is moderately controlled.  Lochia is not heavy.  Objective: Blood pressure 101/63, pulse 79, temperature 98.5 F (36.9 C), temperature source Oral, resp. rate 18, height 5\' 3"  (1.6 m), weight 101.1 kg, last menstrual period 12/25/2018, SpO2 98 %, unknown if currently breastfeeding.  Physical Exam:  General: alert, cooperative and no distress Uterine Fundus: firm, appropriately tender Skin: warm, dry  Recent Labs    10/02/19 1122  HGB 8.4*  HCT 26.6*    Assessment/Plan: Pamela Schaefer is a 29 y.o. 34 s/p IOL for NR NST at [redacted]w[redacted]d.  PPD#1 - Doing well. Continue routine postpartum care.  Contraception: unsure Feeding: breast Dispo: Plan for discharge tomorrow.   LOS: 1 day   [redacted]w[redacted]d MD, PGY-1 OBGYN Faculty Teaching Service  10/03/2019, 8:38 AM  Attestation of Supervision of Student:  I confirm that I have verified the information documented in the  resident's note and that I have also personally reperformed the history, physical exam and all medical decision making activities.  I have verified that all services and findings are accurately documented in this student's note; and I agree with management and plan as outlined in the documentation. I have also made any necessary editorial changes.  10/05/2019, MD [facility] for [facility], [facility] 10/03/2019 8:44 AM

## 2019-10-04 MED ORDER — CYCLOBENZAPRINE HCL 10 MG PO TABS
5.0000 mg | ORAL_TABLET | Freq: Once | ORAL | Status: AC | PRN
  Administered 2019-10-04: 5 mg via ORAL
  Filled 2019-10-04: qty 1

## 2019-10-04 MED ORDER — IBUPROFEN 600 MG PO TABS: 600.0000 mg | ORAL_TABLET | Freq: Three times a day (TID) | ORAL | 0 refills | Status: DC | PRN

## 2019-10-04 MED ORDER — COCONUT OIL OIL: 1.0000 "application " | TOPICAL_OIL | 0 refills | Status: DC | PRN

## 2019-10-04 MED ORDER — IBUPROFEN 600 MG PO TABS
600.0000 mg | ORAL_TABLET | Freq: Four times a day (QID) | ORAL | Status: DC
  Administered 2019-10-04 (×3): 600 mg via ORAL
  Filled 2019-10-04 (×3): qty 1

## 2019-10-04 MED ORDER — ACETAMINOPHEN 325 MG PO TABS: 650.0000 mg | ORAL_TABLET | Freq: Four times a day (QID) | ORAL | Status: DC | PRN

## 2019-10-04 NOTE — Lactation Note (Addendum)
This note was copied from a baby's chart. Lactation Consultation Note  Patient Name: Pamela Schaefer Date: 10/04/2019 Reason for consult: Term;Infant weight loss 44 hour term female infant with 3.29% weight loss, infant with elevated billi. LC changed one void and one stool while in room ( brown stool). Feeding choice: "Pump only and give infant back any EBM first and then formula feed infant.  Mom has solely formula fed since 2230 10/03/19 and not latch infant at breast. Per mom, she wants to BF but have sensitive breast she was given coconut oil and comfort gels by RN. Mom latched infant on her right breast using the football hold  and fitted with 16 mm NS due to having sensitive breast, using bottle nipples,  infant latched well, sustaining latch  and BF for 12 minutes but mom is concern  infant is not getting anything from her breast or not enough. LC left the room mom was using the DEBP and colostrum was being expressed while pumping. LC reviewed wit h mom's  infant's small tummy size and with hand expression mom could see colostrum is present breast.  Mom decided that she wants to pump  only and not latch infant at breast until her milk supply is established. LC mention that latching infant at breast is the best way to establish her milk supply but we are here to support her feeding choice.  Mom's current plan: 1. Mom will formula feed infant according to cues, 8 to 12+ times within 24 hours. 2. Mom will use DEBP every 3 hours for 15 minutes to help establish milk supply and will give infant back any EBM first before offering formula. 3. Per mom, she wait until her milk comes in to latch infant at her breast this is her choice and decision.   Maternal Data Formula Feeding for Exclusion: Yes Reason for exclusion: Mother's choice to formula and breast feed on admission  Feeding Feeding Type: Breast Fed Nipple Type: Slow - flow  LATCH Score Latch: Grasps breast easily,  tongue down, lips flanged, rhythmical sucking.  Audible Swallowing: Spontaneous and intermittent  Type of Nipple: Everted at rest and after stimulation  Comfort (Breast/Nipple): Soft / non-tender  Hold (Positioning): Assistance needed to correctly position infant at breast and maintain latch.  LATCH Score: 9  Interventions Interventions: Skin to skin;DEBP;Expressed milk;Hand express;Position options;Support pillows;Breast massage;Adjust position;Breast compression;Assisted with latch;Breast feeding basics reviewed;Comfort gels;Hand pump  Lactation Tools Discussed/Used Tools: Comfort gels;Nipple Shields Nipple shield size: 16 Pump Review: Setup, frequency, and cleaning;Milk Storage Initiated by:: Danelle Earthly, IBCLC Date initiated:: 10/04/19   Consult Status Consult Status: Follow-up Date: 10/05/19 Follow-up type: In-patient    Danelle Earthly 10/04/2019, 6:18 PM

## 2019-10-04 NOTE — Lactation Note (Signed)
This note was copied from a baby's chart. Lactation Consultation Note  Patient Name: Boy Nevae Pinnix [id number] Date: 10/04/2019 Reason for consult: Follow-up assessment;Term 52 36hrs old, wt loss 3.29%, mom on the phone, requests lactation to return later today. Mom states she has put breastfeeding on hold right now d/t sore cracked nipples, reports having a blister which has since resolved. Mom would like LC to return to assess a feeding prior to discharge. Comfort Gels given, encouraged to apply to breasts now and keep in place until next feeding at the breast, advised will promote healing and comfort. Mom voiced understanding and with no further concerns. Left the room with mom initiating a bottle feed with formula. BGilliam, RN, IBCLC      Interventions Interventions: Comfort gels    Consult Status Consult Status: Follow-up Date: 10/04/19 Follow-up type: In-patient    Charlynn Court 10/04/2019, 10:57 AM

## 2019-10-04 NOTE — Discharge Instructions (Signed)

## 2019-10-09 ENCOUNTER — Other Ambulatory Visit: Payer: Self-pay

## 2019-10-09 ENCOUNTER — Observation Stay (HOSPITAL_COMMUNITY)
Admission: AD | Admit: 2019-10-09 | Discharge: 2019-10-11 | Disposition: A | Discharge status: Home / Self Care | Payer: Medicaid Other | Attending: Obstetrics & Gynecology | Admitting: Obstetrics & Gynecology

## 2019-10-09 ENCOUNTER — Encounter (HOSPITAL_COMMUNITY): Payer: Self-pay | Admitting: Obstetrics & Gynecology

## 2019-10-09 DIAGNOSIS — Z20822 Contact with and (suspected) exposure to covid-19: Secondary | ICD-10-CM | POA: Diagnosis not present

## 2019-10-09 DIAGNOSIS — O1495 Unspecified pre-eclampsia, complicating the puerperium: Secondary | ICD-10-CM

## 2019-10-09 DIAGNOSIS — H43399 Other vitreous opacities, unspecified eye: Secondary | ICD-10-CM | POA: Diagnosis not present

## 2019-10-09 DIAGNOSIS — Z87891 Personal history of nicotine dependence: Secondary | ICD-10-CM | POA: Insufficient documentation

## 2019-10-09 DIAGNOSIS — O24419 Gestational diabetes mellitus in pregnancy, unspecified control: Secondary | ICD-10-CM | POA: Insufficient documentation

## 2019-10-09 DIAGNOSIS — R001 Bradycardia, unspecified: Secondary | ICD-10-CM | POA: Insufficient documentation

## 2019-10-09 DIAGNOSIS — I440 Atrioventricular block, first degree: Secondary | ICD-10-CM | POA: Insufficient documentation

## 2019-10-09 DIAGNOSIS — O1415 Severe pre-eclampsia, complicating the puerperium: Secondary | ICD-10-CM

## 2019-10-09 DIAGNOSIS — O152 Eclampsia in the puerperium: Principal | ICD-10-CM | POA: Insufficient documentation

## 2019-10-09 HISTORY — DX: Severe pre-eclampsia, complicating the puerperium: O14.15

## 2019-10-09 LAB — URINALYSIS, ROUTINE W REFLEX MICROSCOPIC
Bacteria, UA: NONE SEEN
Bilirubin Urine: NEGATIVE
Glucose, UA: NEGATIVE mg/dL
Ketones, ur: NEGATIVE mg/dL
Leukocytes,Ua: NEGATIVE
Nitrite: NEGATIVE
Protein, ur: NEGATIVE mg/dL
Specific Gravity, Urine: 1.008 (ref 1.005–1.030)
pH: 7 (ref 5.0–8.0)

## 2019-10-09 MED ORDER — IBUPROFEN 600 MG PO TABS
600.0000 mg | ORAL_TABLET | Freq: Four times a day (QID) | ORAL | Status: DC | PRN
  Administered 2019-10-10: 600 mg via ORAL
  Filled 2019-10-09: qty 1

## 2019-10-09 MED ORDER — ONDANSETRON HCL 4 MG/2ML IJ SOLN: 4.0000 mg | Freq: Four times a day (QID) | INTRAMUSCULAR | Status: DC | PRN

## 2019-10-09 MED ORDER — ONDANSETRON HCL 4 MG PO TABS: 4.0000 mg | ORAL_TABLET | Freq: Four times a day (QID) | ORAL | Status: DC | PRN

## 2019-10-09 MED ORDER — ENOXAPARIN SODIUM 40 MG/0.4ML ~~LOC~~ SOLN
40.0000 mg | SUBCUTANEOUS | Status: DC
  Administered 2019-10-10 – 2019-10-11 (×2): 40 mg via SUBCUTANEOUS
  Filled 2019-10-09 (×2): qty 0.4

## 2019-10-09 MED ORDER — LACTATED RINGERS IV SOLN
INTRAVENOUS | Status: DC
  Administered 2019-10-10: 75 mL/h via INTRAVENOUS

## 2019-10-09 MED ORDER — PRENATAL MULTIVITAMIN CH
1.0000 | ORAL_TABLET | Freq: Every day | ORAL | Status: DC
  Administered 2019-10-10 – 2019-10-11 (×2): 1 via ORAL
  Filled 2019-10-09 (×2): qty 1

## 2019-10-09 MED ORDER — SIMETHICONE 80 MG PO CHEW: 80.0000 mg | CHEWABLE_TABLET | Freq: Four times a day (QID) | ORAL | Status: DC | PRN

## 2019-10-09 MED ORDER — ACETAMINOPHEN 325 MG PO TABS
650.0000 mg | ORAL_TABLET | Freq: Once | ORAL | Status: AC
  Administered 2019-10-09: 650 mg via ORAL
  Filled 2019-10-09: qty 2

## 2019-10-09 MED ORDER — FUROSEMIDE 10 MG/ML IJ SOLN
40.0000 mg | Freq: Once | INTRAMUSCULAR | Status: AC
  Administered 2019-10-10: 40 mg via INTRAVENOUS
  Filled 2019-10-09: qty 4

## 2019-10-09 MED ORDER — ALUM & MAG HYDROXIDE-SIMETH 200-200-20 MG/5ML PO SUSP
30.0000 mL | ORAL | Status: DC | PRN
  Administered 2019-10-10: 30 mL via ORAL
  Filled 2019-10-09: qty 30

## 2019-10-09 NOTE — MAU Note (Signed)
Covid swab obtained without difficulty and pt tol well. No symptoms 

## 2019-10-09 NOTE — MAU Note (Signed)
.   Pamela Schaefer is a 29 y.o. postpartum vaginal delivery day 6 here in MAU reporting: Headache x3 days, BP at home was 152/96 and then 173/109. She also endorses edema in BLE and seeing floaters. Patient states she had Pre E with her last baby.   Pain score: 7 Vitals:   10/09/19 2312  BP: (!) 132/92  Pulse: 68  Resp: 15  Temp: 98.5 F (36.9 C)  SpO2: 99%

## 2019-10-09 NOTE — MAU Provider Note (Addendum)
Chief Complaint:  Hypertension   First Provider Initiated Contact with Patient 10/09/19 2325      HPI: Pamela Schaefer is a 29 y.o. [id number] who presents to maternity admissions reporting headache for 3 days.  Was seeing floaters today and increased edema, especially in feet.  Took BP at home and it was elevated.  Does have a history of preeclampsia with prior pregnancy.  . She reports minimal vaginal bleeding, but no vaginal itching/burning, urinary symptoms, dizziness, n/v, or fever/chills.    Hypertension This is a new problem. The current episode started in the past 7 days. The problem is unchanged. Associated symptoms include headaches and peripheral edema. Pertinent negatives include no anxiety, blurred vision (but is seeing floaters/spots), chest pain, palpitations or shortness of breath. There are no associated agents to hypertension. Risk factors: history of preeclampsia in past pregnancy. Past treatments include nothing. There are no compliance problems.    RN Note: RENELL COAXUM is a 29 y.o. postpartum vaginal delivery day 6 here in MAU reporting: Headache x3 days, BP at home was 152/96 and then 173/109. She also endorses edema in BLE and seeing floaters. Patient states she had Pre E with her last baby. Pain score: 7  Past Medical History: Past Medical History:  Diagnosis Date  . Gestational diabetes 07/17/2019  . PONV (postoperative nausea and vomiting)   . Pregnancy induced hypertension    #4    Past obstetric history: OB History  Gravida Para Term Preterm AB Living  7 5 5  0 2 5  SAB TAB Ectopic Multiple Live Births  1 1 0 0 5    # Outcome Date GA Lbr Len/2nd Weight Sex Delivery Anes PTL Lv  7 Term 10/02/19 [redacted]w[redacted]d 02:34 / 00:06 3805 g M Vag-Spont None  LIV  6 Term 10/17/13 [redacted]w[redacted]d 08:24 / 00:33 2835 g F Vag-Spont EPI  LIV  5 Term 12/08/12 [redacted]w[redacted]d 21:00 / 00:29 3525 g F Vag-Spont EPI  LIV     Birth Comments: none  4 Term 04/25/11 [redacted]w[redacted]d 08:43 / 00:17 3856 g F Vag-Spont  EPI  LIV     Birth Comments: NA  3 Term 09/21/07 [redacted]w[redacted]d 08:00 2892 g M Vag-Spont EPI  LIV  2 TAB           1 SAB              Birth Comments: System Generated. Please review and update pregnancy details.    Obstetric Comments  Pre E w/#4    Past Surgical History: Past Surgical History:  Procedure Laterality Date  . CLAVICLE SURGERY    . INDUCED ABORTION    . LAPAROSCOPIC APPENDECTOMY N/A 10/25/2014   Procedure: APPENDECTOMY LAPAROSCOPIC;  Surgeon: 10/27/2014, MD;  Location: [facility] OR;  Service: General;  Laterality: N/A;    Family History: Family History  Problem Relation Age of Onset  . Heart disease Maternal Grandfather   . Kidney disease Maternal Grandfather        failure  . Healthy Mother   . Heart disease Father   . Diabetes Maternal Grandmother   . Hypertension Maternal Grandmother   . Colon cancer Neg Hx   . Stomach cancer Neg Hx   . Rectal cancer Neg Hx   . Esophageal cancer Neg Hx   . Liver cancer Neg Hx     Social History: Social History   Tobacco Use  . Smoking status: Former Smoker    Packs/day: 0.25    Types: Cigarettes  . Smokeless  tobacco: Former Clinical biochemist  . Vaping Use: Never used  Substance Use Topics  . Alcohol use: Not Currently    Alcohol/week: 0.0 standard drinks    Comment: social  . Drug use: No    Allergies:  Allergies  Allergen Reactions  . Penicillins Itching    Meds:  Medications Prior to Admission  Medication Sig Dispense Refill Last Dose  . acetaminophen (TYLENOL) 325 MG tablet Take 2 tablets (650 mg total) by mouth every 6 (six) hours as needed for mild pain or moderate pain.   10/09/2019 at 1800  . Multiple Vitamin (MULTIVITAMIN WITH MINERALS) TABS tablet Take 1 tablet by mouth daily.   Past Week at Unknown time  . naproxen sodium (ALEVE) 220 MG tablet Take 220 mg by mouth.   10/08/2019 at Unknown time  . coconut oil OIL Apply 1 application topically as needed (nipple pain).  0   . ibuprofen (ADVIL) 600 MG tablet Take  1 tablet (600 mg total) by mouth every 8 (eight) hours as needed for moderate pain or cramping.  0     I have reviewed patient's Past Medical Hx, Surgical Hx, Family Hx, Social Hx, medications and allergies.  ROS:  Review of Systems  Constitutional: Negative for chills and fever.  HENT: Negative for congestion, sinus pain and sore throat.   Eyes: Positive for visual disturbance. Negative for blurred vision (but is seeing floaters/spots).  Respiratory: Negative for chest tightness and shortness of breath.   Cardiovascular: Positive for leg swelling. Negative for chest pain and palpitations.  Gastrointestinal: Negative for constipation, diarrhea, nausea and vomiting.  Genitourinary: Negative for pelvic pain.  Neurological: Positive for headaches. Negative for weakness.   Other systems negative     Physical Exam   Patient Vitals for the past 24 hrs:  BP Temp Pulse Resp SpO2 Weight  10/09/19 2312 (!) 132/92 98.5 F (36.9 C) 68 15 99 % 97.2 kg   Vitals:   10/09/19 2312 10/09/19 2331 10/09/19 2352  BP: (!) 132/92 (!) 146/98 (!) 147/98  Pulse: 68 (!) 55 61  Resp: 15    Temp: 98.5 F (36.9 C)    SpO2: 99%    Weight: 97.2 kg      Constitutional: Well-developed, well-nourished female in no acute distress.  Cardiovascular: Bradycardia, no ectopy audible, S1 & S2 heard, no murmur Respiratory: normal effort, no distress. Lungs CTAB with no wheezes or crackles GI: Abd soft, non-tender.  Nondistended.  No rebound, No guarding.  Bowel Sounds audible  MS: Extremities nontender, 2+ edema, normal ROM Neurologic: Alert and oriented x 4.   Grossly nonfocal.  DTRs 3+/no clonus GU: Neg CVAT. Skin:  Warm and Dry Psych:  Affect appropriate.  PELVIC EXAM: deferred, minimal lochia   Labs: Results for orders placed or performed during the hospital encounter of 10/09/19 (from the past 24 hour(s))  Urinalysis, Routine w reflex microscopic Urine, Clean Catch     Status: Abnormal   Collection  Time: 10/09/19 11:17 PM  Result Value Ref Range   Color, Urine STRAW (A) YELLOW   APPearance CLEAR CLEAR   Specific Gravity, Urine 1.008 1.005 - 1.030   pH 7.0 5.0 - 8.0   Glucose, UA NEGATIVE NEGATIVE mg/dL   Hgb urine dipstick MODERATE (A) NEGATIVE   Bilirubin Urine NEGATIVE NEGATIVE   Ketones, ur NEGATIVE NEGATIVE mg/dL   Protein, ur NEGATIVE NEGATIVE mg/dL   Nitrite NEGATIVE NEGATIVE   Leukocytes,Ua NEGATIVE NEGATIVE   RBC / HPF 0-5 0 -  5 RBC/hpf   WBC, UA 0-5 0 - 5 WBC/hpf   Bacteria, UA NONE SEEN NONE SEEN   Squamous Epithelial / LPF 0-5 0 - 5  CBC     Status: Abnormal   Collection Time: 10/09/19 11:23 PM  Result Value Ref Range   WBC 7.4 4.0 - 10.5 K/uL   RBC 3.03 (L) 3.87 - 5.11 MIL/uL   Hemoglobin 8.9 (L) 12.0 - 15.0 g/dL   HCT 87.5 (L) 36 - 46 %   MCV 92.7 80.0 - 100.0 fL   MCH 29.4 26.0 - 34.0 pg   MCHC 31.7 30.0 - 36.0 g/dL   RDW 64.3 32.9 - 51.8 %   Platelets 338 150 - 400 K/uL   nRBC 0.0 0.0 - 0.2 %  Comprehensive metabolic panel     Status: Abnormal   Collection Time: 10/09/19 11:23 PM  Result Value Ref Range   Sodium 140 135 - 145 mmol/L   Potassium 3.7 3.5 - 5.1 mmol/L   Chloride 109 98 - 111 mmol/L   CO2 21 (L) 22 - 32 mmol/L   Glucose, Bld 82 70 - 99 mg/dL   BUN 11 6 - 20 mg/dL   Creatinine, Ser 8.41 0.44 - 1.00 mg/dL   Calcium 8.8 (L) 8.9 - 10.3 mg/dL   Total Protein 5.8 (L) 6.5 - 8.1 g/dL   Albumin 3.0 (L) 3.5 - 5.0 g/dL   AST 39 15 - 41 U/L   ALT 41 0 - 44 U/L   Alkaline Phosphatase 91 38 - 126 U/L   Total Bilirubin 0.6 0.3 - 1.2 mg/dL   GFR calc non Af Amer >60 >60 mL/min   Anion gap 10 5 - 15  Protein / creatinine ratio, urine     Status: None   Collection Time: 10/09/19 11:23 PM  Result Value Ref Range   Creatinine, Urine 64.40 mg/dL   Total Protein, Urine <6 mg/dL   Protein Creatinine Ratio        0.00 - 0.15 mg/mg[Cre]  Troponin I (High Sensitivity)     Status: Abnormal   Collection Time: 10/09/19 11:47 PM  Result Value Ref Range    Troponin I (High Sensitivity) 20 (H) <18 ng/L    --/--/O POS (09/29 1122)  Imaging:  DG Chest 2 View  Result Date: 10/10/2019 CLINICAL DATA:  Initial evaluation for postpartum preeclampsia. EXAM: CHEST - 2 VIEW COMPARISON:  Prior radiograph from 02/25/2018. FINDINGS: Heart size appears mildly enlarged.  Mediastinal silhouette normal. Lungs well inflated. Minimal left basilar subsegmental atelectasis. No focal infiltrates. No pulmonary edema or pleural effusion. No pneumothorax. No acute osseous abnormality.  Mild sigmoid scoliosis noted. IMPRESSION: 1. Mild cardiomegaly without pulmonary edema. 2. Minimal left basilar subsegmental atelectasis. 3. No other active cardiopulmonary disease. Electronically Signed   By: Rise Mu M.D.   On: 10/10/2019 00:28     MAU Course/MDM: I have ordered labs as follows:  CBC, CMET, PrCrR, Troponin, BNP Imaging ordered: Chest Xray (PA/Lat) Results reviewed.   Consult Dr Despina Hidden. He recommends admission, MgSO4, Telemetry, Lasix 40mg .   Troponin slightly elevated but CXR showed no pulm edema, per Dr no need to repeat Troponin Treatments in MAU included Admission assessments.   Pt stable at time of transfer.  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

## 2019-10-10 ENCOUNTER — Observation Stay (HOSPITAL_COMMUNITY): Payer: Medicaid Other

## 2019-10-10 DIAGNOSIS — I440 Atrioventricular block, first degree: Secondary | ICD-10-CM

## 2019-10-10 DIAGNOSIS — O99893 Other specified diseases and conditions complicating puerperium: Secondary | ICD-10-CM | POA: Diagnosis not present

## 2019-10-10 DIAGNOSIS — R001 Bradycardia, unspecified: Secondary | ICD-10-CM | POA: Diagnosis not present

## 2019-10-10 DIAGNOSIS — O1415 Severe pre-eclampsia, complicating the puerperium: Secondary | ICD-10-CM

## 2019-10-10 DIAGNOSIS — O9943 Diseases of the circulatory system complicating the puerperium: Secondary | ICD-10-CM | POA: Diagnosis not present

## 2019-10-10 DIAGNOSIS — R7989 Other specified abnormal findings of blood chemistry: Secondary | ICD-10-CM | POA: Diagnosis not present

## 2019-10-10 LAB — COMPREHENSIVE METABOLIC PANEL
ALT: 41 U/L (ref 0–44)
AST: 39 U/L (ref 15–41)
Albumin: 3 g/dL — ABNORMAL LOW (ref 3.5–5.0)
Alkaline Phosphatase: 91 U/L (ref 38–126)
Anion gap: 10 (ref 5–15)
BUN: 11 mg/dL (ref 6–20)
CO2: 21 mmol/L — ABNORMAL LOW (ref 22–32)
Calcium: 8.8 mg/dL — ABNORMAL LOW (ref 8.9–10.3)
Chloride: 109 mmol/L (ref 98–111)
Creatinine, Ser: 0.82 mg/dL (ref 0.44–1.00)
GFR calc non Af Amer: >60 mL/min (ref >60)
Glucose, Bld: 82 mg/dL (ref 70–99)
Potassium: 3.7 mmol/L (ref 3.5–5.1)
Sodium: 140 mmol/L (ref 135–145)
Total Bilirubin: 0.6 mg/dL (ref 0.3–1.2)
Total Protein: 5.8 g/dL — ABNORMAL LOW (ref 6.5–8.1)

## 2019-10-10 LAB — PROTEIN / CREATININE RATIO, URINE
Creatinine, Urine: 64.4 mg/dL
Total Protein, Urine: <6 mg/dL

## 2019-10-10 LAB — CBC
HCT: 28.1 % — ABNORMAL LOW (ref 36.0–46.0)
Hemoglobin: 8.9 g/dL — ABNORMAL LOW (ref 12.0–15.0)
MCH: 29.4 pg (ref 26.0–34.0)
MCHC: 31.7 g/dL (ref 30.0–36.0)
MCV: 92.7 fL (ref 80.0–100.0)
Platelets: 338 10*3/uL (ref 150–400)
RBC: 3.03 MIL/uL — ABNORMAL LOW (ref 3.87–5.11)
RDW: 15.4 % (ref 11.5–15.5)
WBC: 7.4 10*3/uL (ref 4.0–10.5)
nRBC: 0 % (ref 0.0–0.2)

## 2019-10-10 LAB — TROPONIN I (HIGH SENSITIVITY)
Troponin I (High Sensitivity): 16 ng/L (ref <18)
Troponin I (High Sensitivity): 20 ng/L — ABNORMAL HIGH (ref <18)

## 2019-10-10 LAB — BRAIN NATRIURETIC PEPTIDE
B Natriuretic Peptide: 437.8 pg/mL — ABNORMAL HIGH (ref 0.0–100.0)
B Natriuretic Peptide: 378.2 pg/mL — ABNORMAL HIGH (ref 0.0–100.0)

## 2019-10-10 LAB — RESPIRATORY PANEL BY RT PCR (FLU A&B, COVID)
Influenza A by PCR: NEGATIVE
Influenza B by PCR: NEGATIVE
SARS Coronavirus 2 by RT PCR: NEGATIVE

## 2019-10-10 MED ORDER — LABETALOL HCL 5 MG/ML IV SOLN: 40.0000 mg | INTRAVENOUS | Status: DC | PRN

## 2019-10-10 MED ORDER — LABETALOL HCL 5 MG/ML IV SOLN: 20.0000 mg | INTRAVENOUS | Status: DC | PRN

## 2019-10-10 MED ORDER — BUTALBITAL-APAP-CAFFEINE 50-325-40 MG PO TABS
1.0000 | ORAL_TABLET | Freq: Four times a day (QID) | ORAL | Status: DC | PRN
  Administered 2019-10-10 (×2): 1 via ORAL
  Filled 2019-10-10 (×2): qty 1

## 2019-10-10 MED ORDER — ENALAPRIL MALEATE 5 MG PO TABS
10.0000 mg | ORAL_TABLET | Freq: Every day | ORAL | Status: DC
  Administered 2019-10-10 – 2019-10-11 (×2): 10 mg via ORAL
  Filled 2019-10-10 (×2): qty 2

## 2019-10-10 MED ORDER — MAGNESIUM SULFATE BOLUS VIA INFUSION
4.0000 g | Freq: Once | INTRAVENOUS | Status: AC
  Administered 2019-10-10: 4 g via INTRAVENOUS
  Filled 2019-10-10: qty 1000

## 2019-10-10 MED ORDER — LABETALOL HCL 5 MG/ML IV SOLN: 80.0000 mg | INTRAVENOUS | Status: DC | PRN

## 2019-10-10 MED ORDER — FUROSEMIDE 10 MG/ML IJ SOLN
40.0000 mg | Freq: Once | INTRAMUSCULAR | Status: AC
  Administered 2019-10-10: 40 mg via INTRAVENOUS
  Filled 2019-10-10: qty 4

## 2019-10-10 MED ORDER — MAGNESIUM SULFATE 40 GM/1000ML IV SOLN
2.0000 g/h | INTRAVENOUS | Status: AC
  Administered 2019-10-10 (×3): 2 g/h via INTRAVENOUS
  Filled 2019-10-10 (×2): qty 1000

## 2019-10-10 NOTE — H&P (Addendum)
Chief Complaint:  Hypertension   First Provider Initiated Contact with Patient 10/09/19 2325      HPI: Pamela Schaefer is a 29 y.o. [id number] who presents to maternity admissions reporting headache for 3 days.  Was seeing floaters today and increased edema, especially in feet.  Took BP at home and it was elevated.  Does have a history of preeclampsia with prior pregnancy.  . She reports minimal vaginal bleeding, but no vaginal itching/burning, urinary symptoms, dizziness, n/v, or fever/chills.    Hypertension This is a new problem. The current episode started in the past 7 days. The problem is unchanged. Associated symptoms include headaches and peripheral edema. Pertinent negatives include no anxiety, blurred vision (but is seeing floaters/spots), chest pain, palpitations or shortness of breath. There are no associated agents to hypertension. Risk factors: history of preeclampsia in past pregnancy. Past treatments include nothing. There are no compliance problems.    RN Note: Pamela Schaefer is a 29 y.o. postpartum vaginal delivery day 6 here in MAU reporting: Headache x3 days, BP at home was 152/96 and then 173/109. She also endorses edema in BLE and seeing floaters. Patient states she had Pre E with her last baby. Pain score: 7  Past Medical History: Past Medical History:  Diagnosis Date  . Gestational diabetes 07/17/2019  . PONV (postoperative nausea and vomiting)   . Pregnancy induced hypertension    #4    Past obstetric history: OB History  Gravida Para Term Preterm AB Living  7 5 5  0 2 5  SAB TAB Ectopic Multiple Live Births  1 1 0 0 5    # Outcome Date GA Lbr Len/2nd Weight Sex Delivery Anes PTL Lv  7 Term 10/02/19 [redacted]w[redacted]d 02:34 / 00:06 3805 g M Vag-Spont None  LIV  6 Term 10/17/13 [redacted]w[redacted]d 08:24 / 00:33 2835 g F Vag-Spont EPI  LIV  5 Term 12/08/12 [redacted]w[redacted]d 21:00 / 00:29 3525 g F Vag-Spont EPI  LIV     Birth Comments: none  4 Term 04/25/11 [redacted]w[redacted]d 08:43 / 00:17 3856 g F Vag-Spont  EPI  LIV     Birth Comments: NA  3 Term 09/21/07 [redacted]w[redacted]d 08:00 2892 g M Vag-Spont EPI  LIV  2 TAB           1 SAB              Birth Comments: System Generated. Please review and update pregnancy details.    Obstetric Comments  Pre E w/#4    Past Surgical History: Past Surgical History:  Procedure Laterality Date  . CLAVICLE SURGERY    . INDUCED ABORTION    . LAPAROSCOPIC APPENDECTOMY N/A 10/25/2014   Procedure: APPENDECTOMY LAPAROSCOPIC;  Surgeon: 10/27/2014, MD;  Location: [facility] OR;  Service: General;  Laterality: N/A;    Family History: Family History  Problem Relation Age of Onset  . Heart disease Maternal Grandfather   . Kidney disease Maternal Grandfather        failure  . Healthy Mother   . Heart disease Father   . Diabetes Maternal Grandmother   . Hypertension Maternal Grandmother   . Colon cancer Neg Hx   . Stomach cancer Neg Hx   . Rectal cancer Neg Hx   . Esophageal cancer Neg Hx   . Liver cancer Neg Hx     Social History: Social History   Tobacco Use  . Smoking status: Former Smoker    Packs/day: 0.25    Types: Cigarettes  . Smokeless  tobacco: Former Clinical biochemist  . Vaping Use: Never used  Substance Use Topics  . Alcohol use: Not Currently    Alcohol/week: 0.0 standard drinks    Comment: social  . Drug use: No    Allergies:  Allergies  Allergen Reactions  . Penicillins Itching    Meds:  Medications Prior to Admission  Medication Sig Dispense Refill Last Dose  . acetaminophen (TYLENOL) 325 MG tablet Take 2 tablets (650 mg total) by mouth every 6 (six) hours as needed for mild pain or moderate pain.   10/09/2019 at 1800  . Multiple Vitamin (MULTIVITAMIN WITH MINERALS) TABS tablet Take 1 tablet by mouth daily.   Past Week at Unknown time  . naproxen sodium (ALEVE) 220 MG tablet Take 220 mg by mouth.   10/08/2019 at Unknown time  . coconut oil OIL Apply 1 application topically as needed (nipple pain).  0   . ibuprofen (ADVIL) 600 MG tablet Take  1 tablet (600 mg total) by mouth every 8 (eight) hours as needed for moderate pain or cramping.  0     I have reviewed patient's Past Medical Hx, Surgical Hx, Family Hx, Social Hx, medications and allergies.  ROS:  Review of Systems  Constitutional: Negative for chills and fever.  HENT: Negative for congestion, sinus pain and sore throat.   Eyes: Positive for visual disturbance. Negative for blurred vision (but is seeing floaters/spots).  Respiratory: Negative for chest tightness and shortness of breath.   Cardiovascular: Positive for leg swelling. Negative for chest pain and palpitations.  Gastrointestinal: Negative for constipation, diarrhea, nausea and vomiting.  Genitourinary: Negative for pelvic pain.  Neurological: Positive for headaches. Negative for weakness.   Other systems negative     Physical Exam   Patient Vitals for the past 24 hrs:  BP Temp Pulse Resp SpO2 Weight  10/09/19 2312 (!) 132/92 98.5 F (36.9 C) 68 15 99 % 97.2 kg   Vitals:   10/09/19 2312 10/09/19 2331 10/09/19 2352  BP: (!) 132/92 (!) 146/98 (!) 147/98  Pulse: 68 (!) 55 61  Resp: 15    Temp: 98.5 F (36.9 C)    SpO2: 99%    Weight: 97.2 kg      Constitutional: Well-developed, well-nourished female in no acute distress.  Cardiovascular: Bradycardia, no ectopy audible, S1 & S2 heard, no murmur Respiratory: normal effort, no distress. Lungs CTAB with no wheezes or crackles GI: Abd soft, non-tender.  Nondistended.  No rebound, No guarding.  Bowel Sounds audible  MS: Extremities nontender, 2+ edema, normal ROM Neurologic: Alert and oriented x 4.   Grossly nonfocal.  DTRs 3+/no clonus GU: Neg CVAT. Skin:  Warm and Dry Psych:  Affect appropriate.  PELVIC EXAM: deferred, minimal lochia   Labs: Results for orders placed or performed during the hospital encounter of 10/09/19 (from the past 24 hour(s))  Urinalysis, Routine w reflex microscopic Urine, Clean Catch     Status: Abnormal   Collection  Time: 10/09/19 11:17 PM  Result Value Ref Range   Color, Urine STRAW (A) YELLOW   APPearance CLEAR CLEAR   Specific Gravity, Urine 1.008 1.005 - 1.030   pH 7.0 5.0 - 8.0   Glucose, UA NEGATIVE NEGATIVE mg/dL   Hgb urine dipstick MODERATE (A) NEGATIVE   Bilirubin Urine NEGATIVE NEGATIVE   Ketones, ur NEGATIVE NEGATIVE mg/dL   Protein, ur NEGATIVE NEGATIVE mg/dL   Nitrite NEGATIVE NEGATIVE   Leukocytes,Ua NEGATIVE NEGATIVE   RBC / HPF 0-5 0 -  5 RBC/hpf   WBC, UA 0-5 0 - 5 WBC/hpf   Bacteria, UA NONE SEEN NONE SEEN   Squamous Epithelial / LPF 0-5 0 - 5  CBC     Status: Abnormal   Collection Time: 10/09/19 11:23 PM  Result Value Ref Range   WBC 7.4 4.0 - 10.5 K/uL   RBC 3.03 (L) 3.87 - 5.11 MIL/uL   Hemoglobin 8.9 (L) 12.0 - 15.0 g/dL   HCT 28.3 (L) 36 - 46 %   MCV 92.7 80.0 - 100.0 fL   MCH 29.4 26.0 - 34.0 pg   MCHC 31.7 30.0 - 36.0 g/dL   RDW 15.1 76.1 - 60.7 %   Platelets 338 150 - 400 K/uL   nRBC 0.0 0.0 - 0.2 %  Comprehensive metabolic panel     Status: Abnormal   Collection Time: 10/09/19 11:23 PM  Result Value Ref Range   Sodium 140 135 - 145 mmol/L   Potassium 3.7 3.5 - 5.1 mmol/L   Chloride 109 98 - 111 mmol/L   CO2 21 (L) 22 - 32 mmol/L   Glucose, Bld 82 70 - 99 mg/dL   BUN 11 6 - 20 mg/dL   Creatinine, Ser 3.71 0.44 - 1.00 mg/dL   Calcium 8.8 (L) 8.9 - 10.3 mg/dL   Total Protein 5.8 (L) 6.5 - 8.1 g/dL   Albumin 3.0 (L) 3.5 - 5.0 g/dL   AST 39 15 - 41 U/L   ALT 41 0 - 44 U/L   Alkaline Phosphatase 91 38 - 126 U/L   Total Bilirubin 0.6 0.3 - 1.2 mg/dL   GFR calc non Af Amer >60 >60 mL/min   Anion gap 10 5 - 15  Protein / creatinine ratio, urine     Status: None   Collection Time: 10/09/19 11:23 PM  Result Value Ref Range   Creatinine, Urine 64.40 mg/dL   Total Protein, Urine <6 mg/dL   Protein Creatinine Ratio        0.00 - 0.15 mg/mg[Cre]  Troponin I (High Sensitivity)     Status: Abnormal   Collection Time: 10/09/19 11:47 PM  Result Value Ref Range    Troponin I (High Sensitivity) 20 (H) <18 ng/L     Ref. Range 10/09/2019 23:46  B Natriuretic Peptide Latest Ref Range: 0.0 - 100.0 pg/mL 437.8 (H)   --/--/O POS (09/29 1122)  Imaging:  DG Chest 2 View  Result Date: 10/10/2019 CLINICAL DATA:  Initial evaluation for postpartum preeclampsia. EXAM: CHEST - 2 VIEW COMPARISON:  Prior radiograph from 02/25/2018. FINDINGS: Heart size appears mildly enlarged.  Mediastinal silhouette normal. Lungs well inflated. Minimal left basilar subsegmental atelectasis. No focal infiltrates. No pulmonary edema or pleural effusion. No pneumothorax. No acute osseous abnormality.  Mild sigmoid scoliosis noted. IMPRESSION: 1. Mild cardiomegaly without pulmonary edema. 2. Minimal left basilar subsegmental atelectasis. 3. No other active cardiopulmonary disease. Electronically Signed   By: Rise Mu M.D.   On: 10/10/2019 00:28     MAU Course/MDM: I have ordered labs as follows:  CBC, CMET, PrCrR, Troponin, BNP Imaging ordered: Chest Xray (PA/Lat) Results reviewed.   Consult Dr Despina Hidden. He recommends admission, MgSO4, Telemetry, Lasix 40mg .   Troponin slightly elevated but CXR showed no pulm edema, per Dr no need to repeat Troponin Treatments in MAU included Admission assessments.   Pt stable at time of transfer.  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

## 2019-10-10 NOTE — Procedures (Signed)
Echo attempted. Patient requested that we attempt again later. Will attempt again.

## 2019-10-10 NOTE — Consult Note (Addendum)
Cardiology Consultation:   Patient ID: Pamela Schaefer; [id number]; 07/23/1990   Admit date: 10/09/2019 Date of Consult: 10/10/2019  Primary Care Provider: Patient, No Pcp Per Primary Cardiologist: New to Dr. Anne FuSkains Primary Electrophysiologist:  None   Patient Profile:   Pamela Schaefer is a 29 y.o. female with a PMH of pregnancy induced HTN, gestational diabetes, and history of pre-eclampsia who is being seen today for the evaluation of bradycardia with 1st degree AV block at the request of Dr. Despina HiddenEure.  History of Present Illness:   Pamela Schaefer recently delivered a healthy baby boy 10/02/19. Her post-delivery course was immediately uncomplicated and she was discharged home 10/04/19. Unfortunately she developed headaches ~3 days ago. Also noting LE edema and visual floaters. She took her blood pressure at home and was elevated to 173/90, prompting her to [facility] evaluation given prior history of pre-eclampsia.   On arrival to the hospital BP was elevated to 150/86 and she was noted to be bradycardic to the 50s, otherwise VSS. Labs notable for K 3.7, Cr 0.82, Hgb 8.9, PLT 338, BNP 437.8>378.2, HsTrop 20>16. Flu/COVID negative. CXR showed mild cardiomegaly without pulmonary edema or other acute findings. EKG showed sinus rhythm with sinus arrhythmia, rate noted to be 50 bpm, though on my review is likely in the 60s, 1st degree AV block, no STE/D, no TWI - no significant change from EKG in 2019. She was diuresed with IV lasix 40mg  x2 doses. Cardiology asked to evaluate for bradycardia with AV block.   At the time of this evaluation she reports resolution of her headache/visual disturbance and improvement in her LE edema. She notes some pleuritic chest discomfort since delivery though no exertional chest pain. Over the past 2 days she has had intermittent lightheadedness though no dizziness, palpitations, or syncope. She denies family history of CAD, CHF, or PPM  placement. She denies tobacco abuse or illicit drug use.   Past Medical History:  Diagnosis Date  . Gestational diabetes 07/17/2019  . Hypertension in pregnancy, preeclampsia, severe, delivered/postpartum 10/09/2019  . PONV (postoperative nausea and vomiting)   . Pregnancy induced hypertension    #4    Past Surgical History:  Procedure Laterality Date  . CLAVICLE SURGERY    . INDUCED ABORTION    . LAPAROSCOPIC APPENDECTOMY N/A 10/25/2014   Procedure: APPENDECTOMY LAPAROSCOPIC;  Surgeon: Gaynelle AduEric Wilson, MD;  [facility];  Service: General;  Laterality: N/A;     Home Medications:  Prior to Admission medications   Medication Sig Start Date End Date Taking? Authorizing Provider  acetaminophen (TYLENOL) 325 MG tablet Take 2 tablets (650 mg total) by mouth every 6 (six) hours as needed for mild pain or moderate pain. 10/04/19  Yes Sheila OatsGoswick, Anna E, MD  Multiple Vitamin (MULTIVITAMIN WITH MINERALS) TABS tablet Take 1 tablet by mouth daily.   Yes [provider]  naproxen sodium (ALEVE) 220 MG tablet Take 220 mg by mouth.   Yes [provider]  coconut oil OIL Apply 1 application topically as needed (nipple pain). 10/04/19   Sheila OatsGoswick, Anna E, MD  ibuprofen (ADVIL) 600 MG tablet Take 1 tablet (600 mg total) by mouth every 8 (eight) hours as needed for moderate pain or cramping. 10/04/19   Sheila OatsGoswick, Anna E, MD    Inpatient Medications: Scheduled Meds: . enalapril  10 mg Oral Daily  . enoxaparin (LOVENOX) injection  40 mg Subcutaneous Q24H  . prenatal multivitamin  1 tablet Oral Q1200   Continuous  Infusions: . lactated ringers 75 mL/hr at 10/10/19 1350  . magnesium sulfate 2 g/hr (10/10/19 0500)   PRN Meds: alum & mag hydroxide-simeth, butalbital-acetaminophen-caffeine, ibuprofen, labetalol **AND** labetalol **AND** labetalol **AND** Measure blood pressure, ondansetron **OR** ondansetron (ZOFRAN) IV, simethicone  Allergies:    Allergies  Allergen Reactions  . Penicillins  Itching    Social History:   Social History   Socioeconomic History  . Marital status: Single    Spouse name: Not on file  . Number of children: 4  . Years of education: Not on file  . Highest education level: GED or equivalent  Occupational History  . Occupation: [employer] and gam[employer]  Tobacco Use  . Smoking status: Former Smoker    Packs/day: 0.25    Types: Cigarettes  . Smokeless tobacco: Former Clinical biochemist  . Vaping Use: Never used  Substance and Sexual Activity  . Alcohol use: Not Currently    Alcohol/week: 0.0 standard drinks    Comment: social  . Drug use: No  . Sexual activity: Yes    Partners: Male    Birth control/protection: None  Other Topics Concern  . Not on file  Social History Narrative  . Not on file   Social Determinants of Health   Financial Resource Strain:   . Difficulty of Paying Living Expenses: Not on file  Food Insecurity: No Food Insecurity  . Worried About Programme researcher, broadcasting/film/video in the Last Year: Never true  . Ran Out of Food in the Last Year: Never true  Transportation Needs: No Transportation Needs  . Lack of Transportation (Medical): No  . Lack of Transportation (Non-Medical): No  Physical Activity: Inactive  . Days of Exercise per Week: 0 days  . Minutes of Exercise per Session: 0 min  Stress: Stress Concern Present  . Feeling of Stress : Very much  Social Connections:   . Frequency of Communication with Friends and Family: Not on file  . Frequency of Social Gatherings with Friends and Family: Not on file  . Attends Religious Services: Not on file  . Active Member of Clubs or Organizations: Not on file  . Attends Banker Meetings: Not on file  . Marital Status: Not on file  Intimate Partner Violence: Not At Risk  . Fear of Current or Ex-Partner: No  . Emotionally Abused: No  . Physically Abused: No  . Sexually Abused: No    Family History:    Family History  Problem Relation Age of Onset  . Heart disease  Maternal Grandfather   . Kidney disease Maternal Grandfather        failure  . Healthy Mother   . Heart disease Father   . Diabetes Maternal Grandmother   . Hypertension Maternal Grandmother   . Colon cancer Neg Hx   . Stomach cancer Neg Hx   . Rectal cancer Neg Hx   . Esophageal cancer Neg Hx   . Liver cancer Neg Hx      ROS:  Please see the history of present illness.  Review of Systems  All other systems reviewed and are negative.   All other ROS reviewed and negative.     Physical Exam/Data:   Vitals:   10/10/19 1037 10/10/19 1100 10/10/19 1200 10/10/19 1346  BP:    115/73  Pulse:    70  Resp:  18 18 18   Temp:    97.8 F (36.6 C)  TempSrc:    Oral  SpO2:    100%  Weight:      Height: 5\' 3"  (1.6 m)       Intake/Output Summary (Last 24 hours) at 10/10/2019 1438 Last data filed at 10/10/2019 1400 Gross per 24 hour  Intake 2178.97 ml  Output 7075 ml  Net -4896.03 ml   Filed Weights   10/09/19 2312 10/10/19 0400  Weight: 97.2 kg 91.9 kg   Body mass index is 35.87 kg/m.  General:  Well nourished, well developed, in no acute distress HEENT: sclera anicteric  Neck: no JVD Vascular: No carotid bruits; distal pulses 2+ bilaterally Cardiac:  normal S1, S2; RRR; no murmurs, rubs, or gallops Lungs:  clear to auscultation bilaterally, no wheezing, rhonchi or rales  Abd: NABS, soft, nontender, no hepatomegaly Ext: no edema Musculoskeletal:  No deformities, BUE and BLE strength normal and equal Skin: warm and dry  Neuro:  CNs 2-12 intact, no focal abnormalities noted Psych:  Normal affect   EKG:  The EKG was personally reviewed and demonstrates:  sinus rhythm with sinus arrhythmia, rate noted to be 50 bpm, though on my review is likely in the 60s, 1st degree AV block, no STE/D, no TWI - no significant change from EKG in 2019 Telemetry:  Telemetry was personally reviewed and demonstrates:  Baseline rhythm is sinus with 1st degree AV block and sinus arrhythmia. From  2:30am-3am she did have some intermittent episodes of 2nd degree AV block type 1, otherwise no significant arrhythmias  Relevant CV Studies: None  Laboratory Data:  Chemistry Recent Labs  Lab 10/09/19 2323  NA 140  K 3.7  CL 109  CO2 21*  GLUCOSE 82  BUN 11  CREATININE 0.82  CALCIUM 8.8*  GFRNONAA >60  ANIONGAP 10    Recent Labs  Lab 10/09/19 2323  PROT 5.8*  ALBUMIN 3.0*  AST 39  ALT 41  ALKPHOS 91  BILITOT 0.6   Hematology Recent Labs  Lab 10/09/19 2323  WBC 7.4  RBC 3.03*  HGB 8.9*  HCT 28.1*  MCV 92.7  MCH 29.4  MCHC 31.7  RDW 15.4  PLT 338   Cardiac EnzymesNo results for input(s): TROPONINI in the last 168 hours. No results for input(s): TROPIPOC in the last 168 hours.  BNP Recent Labs  Lab 10/09/19 2346 10/10/19 0750  BNP 437.8* 378.2*    DDimer No results for input(s): DDIMER in the last 168 hours.  Radiology/Studies:  DG Chest 2 View  Result Date: 10/10/2019 CLINICAL DATA:  Initial evaluation for postpartum preeclampsia. EXAM: CHEST - 2 VIEW COMPARISON:  Prior radiograph from 02/25/2018. FINDINGS: Heart size appears mildly enlarged.  Mediastinal silhouette normal. Lungs well inflated. Minimal left basilar subsegmental atelectasis. No focal infiltrates. No pulmonary edema or pleural effusion. No pneumothorax. No acute osseous abnormality.  Mild sigmoid scoliosis noted. IMPRESSION: 1. Mild cardiomegaly without pulmonary edema. 2. Minimal left basilar subsegmental atelectasis. 3. No other active cardiopulmonary disease. Electronically Signed   By: 02/27/2018 M.D.   On: 10/10/2019 00:28    Assessment and Plan:   1. Bradycardia with 1st degree AV block and episodes of 2nd degree AV block type 1: EKG on admission showed rate 50 bpm though this is falsely low due to underlying arrhythmia - appears to be more likely in the 60s. Otherwise she had a 1st degree AV block which was present in 2019. Telemetry shows a brief period where she had  intermittent 2nd degree AV block type 1. No syncope.  - Will check an echocardiogram - Anticipate placing a zio patch  prior to discharge for 2 week outpatient monitoring.   2. Elevated troponin: HsTrop 20>16 - low flat trend is not c/w ACS. Suspect some demand ischemia in the setting of elevated blood pressure/pre-eclampsia - Will check an echocardiogram   3. Pre-eclampsia: she is 8 days post-partum. Presented with HA x3 days, elevated blood pressures, and LE edema. BNP was elevated to 437 and improved to 378 after receiving IV lasix 40mg  x2 doses. UOP is net -3.5L. Weight is down to 202lbs from 214lbs. LFTs/PLTs are wnl. Started on enalapril 10mg  daily. HA and visual disturbances improved.  - Continue management per primary team   For questions or updates, please contact [facility] Please consult [email or web address] for contact info under Cardiology/STEMI.   Signed, 03-27-2006, PA-C  10/10/2019 2:38 PM [phone]  Personally seen and examined. Agree with above.   29 year old approximately 1 week postpartum with preeclampsia, hypertension who had EKG with first-degree AV block transiently as well as occasional second-degree heart block type I, Wenckebach.  Asymptomatic.  No syncope.  Currently feeding her child.  Significant other in room.  Blood pressure under better control.  BNP improving from 432 370.  Received Lasix.  GEN: Well nourished, well developed, in no acute distress  HEENT: normal  Neck: no JVD, carotid bruits, or masses Cardiac: RRR; no murmurs, rubs, or gallops,no edema  Respiratory:  clear to auscultation bilaterally, normal work of breathing GI: soft, nontender, nondistended, + BS MS: no deformity or atrophy  Skin: warm and dry, no rash Neuro:  Alert and Oriented x 3, Strength and sensation are intact Psych: euthymic mood, full affect  Lab work reviewed as above.  Normal creatinine.  Assessment and plan:  Transient first-degree AV block, occasional  secondary heart block type I, Wenckebach -Overall benign.  No associated syncope. -Checking echocardiogram to ensure proper structure and function of her heart. -Avoid AV nodal blocking agents such as labetalol or metoprolol -We will send her home with a Zio patch monitor.  Preeclampsia/hypertension -Responded well to Lasix.  Good weight loss. -Enalapril.  Elevated troponin -Minimally elevated at 18, 20.  This is compatible with mild demand ischemia in the setting of hypertension.  Not acute coronary syndrome.  [phone], MD

## 2019-10-10 NOTE — Progress Notes (Signed)
Post Partum Day 8 Subjective: no complaints  Objective: Blood pressure 116/82, pulse 64, temperature 97.9 F (36.6 C), temperature source Oral, resp. rate 17, weight 91.9 kg, SpO2 94 %, unknown if currently breastfeeding.  Physical Exam:  General: alert, cooperative and no distress Lochia: appropriate Uterine Fundus: firm Incision:  DVT Evaluation: No evidence of DVT seen on physical exam.  Recent Labs    10/09/19 2323  HGB 8.9*  HCT 28.1*    Assessment/Plan: PPD#8 with postpartum pre eclampsia, severe features:  Headache and visual disturbance have resolved Normal LFTs and platelets Continue magnesium for 24 hours begun on vasotec 10 this am BP is improved  Bradycardia, sinus, new onset with first degree heart block on EKG, pulse currently normalized: Mild cardiomegaly on CXR, not unexpected in postpartum period, with no evidence of pulmonary edema. Giving 2nd dose of lasix since this is the peak of fluid shifts postpartum.   BNP, troponin mildly elevated will repeat this am Pulse now in the 60s.  Her pulse during hospitilaization during delivery was normal, 80s for the most part.  Cardiology consult today.     LOS: 0 days   Lazaro Arms 10/10/2019, 7:23 AM

## 2019-10-11 ENCOUNTER — Telehealth: Payer: Self-pay | Admitting: Radiology

## 2019-10-11 ENCOUNTER — Other Ambulatory Visit: Payer: Self-pay | Admitting: Physician Assistant

## 2019-10-11 ENCOUNTER — Observation Stay (HOSPITAL_BASED_OUTPATIENT_CLINIC_OR_DEPARTMENT_OTHER): Payer: Medicaid Other

## 2019-10-11 DIAGNOSIS — I517 Cardiomegaly: Secondary | ICD-10-CM

## 2019-10-11 DIAGNOSIS — I441 Atrioventricular block, second degree: Secondary | ICD-10-CM

## 2019-10-11 DIAGNOSIS — O9943 Diseases of the circulatory system complicating the puerperium: Secondary | ICD-10-CM | POA: Diagnosis not present

## 2019-10-11 DIAGNOSIS — O165 Unspecified maternal hypertension, complicating the puerperium: Secondary | ICD-10-CM

## 2019-10-11 DIAGNOSIS — R002 Palpitations: Secondary | ICD-10-CM

## 2019-10-11 DIAGNOSIS — I361 Nonrheumatic tricuspid (valve) insufficiency: Secondary | ICD-10-CM | POA: Diagnosis not present

## 2019-10-11 DIAGNOSIS — I440 Atrioventricular block, first degree: Secondary | ICD-10-CM | POA: Diagnosis not present

## 2019-10-11 DIAGNOSIS — O1415 Severe pre-eclampsia, complicating the puerperium: Secondary | ICD-10-CM | POA: Diagnosis not present

## 2019-10-11 DIAGNOSIS — R001 Bradycardia, unspecified: Secondary | ICD-10-CM | POA: Diagnosis not present

## 2019-10-11 LAB — ECHOCARDIOGRAM COMPLETE
Area-P 1/2: 2.39 cm2
Height: 63 in
S' Lateral: 3 cm
Weight: 3240 oz

## 2019-10-11 MED ORDER — ENALAPRIL MALEATE 10 MG PO TABS
10.0000 mg | ORAL_TABLET | Freq: Every day | ORAL | 2 refills | Status: DC
Start: 2019-10-12 — End: 2020-06-15

## 2019-10-11 NOTE — Telephone Encounter (Signed)
Enrolled patient for a 14 day Zio XT monitor to be mailed to patients home. Brief instructions were gone over with patient and she knows to expect the monitor to arrive in 3-4 days.  

## 2019-10-11 NOTE — Progress Notes (Signed)
Pt discharged to home with significant other.  Condition stable.  Per patient, cardiology monitor to be brought to her house.  Discharge instructions reviewed and patient verbalized understanding of follow-up appointments and medication, including to continue enalapril daily until instructed otherwise by provider.  Pt ambulated to car with NT.  No equipment for home ordered at discharge.

## 2019-10-11 NOTE — Progress Notes (Signed)
Post Partum Day 9 Subjective: Pt seen doing well.  She has completed magnesium sulfate therapy and blood pressures seem well controlled on enalapril.  Pt did complain about a hard knot on the right breast also evaluated by lactation.  Warm compresses were applied and the knot became less painful and decreased in size.  Objective: Blood pressure 111/78, pulse 65, temperature 98.8 F (37.1 C), temperature source Oral, resp. rate 18, height 5\' 3"  (1.6 m), weight 91.9 kg, SpO2 97 %, unknown if currently breastfeeding.  Physical Exam:  General: alert, cooperative and no distress Lochia: appropriate CV: RRR, pulse at 65 Resp:  Breathing with normal effort DVT Evaluation: No evidence of DVT seen on physical exam.  Maternal echo WNL  Recent Labs    10/09/19 2323  HGB 8.9*  HCT 28.1*    Assessment/Plan: Discharge home , Cardiology plans on outpt prolonged monitoring.  They will set this up   LOS: 0 days   12/09/19 10/11/2019, 12:31 PM

## 2019-10-11 NOTE — Discharge Summary (Signed)
Physician Discharge Summary  Patient ID: Pamela Schaefer MRN: [id number] DOB/AGE: 03-10-1990 28 y.o.  Admit date: 10/09/2019 Discharge date: 10/11/2019  Admission Diagnoses: postpartum preeclampsia, first degree heart block  Discharge Diagnoses:  Active Problems:   Hypertension in pregnancy, preeclampsia, severe, delivered/postpartum   Discharged Condition: good  Hospital Course: Pt was admitted with severe range pressures and headache for the prior 3 days.  She was postpartum day 6 at this time.  Bradycardia was noted during her initial exam.  She received 2 rounds of lasix and placed on magnesium sulfate for postpartum preeclampsia seizure prophylaxis.  Pt did well and blood pressure decreased appropriately with the enalapril.  Cardiology consult was sent and they felt she had secondary heart blockl type 1, Wenckebach.  Echocardiogram was ordered and found to be normal.  Cardiology advised outpatient monitoring for about 2 weeks which they will set up.  Pt was discharged without issue.  Consults: cardiology  Significant Diagnostic Studies: cardiac graphics: Echocardiogram: normal ejection fraction  Treatments: IV magnesium sulfate 24 hours, po enalapril  Discharge Exam: Blood pressure 111/78, pulse 65, temperature 98.8 F (37.1 C), temperature source Oral, resp. rate 18, height 5\' 3"  (1.6 m), weight 91.9 kg, SpO2 97 %, unknown if currently breastfeeding. General appearance: alert, cooperative and no distress Head: Normocephalic, without obvious abnormality, atraumatic Breasts: positive findings: no erythema, 3-5 cm palpable cord like mass extedning toward the nipple,  Cardio: regular rate and rhythm GI: soft, non-tender; bowel sounds normal; no masses,  no organomegaly  Disposition: Discharge disposition: 01-Home or Self Care       Discharge Instructions    Activity as tolerated   Complete by: As directed    Call MD for:   Complete by: As directed    Chest pain   Call  MD for:  temperature >100.4   Complete by: As directed    Diet - low sodium heart healthy   Complete by: As directed    Sexual activity   Complete by: As directed    Pelvic rest 4-6 weeks     Allergies as of 10/11/2019      Reactions   Penicillins Itching      Medication List    STOP taking these medications   naproxen sodium 220 MG tablet Commonly known as: ALEVE     TAKE these medications   acetaminophen 325 MG tablet Commonly known as: Tylenol Take 2 tablets (650 mg total) by mouth every 6 (six) hours as needed for mild pain or moderate pain.   coconut oil Oil Apply 1 application topically as needed (nipple pain).   enalapril 10 MG tablet Commonly known as: VASOTEC Take 1 tablet (10 mg total) by mouth daily. Start taking on: October 12, 2019   ibuprofen 600 MG tablet Commonly known as: ADVIL Take 1 tablet (600 mg total) by mouth every 8 (eight) hours as needed for moderate pain or cramping.   multivitamin with minerals Tabs tablet Take 1 tablet by mouth daily.       Follow-up Information    [facility]. Schedule an appointment as soon as possible for a visit in 1 week(s).   Specialty: Obstetrics and Gynecology Why: blood pressure check Contact information: [address] Pamela Schaefer Pamela Schaefer [phone]             Pt will have a zio patch or other extended monitoring sent to her home for cardiac follow up Signed: [phone] 10/11/2019, 12:48 PM

## 2019-10-11 NOTE — Discharge Instructions (Signed)
Preeclampsia and Eclampsia Preeclampsia is a serious condition that may develop during pregnancy. This condition causes high blood pressure and increased protein in your urine along with other symptoms, such as headaches and vision changes. These symptoms may develop as the condition gets worse. Preeclampsia may occur at 20 weeks of pregnancy or later. Diagnosing and treating preeclampsia early is very important. If not treated early, it can cause serious problems for you and your baby. One problem it can lead to is eclampsia. Eclampsia is a condition that causes muscle jerking or shaking (convulsions or seizures) and other serious problems for the mother. During pregnancy, delivering your baby may be the best treatment for preeclampsia or eclampsia. For most women, preeclampsia and eclampsia symptoms go away after giving birth. In rare cases, a woman may develop preeclampsia after giving birth (postpartum preeclampsia). This usually occurs within 48 hours after childbirth but may occur up to 6 weeks after giving birth. What are the causes? The cause of preeclampsia is not known. What increases the risk? The following risk factors make you more likely to develop preeclampsia:  Being pregnant for the first time.  Having had preeclampsia during a past pregnancy.  Having a family history of preeclampsia.  Having high blood pressure.  Being pregnant with more than one baby.  Being 35 or older.  Being African-American.  Having kidney disease or diabetes.  Having medical conditions such as lupus or blood diseases.  Being very overweight (obese). What are the signs or symptoms? The most common symptoms are:  Severe headaches.  Vision problems, such as blurred or double vision.  Abdominal pain, especially upper abdominal pain. Other symptoms that may develop as the condition gets worse include:  Sudden weight gain.  Sudden swelling of the hands, face, legs, and feet.  Severe nausea  and vomiting.  Numbness in the face, arms, legs, and feet.  Dizziness.  Urinating less than usual.  Slurred speech.  Convulsions or seizures. How is this diagnosed? There are no screening tests for preeclampsia. Your health care provider will ask you about symptoms and check for signs of preeclampsia during your prenatal visits. You may also have tests that include:  Checking your blood pressure.  Urine tests to check for protein. Your health care provider will check for this at every prenatal visit.  Blood tests.  Monitoring your baby's heart rate.  Ultrasound. How is this treated? You and your health care provider will determine the treatment approach that is best for you. Treatment may include:  Having more frequent prenatal exams to check for signs of preeclampsia, if you have an increased risk for preeclampsia.  Medicine to lower your blood pressure.  Staying in the hospital, if your condition is severe. There, treatment will focus on controlling your blood pressure and the amount of fluids in your body (fluid retention).  Taking medicine (magnesium sulfate) to prevent seizures. This may be given as an injection or through an IV.  Taking a low-dose aspirin during your pregnancy.  Delivering your baby early. You may have your labor started with medicine (induced), or you may have a cesarean delivery. Follow these instructions at home: Eating and drinking   Drink enough fluid to keep your urine pale yellow.  Avoid caffeine. Lifestyle  Do not use any products that contain nicotine or tobacco, such as cigarettes and e-cigarettes. If you need help quitting, ask your health care provider.  Do not use alcohol or drugs.  Avoid stress as much as possible. Rest and get   plenty of sleep. General instructions  Take over-the-counter and prescription medicines only as told by your health care provider.  When lying down, lie on your left side. This keeps pressure off your  major blood vessels.  When sitting or lying down, raise (elevate) your feet. Try putting some pillows underneath your lower legs.  Exercise regularly. Ask your health care provider what kinds of exercise are best for you.  Keep all follow-up and prenatal visits as told by your health care provider. This is important. How is this prevented? There is no known way of preventing preeclampsia or eclampsia from developing. However, to lower your risk of complications and detect problems early:  Get regular prenatal care. Your health care provider may be able to diagnose and treat the condition early.  Maintain a healthy weight. Ask your health care provider for help managing weight gain during pregnancy.  Work with your health care provider to manage any long-term (chronic) health conditions you have, such as diabetes or kidney problems.  You may have tests of your blood pressure and kidney function after giving birth.  Your health care provider may have you take low-dose aspirin during your next pregnancy. Contact a health care provider if:  You have symptoms that your health care provider told you may require more treatment or monitoring, such as: ? Headaches. ? Nausea or vomiting. ? Abdominal pain. ? Dizziness. ? Light-headedness. Get help right away if:  You have severe: ? Abdominal pain. ? Headaches that do not get better. ? Dizziness. ? Vision problems. ? Confusion. ? Nausea or vomiting.  You have any of the following: ? A seizure. ? Sudden, rapid weight gain. ? Sudden swelling in your hands, ankles, or face. ? Trouble moving any part of your body. ? Numbness in any part of your body. ? Trouble speaking. ? Abnormal bleeding.  You faint. Summary  Preeclampsia is a serious condition that may develop during pregnancy.  This condition causes high blood pressure and increased protein in your urine along with other symptoms, such as headaches and vision  changes.  Diagnosing and treating preeclampsia early is very important. If not treated early, it can cause serious problems for you and your baby.  Get help right away if you have symptoms that your health care provider told you to watch for. This information is not intended to replace advice given to you by your health care provider. Make sure you discuss any questions you have with your health care provider. Document Revised: 08/22/2017 Document Reviewed: 07/27/2015 Elsevier Patient Education  2020 [employer]  

## 2019-10-11 NOTE — Lactation Note (Addendum)
Lactation Consultation Note  Patient Name: Pamela Schaefer Date: 10/11/2019    Mom is being followed for [facility] and received Lasix x2. Lasix is considered an L3 medication but safe for breastfeeding at a low dose. Mom is on 40 mg BID. Very high dose in [place] listed as 4mg /kg BID.   Mom states her nipples are sensitive and she prefers not to latch at the breast. She has pumped x1 last night but states unable to pump consistently while on the medication. She is not feeling well and her appetite has diminished. LC stressed importance of Mom eating and drinking while pumping since she will burn additional calories.   Mom complains of an outer quadrant mass on the right breast starting at the nipple going outward towards the areolae in a longitudinal direction  5 cm. Feels rope like and she noticed in 1 week prior to delivery under the right breast. She states at times it appears to go down and then return and she has a weird sensation in that location during pumping.  LC applied heat and massage to the area. LC also able to express breast milk from that side. LC encouraged Mom to hand express and pump regularly q 3hours for 15 minutes using heat prior to pumping.  LC reviewed the findings with the RN, , and spoke to the provider, Dr. Raynelle Fanning.   Mom had some swelling around the areolae on the left side with tenderness related to flange. LC increased flange size to 27, comfort gels given with instructions on how to use them. LC will also ask RN to provide coconut oil for nipple care and prior to using the flanges when pumping. Mom is aware to not use coconut oil and comfort gels at the same time. To discard comfort gels after 6 days.   Plan 1. Mom to apply heat, breast massage and hand expression. Followed by pumping q 3hrs for 15 minutes.            2. Mom is both breast and bottle feeding infant.    LC returned after applying heat for 20 minutes. Right side mass decreased 4 cm. Mom  instructed to continue using heat and pumping consistently to relieve the plugged duct.  Provider, Dr. Donavan Foil, also examined the area noting no signs of dilated veins or redness.  Mom to be d/c on labetalol which is a L2 medication in the [place] book.  Pepe Mineau  Nicholson-Springer 10/11/2019, 12:07 PM

## 2019-10-11 NOTE — Progress Notes (Signed)
  Echocardiogram 2D Echocardiogram has been performed.  Pieter Partridge 10/11/2019, 9:14 AM

## 2019-10-11 NOTE — Progress Notes (Signed)
Progress Note  Patient Name: Pamela Schaefer Date of Encounter: 10/11/2019  [facility] Cardiologist: No primary care provider on file.   Subjective   Feeling well, no syncope, no chest pain, no shortness of breath  Inpatient Medications    Scheduled Meds: . enalapril  10 mg Oral Daily  . enoxaparin (LOVENOX) injection  40 mg Subcutaneous Q24H  . prenatal multivitamin  1 tablet Oral Q1200   Continuous Infusions: . lactated ringers Stopped (10/11/19 0000)   PRN Meds: alum & mag hydroxide-simeth, butalbital-acetaminophen-caffeine, ibuprofen, labetalol **AND** labetalol **AND** labetalol **AND** Measure blood pressure, ondansetron **OR** ondansetron (ZOFRAN) IV, simethicone   Vital Signs    Vitals:   10/10/19 2300 10/11/19 0003 10/11/19 0540 10/11/19 0922  BP:  117/73 113/72 111/78  Pulse:  68 62 65  Resp: 18 18 18 18   Temp:  98.5 F (36.9 C) 98.3 F (36.8 C) 98.8 F (37.1 C)  TempSrc:  Oral Oral Oral  SpO2:  96% 95% 97%  Weight:      Height:        Intake/Output Summary (Last 24 hours) at 10/11/2019 1244 Last data filed at 10/11/2019 0950 Gross per 24 hour  Intake 2199.94 ml  Output 950 ml  Net 1249.94 ml   Last 3 Weights 10/10/2019 10/09/2019 10/02/2019  Weight (lbs) 202 lb 8 oz 214 lb 3.2 oz 222 lb 12.8 oz  Weight (kg) 91.853 kg 97.16 kg 101.061 kg      Telemetry    Mostly sinus rhythm, occasional first-degree heart block, rare second-degree heart block type I- Personally Reviewed  ECG    No new- Personally Reviewed  Physical Exam   GEN: No acute distress.   Neck: No JVD Cardiac: RRR, no murmurs, rubs, or gallops.  Respiratory: Clear to auscultation bilaterally. GI: Soft, nontender, non-distended  MS: No edema; No deformity. Neuro:  Nonfocal  Psych: Normal affect   Labs    High Sensitivity Troponin:   Recent Labs  Lab 10/09/19 2347 10/10/19 0750  TROPONINIHS 20* 16      Chemistry Recent Labs  Lab 10/09/19 2323  NA 140  K 3.7    CL 109  CO2 21*  GLUCOSE 82  BUN 11  CREATININE 0.82  CALCIUM 8.8*  PROT 5.8*  ALBUMIN 3.0*  AST 39  ALT 41  ALKPHOS 91  BILITOT 0.6  GFRNONAA >60  ANIONGAP 10     Hematology Recent Labs  Lab 10/09/19 2323  WBC 7.4  RBC 3.03*  HGB 8.9*  HCT 28.1*  MCV 92.7  MCH 29.4  MCHC 31.7  RDW 15.4  PLT 338    BNP Recent Labs  Lab 10/09/19 2346 10/10/19 0750  BNP 437.8* 378.2*     DDimer No results for input(s): DDIMER in the last 168 hours.   Radiology    DG Chest 2 View  Result Date: 10/10/2019 CLINICAL DATA:  Initial evaluation for postpartum preeclampsia. EXAM: CHEST - 2 VIEW COMPARISON:  Prior radiograph from 02/25/2018. FINDINGS: Heart size appears mildly enlarged.  Mediastinal silhouette normal. Lungs well inflated. Minimal left basilar subsegmental atelectasis. No focal infiltrates. No pulmonary edema or pleural effusion. No pneumothorax. No acute osseous abnormality.  Mild sigmoid scoliosis noted. IMPRESSION: 1. Mild cardiomegaly without pulmonary edema. 2. Minimal left basilar subsegmental atelectasis. 3. No other active cardiopulmonary disease. Electronically Signed   By: 02/27/2018 M.D.   On: 10/10/2019 00:28   ECHOCARDIOGRAM COMPLETE  Result Date: 10/11/2019    ECHOCARDIOGRAM REPORT   Patient Name:  Pamela Schaefer Date of Exam: 10/11/2019 Medical Rec #:  [id number]         Height:       63.0 in Accession #:    [id number]        Weight:       202.5 lb Date of Birth:  1990/03/12         BSA:          1.944 m Patient Age:    29 years          BP:           113/72 mmHg Patient Gender: F                 HR:           62 bpm. Exam Location:  Inpatient Procedure: 2D Echo, Cardiac Doppler and Color Doppler Indications:    Cardiomegaly  History:        Patient has no prior history of Echocardiogram examinations.                 Arrythmias:bradycardia; Risk Factors:Hypertension and Diabetes.                 Pregnenacy induced HTN, pre-ecampsia.  Sonographer:     Lavenia Atlas Referring Phys: [id number] Beatriz Stallion IMPRESSIONS  1. Left ventricular ejection fraction, by estimation, is 60 to 65%. The left ventricle has normal function. The left ventricle has no regional wall motion abnormalities. Left ventricular diastolic parameters were normal.  2. Right ventricular systolic function is normal. The right ventricular size is normal. Mildly increased right ventricular wall thickness.  3. The mitral valve is normal in structure. Trivial mitral valve regurgitation. No evidence of mitral stenosis.  4. The aortic valve is normal in structure. Aortic valve regurgitation is not visualized. No aortic stenosis is present. FINDINGS  Left Ventricle: Left ventricular ejection fraction, by estimation, is 60 to 65%. The left ventricle has normal function. The left ventricle has no regional wall motion abnormalities. The left ventricular internal cavity size was normal in size. There is  no left ventricular hypertrophy. Left ventricular diastolic parameters were normal. Right Ventricle: The right ventricular size is normal. Mildly increased right ventricular wall thickness. Right ventricular systolic function is normal. Left Atrium: Left atrial size was normal in size. Right Atrium: Right atrial size was normal in size. Pericardium: There is no evidence of pericardial effusion. Mitral Valve: The mitral valve is normal in structure. Trivial mitral valve regurgitation. No evidence of mitral valve stenosis. Tricuspid Valve: The tricuspid valve is normal in structure. Tricuspid valve regurgitation is mild . No evidence of tricuspid stenosis. Aortic Valve: The aortic valve is normal in structure. Aortic valve regurgitation is not visualized. No aortic stenosis is present. Pulmonic Valve: The pulmonic valve was normal in structure. Pulmonic valve regurgitation is not visualized. No evidence of pulmonic stenosis. Aorta: The aortic root and ascending aorta are structurally normal, with no  evidence of dilitation. IAS/Shunts: The atrial septum is grossly normal.  LEFT VENTRICLE PLAX 2D LVIDd:         4.50 cm  Diastology LVIDs:         3.00 cm  LV e' medial:    7.40 cm/s LV PW:         1.10 cm  LV E/e' medial:  8.6 LV IVS:        1.10 cm  LV e' lateral:   10.30 cm/s LVOT diam:     2.20  cm  LV E/e' lateral: 6.2 LV SV:         67 LV SV Index:   34 LVOT Area:     3.80 cm  RIGHT VENTRICLE RV Basal diam:  3.80 cm RV S prime:     11.00 cm/s TAPSE (M-mode): 3.5 cm LEFT ATRIUM             Index       RIGHT ATRIUM           Index LA diam:        3.50 cm 1.80 cm/m  RA Area:     14.70 cm LA Vol (A2C):   45.8 ml 23.56 ml/m RA Volume:   37.20 ml  19.14 ml/m LA Vol (A4C):   45.8 ml 23.56 ml/m LA Biplane Vol: 45.9 ml 23.61 ml/m  AORTIC VALVE LVOT Vmax:   83.60 cm/s LVOT Vmean:  57.700 cm/s LVOT VTI:    0.175 m  AORTA Ao Root diam: 2.80 cm MITRAL VALVE               TRICUSPID VALVE MV Area (PHT): 2.39 cm    TR Peak grad:   26.0 mmHg MV Decel Time: 318 msec    TR Vmax:        255.00 cm/s MV E velocity: 63.80 cm/s MV A velocity: 51.00 cm/s  SHUNTS MV E/A ratio:  1.25        Systemic VTI:  0.18 m                            Systemic Diam: 2.20 cm Kristeen Miss MD Electronically signed by Kristeen Miss MD Signature Date/Time: 10/11/2019/10:40:06 AM    Final     Cardiac Studies   Echo-normal  Patient Profile     29 y.o. 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 -Arrhythmias seen are benign. -Okay for discharge from cardiology perspective.  To complete the work-up, we will send her out a Zio patch monitor to wear at home.  Elevated troponin -Mild demand ischemia in the setting of hypertensive urgency on  admission.  Preeclampsia -Had magnesium protocol.      For questions or updates, please contact [facility] Please consult [email or web address] for contact info under        Signed, Donato Schultz, MD  10/11/2019, 12:44 PM

## 2019-10-17 ENCOUNTER — Other Ambulatory Visit (INDEPENDENT_AMBULATORY_CARE_PROVIDER_SITE_OTHER): Payer: Medicaid Other

## 2019-10-17 DIAGNOSIS — R002 Palpitations: Secondary | ICD-10-CM

## 2019-11-11 NOTE — Telephone Encounter (Signed)
Pt called in and would like to know if someone could call her with the results from the heart monitor   Best number [phone]

## 2019-11-11 NOTE — Telephone Encounter (Signed)
Returned call to pt and made her aware that we haven't got her monitor results as of yet, that it's only been 3 business days and it can take anywhere from 5-7, but as soon as they are sent, someone will call her. She was grateful for the call back.

## 2019-11-14 ENCOUNTER — Ambulatory Visit: Payer: Medicaid Other | Admitting: Obstetrics and Gynecology

## 2019-11-14 ENCOUNTER — Ambulatory Visit (INDEPENDENT_AMBULATORY_CARE_PROVIDER_SITE_OTHER): Payer: Medicaid Other | Admitting: Obstetrics and Gynecology

## 2019-11-14 ENCOUNTER — Other Ambulatory Visit: Payer: Self-pay

## 2019-11-14 ENCOUNTER — Encounter: Payer: Self-pay | Admitting: Obstetrics and Gynecology

## 2019-11-14 DIAGNOSIS — O99893 Other specified diseases and conditions complicating puerperium: Secondary | ICD-10-CM

## 2019-11-14 DIAGNOSIS — O24410 Gestational diabetes mellitus in pregnancy, diet controlled: Secondary | ICD-10-CM

## 2019-11-14 DIAGNOSIS — R12 Heartburn: Secondary | ICD-10-CM

## 2019-11-14 MED ORDER — OMEPRAZOLE 20 MG PO CPDR: 20.0000 mg | DELAYED_RELEASE_CAPSULE | Freq: Every day | ORAL | 3 refills | Status: DC

## 2019-11-14 NOTE — Progress Notes (Signed)
Post Partum Visit Note  Pamela Schaefer is a 29 y.o. [id number] female who presents for a postpartum visit. She is 6 weeks postpartum following a normal spontaneous vaginal delivery.  I have fully reviewed the prenatal and intrapartum course. The delivery was at 40.1 gestational weeks.  Anesthesia: none. Postpartum course has been complicated with cardiac issues and severe preeclampsia. She has worn a heart monitor; awaiting results. She is not taking HTN meds that were prescribed at discharge. Baby is doing well. Baby is feeding by bottle - [name]. Bleeding no bleeding. Bowel function is normal. Bladder function is normal. Patient is sexually active; last unprotected 1 week ago. Contraception method is none. Postpartum depression screening: negative.   The pregnancy intention screening data noted above was reviewed. Potential methods of contraception were discussed. The patient elected to proceed with IUD or IUS.    Edinburgh Postnatal Depression Scale - 11/14/19 0907      Edinburgh Postnatal Depression Scale:  In the Past 7 Days   I have been able to laugh and see the funny side of things. 0    I have looked forward with enjoyment to things. 0    I have blamed myself unnecessarily when things went wrong. 0    I have been anxious or worried for no good reason. 0    I have felt scared or panicky for no good reason. 0    Things have been getting on top of me. 0    I have been so unhappy that I have had difficulty sleeping. 0    I have felt sad or miserable. 0    I have been so unhappy that I have been crying. 0    The thought of harming myself has occurred to me. 0    Edinburgh Postnatal Depression Scale Total 0           The following portions of the patient's history were reviewed and updated as appropriate: allergies, current medications, past family history, past medical history, past social history, past surgical history and problem list.  Review of  Systems Constitutional: negative Eyes: negative Ears, nose, mouth, throat, and face: negative Respiratory: negative Cardiovascular: negative Gastrointestinal: positive for reflux symptoms Genitourinary:negative Integument/breast: negative Hematologic/lymphatic: negative Musculoskeletal:negative Neurological: negative Behavioral/Psych: negative Endocrine: negative Allergic/Immunologic: negative    Objective:  Blood pressure 126/80, pulse 80, temperature 98.4 F (36.9 C), temperature source Oral, height 5\' 3"  (1.6 m), weight 195 lb 12.8 oz (88.8 kg), not currently breastfeeding.  General:  alert, cooperative and no distress   Breasts:  inspection negative, no nipple discharge or bleeding, no masses or nodularity palpable  Lungs: clear to auscultation bilaterally  Heart:  awaiting heart monitor results from Heart Care provider  Abdomen: soft, non-tender; bowel sounds normal; no masses,  no organomegaly   Vulva:  not evaluated  Vagina: not evaluated  Cervix:  not evaluated  Corpus: not examined  Adnexa:  not evaluated  Rectal Exam: Not performed.        Assessment:  Encounter for routine postpartum follow-up - Normal postpartum exam. Pap smear not done at today's visit. Plan pap for prior to IUD insertion. Diet controlled gestational diabetes mellitus (GDM) in third trimester  - Glucose tolerance, 2 hours  Heartburn  - Rx for omeprazole (PRILOSEC) 20 MG capsule   Plan:   Essential components of care per ACOG recommendations:  1.  Mood and well being: Patient with negative depression screening today. Reviewed local resources for support.  -  Patient does not use tobacco.  - hx of drug use? No    2. Infant care and feeding:  -Patient currently breastmilk feeding? No  -Social determinants of health (SDOH) reviewed in EPIC. No concerns  3. Sexuality, contraception and birth spacing - Patient does not want a pregnancy in the next year.  Desired family size is 5 children.   - Reviewed forms of contraception in tiered fashion. Patient desired IUD today.   - Discussed birth spacing of 18 months  4. Sleep and fatigue -Encouraged family/partner/community support of 4 hrs of uninterrupted sleep to help with mood and fatigue  5. Physical Recovery  - Discussed patients delivery and complications - Patient had a no laceration. Patient expressed understanding - Patient has urinary incontinence? No - Patient is safe to resume physical and sexual activity  6.  Health Maintenance - Last pap smear done 11/07/2016 and was normal.   7. Chronic Disease - PCP follow up  Raelyn Mora, CNM [facility] [employer], [facility]

## 2019-11-15 LAB — GLUCOSE TOLERANCE, 2 HOURS
Glucose, 2 hour: 58 mg/dL — ABNORMAL LOW (ref 65–139)
Glucose, GTT - Fasting: 88 mg/dL (ref 65–99)

## 2019-11-18 NOTE — Progress Notes (Signed)
Reviewed heart monitor. She denies dizziness, lightheadedness, and syncope. She stopped taking vasotec 2 weeks ago and reports that her BP is "good." She wants recommendations on stopping the medication. I asked her to record BP three times each week and call us if her pressure starts increasing. She expressed understanding of the plan. She will call our office with questions, concerns, or elevated BP.

## 2019-11-27 ENCOUNTER — Encounter: Payer: Self-pay | Admitting: Obstetrics and Gynecology

## 2019-11-27 ENCOUNTER — Other Ambulatory Visit (HOSPITAL_COMMUNITY)
Admission: RE | Admit: 2019-11-27 | Discharge: 2019-11-27 | Disposition: A | Discharge status: Home / Self Care | Payer: Medicaid Other | Source: Ambulatory Visit | Attending: Obstetrics and Gynecology | Admitting: Obstetrics and Gynecology

## 2019-11-27 ENCOUNTER — Other Ambulatory Visit: Payer: Self-pay

## 2019-11-27 ENCOUNTER — Ambulatory Visit (INDEPENDENT_AMBULATORY_CARE_PROVIDER_SITE_OTHER): Payer: Medicaid Other | Admitting: Obstetrics and Gynecology

## 2019-11-27 VITALS — BP 124/76 | HR 74 | Temp 98.0°F | Ht 63.0 in | Wt 192.4 lb

## 2019-11-27 DIAGNOSIS — Z01419 Encounter for gynecological examination (general) (routine) without abnormal findings: Secondary | ICD-10-CM | POA: Diagnosis not present

## 2019-11-27 DIAGNOSIS — Z3041 Encounter for surveillance of contraceptive pills: Secondary | ICD-10-CM

## 2019-11-27 DIAGNOSIS — Z124 Encounter for screening for malignant neoplasm of cervix: Secondary | ICD-10-CM

## 2019-11-27 LAB — POCT URINE PREGNANCY: Preg Test, Ur: NEGATIVE

## 2019-11-27 MED ORDER — NORGESTIMATE-ETH ESTRADIOL 0.25-35 MG-MCG PO TABS: 1.0000 | ORAL_TABLET | Freq: Every day | ORAL | 11 refills | Status: DC

## 2019-11-27 NOTE — Progress Notes (Signed)
   WELL-WOMAN PHYSICAL & PAP Patient name: Pamela Schaefer MRN [id number]  Date of birth: [birth year] Chief Complaint:   Contraception and Gynecologic Exam  History of Present Illness:   Pamela Schaefer is a 29 y.o. [phone] African American female being seen today for a routine well-woman exam.  Current complaints: still spotting from period that started 11/20/2019.  PCP: none      does not desire labs Patient's last menstrual period was 11/20/2019 (exact date). The current method of family planning is none.  Last pap 11/07/2016. Results were: normal Last mammogram: n/a.  Last colonoscopy: n/a.  Review of Systems:   Pertinent items are noted in HPI Denies any headaches, blurred vision, fatigue, shortness of breath, chest pain, abdominal pain, abnormal vaginal discharge/itching/odor/irritation, problems with periods, bowel movements, urination, or intercourse unless otherwise stated above. Pertinent History Reviewed:  Reviewed past medical,surgical, social and family history.  Reviewed problem list, medications and allergies. Physical Assessment:   Vitals:   11/27/19 1517  BP: 124/76  Pulse: 74  Temp: 98 F (36.7 C)  TempSrc: Oral  Weight: 192 lb 6.4 oz (87.3 kg)  Height: 5\' 3"  (1.6 m)  Body mass index is 34.08 kg/m.        Physical Examination:   General appearance - well appearing, and in no distress  Mental status - alert, oriented to person, place, and time  Psych:  She has a normal mood and affect  Skin - warm and dry, normal color, no suspicious lesions noted  Chest - effort normal, all lung fields clear to auscultation bilaterally  Heart - normal rate and regular rhythm  Neck:  midline trachea, no thyromegaly or nodules  Breasts - breasts appear normal, no suspicious masses, no skin or nipple changes or  axillary nodes  Abdomen - soft, nontender, nondistended, no masses or organomegaly  Pelvic - VULVA: normal appearing vulva with no masses, tenderness or lesions    VAGINA: normal appearing vagina with normal color and discharge, no lesions  CERVIX: normal appearing cervix without discharge or lesions, no CMT  Thin prep pap is done with reflex HR HPV cotesting  UTERUS: uterus is felt to be normal size, shape, consistency and nontender   ADNEXA: No adnexal masses or tenderness noted.  Rectal - deferred  Extremities:  No swelling or varicosities noted  Results for orders placed or performed in visit on 11/27/19 (from the past 24 hour(s))  POCT urine pregnancy   Collection Time: 11/27/19  3:18 PM  Result Value Ref Range   Preg Test, Ur Negative Negative    Assessment & Plan:  1) Well woman exam with routine gynecological exam - Cytology - PAP( [facility])  2) Encounter for surveillance of contraceptive pills  - POCT urine pregnancy,  - Rx for norgestimate-ethinyl estradiol (ORTHO-CYCLEN) 0.25-35 MG-MCG tablet  Labs/procedures today: pap  Mammogram at age 65 or sooner if problems Colonoscopy at age 72 or sooner if problems  Orders Placed This Encounter  Procedures  . POCT urine pregnancy    Meds:  Meds ordered this encounter  Medications  . norgestimate-ethinyl estradiol (ORTHO-CYCLEN) 0.25-35 MG-MCG tablet    Sig: Take 1 tablet by mouth daily.    Dispense:  28 tablet    Refill:  11    Order Specific Question:   Supervising Provider    Answer:   09-19-1998 [[id number]]    Follow-up: No follow-ups on file.  Reva Bores MSN, CNM 11/27/2019 3:33 PM

## 2019-12-03 LAB — CYTOLOGY - PAP: Diagnosis: NEGATIVE

## 2020-01-09 ENCOUNTER — Ambulatory Visit: Payer: Medicaid Other | Admitting: Obstetrics and Gynecology

## 2020-03-05 ENCOUNTER — Other Ambulatory Visit: Payer: Self-pay | Admitting: Obstetrics and Gynecology

## 2020-03-06 ENCOUNTER — Other Ambulatory Visit: Payer: Self-pay | Admitting: Obstetrics and Gynecology

## 2020-03-06 DIAGNOSIS — Z3041 Encounter for surveillance of contraceptive pills: Secondary | ICD-10-CM

## 2020-03-06 MED ORDER — NORETHIN-ETH ESTRAD-FE BIPHAS 1 MG-10 MCG / 10 MCG PO TABS: 1.0000 | ORAL_TABLET | Freq: Every day | ORAL | 11 refills | Status: DC

## 2020-03-06 NOTE — Progress Notes (Signed)
Rx sent and patient notified via My Chart.

## 2020-03-31 ENCOUNTER — Telehealth: Payer: Self-pay | Admitting: Obstetrics and Gynecology

## 2020-03-31 NOTE — Telephone Encounter (Signed)
Received a call from Ms. Hankins asking if we saw patients for things other than pregnancy. I informed her she could be seen for other female issues, but not primary health. I ask her if that is what she wanted, and she replied yes. I told her I could transfer her to the [facility], ans she agreed. Then she called back to speak to [facility]. She asked me to give her a message to call her back when she comes back in the office.

## 2020-04-13 ENCOUNTER — Other Ambulatory Visit: Payer: Self-pay

## 2020-04-13 ENCOUNTER — Encounter (HOSPITAL_COMMUNITY): Payer: Self-pay

## 2020-04-13 ENCOUNTER — Ambulatory Visit (HOSPITAL_COMMUNITY)
Admission: EM | Admit: 2020-04-13 | Discharge: 2020-04-13 | Disposition: A | Discharge status: Home / Self Care | Payer: Managed Care, Other (non HMO) | Attending: Physician Assistant | Admitting: Physician Assistant

## 2020-04-13 DIAGNOSIS — L710 Perioral dermatitis: Secondary | ICD-10-CM

## 2020-04-13 MED ORDER — ERYTHROMYCIN 2 % EX GEL: Freq: Two times a day (BID) | CUTANEOUS | 0 refills | Status: DC

## 2020-04-13 NOTE — ED Triage Notes (Signed)
Pt in with c/o rash above her lip that has been there for a few weeks  Pt states the rash hurts really bad and bleeds sometimes when she wipes her nose  Pt has applied cortisone cream with no relief

## 2020-04-13 NOTE — ED Provider Notes (Signed)
[facility]    CSN: [id number] Arrival date & time: 04/13/20  1845      History   Chief Complaint Chief Complaint  Patient presents with  . Rash    HPI Pamela Schaefer is a 30 y.o. female.   Pt complains of a rash above her lip that started several weeks ago.  She reports itching and that it is somewhat painful.  She has applied cortisone cream with no improvement.  She has applied coconut oil with some relief of dryness.  Denies rash anywhere else. Denies new makeup, creams, lotions.   Pt also reports she has been experiencing increased anxiety lately and requests information regarding providers to possible prescribe medication for this.  She is not currently feeling anxious, but often feels anxious around large crowds of people and even reports not wanting to leave her house as much.  Denies depressed mood. She has been trying meditation and has just started to exercise more.      Past Medical History:  Diagnosis Date  . Gestational diabetes 07/17/2019  . Hypertension in pregnancy, preeclampsia, severe, delivered/postpartum 10/09/2019  . PONV (postoperative nausea and vomiting)   . Pregnancy induced hypertension    #4    Patient Active Problem List   Diagnosis Date Noted  . Hypertension in pregnancy, preeclampsia, severe, delivered/postpartum 10/09/2019  . Indication for care in labor or delivery 10/02/2019  . Gestational diabetes 07/17/2019  . Penicillin allergy 04/17/2019  . Obesity affecting pregnancy in second trimester 04/17/2019  . History of gestational hypertension 04/17/2019  . Supervision of other normal pregnancy, antepartum 04/08/2019  . Intrauterine pregnancy 03/31/2019  . Acute appendicitis 10/25/2014  . Anxiety 11/15/2013  . Vaginal delivery 12/08/2012  . GERD without esophagitis 07/31/2012    Past Surgical History:  Procedure Laterality Date  . CLAVICLE SURGERY    . INDUCED ABORTION    . LAPAROSCOPIC APPENDECTOMY N/A 10/25/2014    Procedure: APPENDECTOMY LAPAROSCOPIC;  Surgeon: Gaynelle Adu, MD;  Location: [facility] OR;  Service: General;  Laterality: N/A;    OB History    Gravida  7   Para  5   Term  5   Preterm  0   AB  2   Living  5     SAB  1   IAB  1   Ectopic  0   Multiple  0   Live Births  5        Obstetric Comments  Pre E w/#4         Home Medications    Prior to Admission medications   Medication Sig Start Date End Date Taking? Authorizing Provider  erythromycin with ethanol (ERYGEL) 2 % gel Apply topically 2 (two) times daily. 04/13/20  Yes Nishika Parkhurst, PA-C  acetaminophen (TYLENOL) 325 MG tablet Take 2 tablets (650 mg total) by mouth every 6 (six) hours as needed for mild pain or moderate pain. Patient not taking: Reported on 11/14/2019 10/04/19   Sheila Oats, MD  coconut oil OIL Apply 1 application topically as needed (nipple pain). Patient not taking: Reported on 11/14/2019 10/04/19   Sheila Oats, MD  enalapril (VASOTEC) 10 MG tablet Take 1 tablet (10 mg total) by mouth daily. Patient not taking: Reported on 11/14/2019 10/12/19   Warden Fillers, MD  ibuprofen (ADVIL) 600 MG tablet Take 1 tablet (600 mg total) by mouth every 8 (eight) hours as needed for moderate pain or cramping. Patient not taking: Reported on 11/14/2019 10/04/19  Sheila Oats, MD  Multiple Vitamin (MULTIVITAMIN WITH MINERALS) TABS tablet Take 1 tablet by mouth daily. Patient not taking: Reported on 11/14/2019    [provider]  Norethindrone-Ethinyl Estradiol-Fe Biphas (LO LOESTRIN FE) 1 MG-10 MCG / 10 MCG tablet Take 1 tablet by mouth daily. 03/06/20   Raelyn Mora, CNM  omeprazole (PRILOSEC) 20 MG capsule Take 1 capsule (20 mg total) by mouth daily. 11/14/19   Raelyn Mora, CNM    Family History Family History  Problem Relation Age of Onset  . Heart disease Maternal Grandfather   . Kidney disease Maternal Grandfather        failure  . Healthy Mother   . Heart disease Father    . Diabetes Maternal Grandmother   . Hypertension Maternal Grandmother   . Colon cancer Neg Hx   . Stomach cancer Neg Hx   . Rectal cancer Neg Hx   . Esophageal cancer Neg Hx   . Liver cancer Neg Hx     Social History Social History   Tobacco Use  . Smoking status: Former Smoker    Packs/day: 0.25    Types: Cigarettes  . Smokeless tobacco: Former Clinical biochemist  . Vaping Use: Never used  Substance Use Topics  . Alcohol use: Not Currently    Alcohol/week: 0.0 standard drinks    Comment: social  . Drug use: No     Allergies   Penicillins   Review of Systems Review of Systems  Constitutional: Negative for chills and fever.  HENT: Negative for ear pain and sore throat.   Eyes: Negative for pain and visual disturbance.  Respiratory: Negative for cough and shortness of breath.   Cardiovascular: Negative for chest pain and palpitations.  Gastrointestinal: Negative for abdominal pain and vomiting.  Genitourinary: Negative for dysuria and hematuria.  Musculoskeletal: Negative for arthralgias and back pain.  Skin: Positive for rash. Negative for color change.  Neurological: Negative for seizures and syncope.  Psychiatric/Behavioral: Negative for agitation, behavioral problems, confusion, self-injury and suicidal ideas. The patient is nervous/anxious.   All other systems reviewed and are negative.    Physical Exam Triage Vital Signs ED Triage Vitals  Enc Vitals Group     BP 04/13/20 1912 126/87     Pulse Rate 04/13/20 1912 (!) 119     Resp 04/13/20 1912 18     Temp 04/13/20 1912 98.7 F (37.1 C)     Temp src --      SpO2 04/13/20 1912 100 %     Weight --      Height --      Head Circumference --      Peak Flow --      Pain Score 04/13/20 1911 0     Pain Loc --      Pain Edu? --      Excl. in GC? --    No data found.  Updated Vital Signs BP 126/87   Pulse (!) 119   Temp 98.7 F (37.1 C)   Resp 18   LMP 04/05/2020 (Exact Date)   SpO2 100%    Breastfeeding No   Visual Acuity Right Eye Distance:   Left Eye Distance:   Bilateral Distance:    Right Eye Near:   Left Eye Near:    Bilateral Near:     Physical Exam Vitals and nursing note reviewed.  Constitutional:      General: She is not in acute distress.    Appearance: She is  well-developed.  HENT:     Head: Normocephalic and atraumatic.  Eyes:     Conjunctiva/sclera: Conjunctivae normal.  Cardiovascular:     Rate and Rhythm: Normal rate and regular rhythm.     Heart sounds: No murmur heard.   Pulmonary:     Effort: Pulmonary effort is normal. No respiratory distress.     Breath sounds: Normal breath sounds.  Abdominal:     Palpations: Abdomen is soft.     Tenderness: There is no abdominal tenderness.  Musculoskeletal:     Cervical back: Neck supple.  Skin:    General: Skin is warm and dry.     Comments: Perioral maculopapular rash with mild, no crusting, dry.   Neurological:     Mental Status: She is alert.      UC Treatments / Results  Labs (all labs ordered are listed, but only abnormal results are displayed) Labs Reviewed - No data to display  EKG   Radiology No results found.  Procedures Procedures (including critical care time)  Medications Ordered in UC Medications - No data to display  Initial Impression / Assessment and Plan / UC Course  I have reviewed the triage vital signs and the nursing notes.  Pertinent labs & imaging results that were available during my care of the patient were reviewed by me and considered in my medical decision making (see chart for details).  Clinical Course as of 04/13/20 1950  Mon Apr 13, 2020  1949 Recheck of heart rate in 60s [JZ]    Clinical Course User Index [JZ] Jodell Cipro, PA-C    Rash consistent with perioral dermatitis. Topical erythromycin prescribed.    PCP information given.  PCP assistance requested for anxiety management.  Final Clinical Impressions(s) / UC Diagnoses   Final  diagnoses:  Perioral dermatitis     Discharge Instructions     Use topical gel twice a day.    ED Prescriptions    Medication Sig Dispense Auth. Provider   erythromycin with ethanol (ERYGEL) 2 % gel Apply topically 2 (two) times daily. 30 g Jodell Cipro, PA-C     PDMP not reviewed this encounter.   Jodell Cipro, PA-C 04/13/20 1951

## 2020-04-13 NOTE — Discharge Instructions (Signed)
Use topical gel twice a day.

## 2020-04-14 ENCOUNTER — Telehealth (HOSPITAL_COMMUNITY): Payer: Self-pay | Admitting: Emergency Medicine

## 2020-04-14 MED ORDER — METRONIDAZOLE 0.75 % EX LOTN: 1.0000 "application " | TOPICAL_LOTION | Freq: Two times a day (BID) | CUTANEOUS | 0 refills | Status: DC

## 2020-04-14 NOTE — Telephone Encounter (Signed)
Patient called and states Medicaid does not cover previous prescription.  Provider sent in new prescription, and patient will return call if not covered

## 2020-04-16 ENCOUNTER — Ambulatory Visit (HOSPITAL_BASED_OUTPATIENT_CLINIC_OR_DEPARTMENT_OTHER): Payer: Managed Care, Other (non HMO) | Admitting: Nurse Practitioner

## 2020-05-13 ENCOUNTER — Encounter (HOSPITAL_BASED_OUTPATIENT_CLINIC_OR_DEPARTMENT_OTHER): Payer: Self-pay | Admitting: Nurse Practitioner

## 2020-05-25 ENCOUNTER — Ambulatory Visit (HOSPITAL_BASED_OUTPATIENT_CLINIC_OR_DEPARTMENT_OTHER): Payer: Managed Care, Other (non HMO) | Admitting: Family Medicine

## 2020-06-15 ENCOUNTER — Encounter (HOSPITAL_BASED_OUTPATIENT_CLINIC_OR_DEPARTMENT_OTHER): Payer: Self-pay | Admitting: Family Medicine

## 2020-06-15 ENCOUNTER — Ambulatory Visit (INDEPENDENT_AMBULATORY_CARE_PROVIDER_SITE_OTHER): Payer: Managed Care, Other (non HMO) | Admitting: Family Medicine

## 2020-06-15 ENCOUNTER — Other Ambulatory Visit: Payer: Self-pay

## 2020-06-15 DIAGNOSIS — F53 Postpartum depression: Secondary | ICD-10-CM | POA: Diagnosis not present

## 2020-06-15 DIAGNOSIS — D649 Anemia, unspecified: Secondary | ICD-10-CM

## 2020-06-15 DIAGNOSIS — E669 Obesity, unspecified: Secondary | ICD-10-CM

## 2020-06-15 DIAGNOSIS — E66811 Obesity, class 1: Secondary | ICD-10-CM | POA: Insufficient documentation

## 2020-06-15 DIAGNOSIS — O99345 Other mental disorders complicating the puerperium: Secondary | ICD-10-CM | POA: Diagnosis not present

## 2020-06-15 MED ORDER — SERTRALINE HCL 25 MG PO TABS: 25.0000 mg | ORAL_TABLET | Freq: Every day | ORAL | 0 refills | Status: DC

## 2020-06-15 NOTE — Assessment & Plan Note (Signed)
Elevated BMI in office today Did discuss importance of lifestyle modifications, working towards gradual healthy weight loss to reduce BMI Discussed that she is at increased risk of developing hypertension or diabetes in the future given issues with both during past pregnancies Offered referral to nutritionist for further discussion of lifestyle modifications, patient interested, will consider

## 2020-06-15 NOTE — Assessment & Plan Note (Signed)
On review of labs, patient with notable anemia, most recent CBC was 8 months ago Plan to repeat CBC at future office visit to assess current status

## 2020-06-15 NOTE — Progress Notes (Signed)
New Patient Office Visit  Subjective:  Patient ID: Pamela Schaefer, female    DOB: 1990/03/05  Age: 30 y.o. MRN: [id number]  CC:  Chief Complaint  Patient presents with   Establish Care    Est Care - No prior PCP   Anxiety    Patient states she is very anxious and worried all the time. 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 She has 5 children the youngest being 81 mos old. She feels like this is different from any PPD she's had in the past and is concerned. Patient states she was seen in the ED and had to wear a heart monitor and was told she had an "irregular heart beat" and to find a PCP   Depression    Patient presents today with concerns of possible PPD. She states she has dealt with PPD in the past but not as severe.    HPI Pamela Schaefer is a 30 year old female presenting to establish in clinic.  She has current concerns today related to an symptoms of depression/anxiety.  She reports past medical history significant for gestational diabetes, preeclampsia.  Depressive symptoms: Reports that during the postpartum period from her most recent pregnancy, she has had decreased interest in doing things, has felt down and depressed.  She also reports increased tiredness, poor eating habits.  She gave birth about 8 months ago.  Recalls that she may have had some postpartum blues with prior pregnancies but nothing significant she has been having now.  Most recent birth was her fifth child.  She denies any past medical history significant for anxiety depression when she was younger, no treatments in the past.  She denies any thoughts of harming herself or anyone else.  She is not currently breast-feeding  Patient is currently working part-time at [employer].  She picked a off hours shift that allows for her to not be around as many people; she notes that around larger groups of people, crowds she tends to feel more anxious.  Past Medical History:  Diagnosis Date   Gestational diabetes 07/17/2019    Hypertension in pregnancy, preeclampsia, severe, delivered/postpartum 10/09/2019   PONV (postoperative nausea and vomiting)    Pregnancy induced hypertension    #4    Past Surgical History:  Procedure Laterality Date   CLAVICLE SURGERY     INDUCED ABORTION     LAPAROSCOPIC APPENDECTOMY N/A 10/25/2014   Procedure: APPENDECTOMY LAPAROSCOPIC;  Surgeon: Gaynelle Adu, MD;  Location[facility] OR;  Service: General;  Laterality: N/A;    Family History  Problem Relation Age of Onset   Heart disease Maternal Grandfather    Kidney disease Maternal Grandfather        failure   Healthy Mother    Heart disease Father    Diabetes Maternal Grandmother    Hypertension Maternal Grandmother    Colon cancer Neg Hx    Stomach cancer Neg Hx    Rectal cancer Neg Hx    Esophageal cancer Neg Hx    Liver cancer Neg Hx     Social History   Socioeconomic History   Marital status: Single    Spouse name: Not on file   Number of children: 4   Years of education: Not on file   Highest education level: GED or equivalent  Occupational History   Occupation: [employer] gam[employer]    Comment: [employer] Part time  Tobacco Use   Smoking status: Former    Packs/day: 0.25  Pack years: 0.00    Types: Cigarettes   Smokeless tobacco: Former  Building services engineer Use: Never used  Substance and Sexual Activity   Alcohol use: Yes    Comment: social   Drug use: No   Sexual activity: Yes    Partners: Male    Birth control/protection: None  Other Topics Concern   Not on file  Social History Narrative   Not on file   Social Determinants of Health   Financial Resource Strain: Not on file  Food Insecurity: Not on file  Transportation Needs: Not on file  Physical Activity: Not on file  Stress: Not on file  Social Connections: Not on file  Intimate Partner Violence: Not on file    Objective:   Today's Vitals: BP 108/70   Pulse 63   Ht 5\' 3"  (1.6 m)   Wt 191 lb 12.8 oz (87 kg)   LMP 05/29/2020    SpO2 97%   Breastfeeding No   BMI 33.98 kg/m   Physical Exam  30 year old female in no acute distress Cardiovascular exam with regular rate and rhythm, no murmurs appreciated Lungs clear to auscultation bilaterally  Assessment & Plan:   Problem List Items Addressed This Visit       Other   Postpartum depression    Symptoms consistent with depression with development in postpartum period as she just gave birth a month ago Discussed treatment options with patient, she would like to proceed with both pharmacotherapy and counseling Will initiate sertraline 25 mg once daily, discussed potential side effects, cautioned that if she should develop any thoughts of harming yourself or anyone else that she should contact the clinic or present to the emergency room Referral placed to psychology Plan to follow-up in 2 weeks regarding pharmacotherapy, side effects Consider increasing dose of sertraline to 50 mg at next visit       Obesity (BMI 30.0-34.9)    Elevated BMI in office today Did discuss importance of lifestyle modifications, working towards gradual healthy weight loss to reduce BMI Discussed that she is at increased risk of developing hypertension or diabetes in the future given issues with both during past pregnancies Offered referral to nutritionist for further discussion of lifestyle modifications, patient interested, will consider       Anemia    On review of labs, patient with notable anemia, most recent CBC was 8 months ago Plan to repeat CBC at future office visit to assess current status        Outpatient Encounter Medications as of 06/15/2020  Medication Sig   erythromycin with ethanol (ERYGEL) 2 % gel Apply topically 2 (two) times daily.   METRONIDAZOLE, TOPICAL, 0.75 % LOTN Apply 1 application topically in the morning and at bedtime.   omeprazole (PRILOSEC) 20 MG capsule Take 1 capsule (20 mg total) by mouth daily.   [DISCONTINUED] acetaminophen (TYLENOL) 325 MG  tablet Take 2 tablets (650 mg total) by mouth every 6 (six) hours as needed for mild pain or moderate pain. (Patient not taking: Reported on 11/14/2019)   [DISCONTINUED] coconut oil OIL Apply 1 application topically as needed (nipple pain). (Patient not taking: Reported on 11/14/2019)   [DISCONTINUED] enalapril (VASOTEC) 10 MG tablet Take 1 tablet (10 mg total) by mouth daily. (Patient not taking: Reported on 11/14/2019)   [DISCONTINUED] ibuprofen (ADVIL) 600 MG tablet Take 1 tablet (600 mg total) by mouth every 8 (eight) hours as needed for moderate pain or cramping. (Patient not taking: Reported on 11/14/2019)   [  DISCONTINUED] Multiple Vitamin (MULTIVITAMIN WITH MINERALS) TABS tablet Take 1 tablet by mouth daily. (Patient not taking: Reported on 11/14/2019)   [DISCONTINUED] Norethindrone-Ethinyl Estradiol-Fe Biphas (LO LOESTRIN FE) 1 MG-10 MCG / 10 MCG tablet Take 1 tablet by mouth daily.   No facility-administered encounter medications on file as of 06/15/2020.    Follow-up: Return in about 2 weeks (around 06/29/2020) for Follow Up.  Assess response to sertraline at next visit, consider proceeding with labs including CBC, CMP, TSH, lipid profile, A1c.  Nahome Bublitz J De Peru, MD

## 2020-06-15 NOTE — Assessment & Plan Note (Signed)
Symptoms consistent with depression with development in postpartum period as she just gave birth a month ago Discussed treatment options with patient, she would like to proceed with both pharmacotherapy and counseling Will initiate sertraline 25 mg once daily, discussed potential side effects, cautioned that if she should develop any thoughts of harming yourself or anyone else that she should contact the clinic or present to the emergency room Referral placed to psychology Plan to follow-up in 2 weeks regarding pharmacotherapy, side effects Consider increasing dose of sertraline to 50 mg at next visit

## 2020-06-15 NOTE — Patient Instructions (Signed)
  Medication Instructions:  Your physician has recommended you make the following change in your medication: -- START Sertraline 25 mg - Take 1 tablet (25 mg) by mouth daily --If you need a refill on any your medications before your next appointment, please call your pharmacy first. If no refills are authorized on file call the office.--  Referrals/Procedures/Imaging: A referral has been placed for you to Dr. Bosie Clos with [facility]. Dr. Bosie Clos is a psychologist who specializes in cognitive and behavioral therapy, he does not prescribe medications. Someone from the scheduling department will be in contact with you in regards to coordinating your consultation. If you do not hear from any of the schedulers within 7-10 business days please give our office a call  Follow-Up: Your next appointment:   Your physician recommends that you schedule a follow-up appointment in: 2 WEEKS with Dr. de Peru  Thanks for letting us be apart of your health journey!!  Primary Care and Sports Medicine   Dr. de Peru and Shawna Clamp, DNP, AGNP  We recommend signing up for the patient portal called "MyChart".  Sign up information is provided on this After Visit Summary.  MyChart is used to connect with patients for Virtual Visits (Telemedicine).  Patients are able to view lab/test results, encounter notes, upcoming appointments, etc.  Non-urgent messages can be sent to your provider as well.   To learn more about what you can do with MyChart, please visit --  [email or web address].

## 2020-06-29 ENCOUNTER — Ambulatory Visit (HOSPITAL_BASED_OUTPATIENT_CLINIC_OR_DEPARTMENT_OTHER): Payer: Managed Care, Other (non HMO) | Admitting: Family Medicine

## 2020-07-08 ENCOUNTER — Encounter (HOSPITAL_BASED_OUTPATIENT_CLINIC_OR_DEPARTMENT_OTHER): Payer: Self-pay | Admitting: Family Medicine

## 2020-07-15 ENCOUNTER — Other Ambulatory Visit (HOSPITAL_BASED_OUTPATIENT_CLINIC_OR_DEPARTMENT_OTHER): Payer: Self-pay | Admitting: Family Medicine

## 2020-07-15 DIAGNOSIS — F53 Postpartum depression: Secondary | ICD-10-CM

## 2020-08-01 ENCOUNTER — Other Ambulatory Visit (HOSPITAL_BASED_OUTPATIENT_CLINIC_OR_DEPARTMENT_OTHER): Payer: Self-pay | Admitting: Family Medicine

## 2020-08-01 DIAGNOSIS — O99345 Other mental disorders complicating the puerperium: Secondary | ICD-10-CM

## 2020-08-01 DIAGNOSIS — F53 Postpartum depression: Secondary | ICD-10-CM

## 2020-08-03 NOTE — Telephone Encounter (Signed)
Pharmacy is requesting a 90 day supply on medication Patient has not scheduled follow up as recommended Further maintenance refills cannot be extended until patient completes follow up.

## 2020-09-16 ENCOUNTER — Encounter: Payer: Self-pay | Admitting: Obstetrics and Gynecology

## 2020-11-01 ENCOUNTER — Other Ambulatory Visit: Payer: Self-pay | Admitting: Advanced Practice Midwife

## 2020-11-01 DIAGNOSIS — O24419 Gestational diabetes mellitus in pregnancy, unspecified control: Secondary | ICD-10-CM

## 2020-12-09 ENCOUNTER — Ambulatory Visit: Payer: Managed Care, Other (non HMO) | Admitting: Obstetrics and Gynecology

## 2020-12-10 ENCOUNTER — Emergency Department (HOSPITAL_COMMUNITY)
Admission: EM | Admit: 2020-12-10 | Discharge: 2020-12-11 | Disposition: A | Discharge status: Home / Self Care | Payer: Managed Care, Other (non HMO) | Attending: Emergency Medicine | Admitting: Emergency Medicine

## 2020-12-10 ENCOUNTER — Other Ambulatory Visit: Payer: Self-pay

## 2020-12-10 DIAGNOSIS — S4992XA Unspecified injury of left shoulder and upper arm, initial encounter: Secondary | ICD-10-CM | POA: Diagnosis present

## 2020-12-10 DIAGNOSIS — Z87891 Personal history of nicotine dependence: Secondary | ICD-10-CM | POA: Diagnosis not present

## 2020-12-10 DIAGNOSIS — R12 Heartburn: Secondary | ICD-10-CM

## 2020-12-10 DIAGNOSIS — Y92410 Unspecified street and highway as the place of occurrence of the external cause: Secondary | ICD-10-CM | POA: Diagnosis not present

## 2020-12-10 DIAGNOSIS — R0789 Other chest pain: Secondary | ICD-10-CM | POA: Diagnosis not present

## 2020-12-10 DIAGNOSIS — S46912A Strain of unspecified muscle, fascia and tendon at shoulder and upper arm level, left arm, initial encounter: Secondary | ICD-10-CM | POA: Diagnosis not present

## 2020-12-11 ENCOUNTER — Emergency Department (HOSPITAL_COMMUNITY): Payer: Managed Care, Other (non HMO)

## 2020-12-11 ENCOUNTER — Encounter (HOSPITAL_COMMUNITY): Payer: Self-pay | Admitting: Emergency Medicine

## 2020-12-11 ENCOUNTER — Other Ambulatory Visit: Payer: Self-pay

## 2020-12-11 DIAGNOSIS — S46912A Strain of unspecified muscle, fascia and tendon at shoulder and upper arm level, left arm, initial encounter: Secondary | ICD-10-CM | POA: Diagnosis not present

## 2020-12-11 MED ORDER — METHOCARBAMOL 500 MG PO TABS: 500.0000 mg | ORAL_TABLET | Freq: Two times a day (BID) | ORAL | 0 refills | Status: DC

## 2020-12-11 MED ORDER — OMEPRAZOLE 20 MG PO CPDR: 20.0000 mg | DELAYED_RELEASE_CAPSULE | Freq: Every day | ORAL | 0 refills | Status: DC
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
## 2020-12-11 NOTE — Discharge Instructions (Addendum)
Robaxin as needed as prescribed for muscle spasms.  Do not take and drive or operate machinery. Take Motrin and Tylenol as needed as directed for pain. Warm compresses to your shoulder for 20 minutes at a time. Follow-up with your doctor for pain not improving in 3 to 5 days.
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
## 2020-12-15 ENCOUNTER — Telehealth (HOSPITAL_BASED_OUTPATIENT_CLINIC_OR_DEPARTMENT_OTHER): Payer: Self-pay

## 2020-12-15 NOTE — Telephone Encounter (Signed)
Spoke with patient regarding NSF. Patient does not recall missing two appts. Informed patient that she was able to speak with our front desk staff and an MA when making the second appt and patient is agreeable to accepting the second NSF. Please waive first NSF

## 2020-12-23 ENCOUNTER — Encounter (HOSPITAL_BASED_OUTPATIENT_CLINIC_OR_DEPARTMENT_OTHER): Payer: Self-pay | Admitting: Family Medicine

## 2020-12-23 ENCOUNTER — Ambulatory Visit (HOSPITAL_BASED_OUTPATIENT_CLINIC_OR_DEPARTMENT_OTHER): Payer: Managed Care, Other (non HMO) | Admitting: Family Medicine

## 2020-12-23 ENCOUNTER — Ambulatory Visit (INDEPENDENT_AMBULATORY_CARE_PROVIDER_SITE_OTHER): Payer: Managed Care, Other (non HMO) | Admitting: Family Medicine

## 2020-12-23 ENCOUNTER — Other Ambulatory Visit: Payer: Self-pay

## 2020-12-23 DIAGNOSIS — M25512 Pain in left shoulder: Secondary | ICD-10-CM

## 2020-12-23 DIAGNOSIS — M62838 Other muscle spasm: Secondary | ICD-10-CM | POA: Insufficient documentation

## 2020-12-23 NOTE — Patient Instructions (Addendum)
°  Medication Instructions:  Your physician recommends that you continue on your current medications as directed. Please refer to the Current Medication list given to you today. --If you need a refill on any your medications before your next appointment, please call your pharmacy first. If no refills are authorized on file call the office.--  Referrals/Procedures/Imaging: A referral has been placed for you to [facility] Physical therapy for evaluation and treatment. Someone from the scheduling department will be in contact with you in regards to coordinating your consultation. If you do not hear from any of the schedulers within 7-10 business days please give their office a call.  Follow-Up: Your next appointment:   Your physician recommends that you schedule a follow-up appointment in: 6 WEEKS with Dr. de Peru  You will receive a text message or e-mail with a link to a survey about your care and experience with [place] today! We would greatly appreciate your feedback!   Thanks for letting us be apart of your health journey!!  Primary Care and Sports Medicine   Dr. Ceasar Mons [place]   We encourage you to activate your patient portal called "MyChart".  Sign up information is provided on this After Visit Summary.  MyChart is used to connect with patients for Virtual Visits (Telemedicine).  Patients are able to view lab/test results, encounter notes, upcoming appointments, etc.  Non-urgent messages can be sent to your provider as well. To learn more about what you can do with MyChart, please visit --  [email or web address].
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
## 2020-12-24 ENCOUNTER — Ambulatory Visit: Payer: Managed Care, Other (non HMO) | Admitting: Obstetrics and Gynecology

## 2020-12-24 ENCOUNTER — Ambulatory Visit (HOSPITAL_COMMUNITY)
Admission: EM | Admit: 2020-12-24 | Discharge: 2020-12-24 | Disposition: A | Discharge status: Home / Self Care | Payer: Managed Care, Other (non HMO) | Attending: Emergency Medicine | Admitting: Emergency Medicine

## 2020-12-24 ENCOUNTER — Other Ambulatory Visit: Payer: Self-pay

## 2020-12-24 ENCOUNTER — Encounter (HOSPITAL_COMMUNITY): Payer: Self-pay | Admitting: Emergency Medicine

## 2020-12-24 DIAGNOSIS — M542 Cervicalgia: Secondary | ICD-10-CM | POA: Diagnosis not present

## 2020-12-24 MED ORDER — PREDNISONE 20 MG PO TABS: 40.0000 mg | ORAL_TABLET | Freq: Every day | ORAL | 0 refills | Status: DC

## 2020-12-24 MED ORDER — KETOROLAC TROMETHAMINE 30 MG/ML IJ SOLN
INTRAMUSCULAR | Status: AC
  Filled 2020-12-24: qty 1

## 2020-12-24 MED ORDER — KETOROLAC TROMETHAMINE 30 MG/ML IJ SOLN
30.0000 mg | Freq: Once | INTRAMUSCULAR | Status: AC
  Administered 2020-12-24: 15:00:00 30 mg via INTRAMUSCULAR
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
## 2020-12-24 NOTE — Discharge Instructions (Addendum)
Your pain is most likely caused by irritation to the muscles or ligaments.   Beginning tomorrow you may take prednisone every morning with food for the next 5 days  Please continue use of muscle relaxers and Tylenol  You may use heating pad in 15 minute intervals as needed for additional comfort  Begin stretching affected area daily for 10 minutes as tolerated to further loosen muscles   When lying down place pillow underneath and between knees for support  Can try sleeping without pillow on firm mattress   Practice good posture: head back, shoulders back, chest forward, pelvis back and weight distributed evenly on both legs  If pain persist after recommended treatment or reoccurs if may be beneficial to follow up with orthopedic specialist for evaluation, this doctor specializes in the bones and can manage your symptoms long-term with options such as but not limited to imaging, medications or physical therapy
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
## 2020-12-30 ENCOUNTER — Other Ambulatory Visit: Payer: Self-pay

## 2020-12-30 ENCOUNTER — Ambulatory Visit: Payer: Medicaid Other | Attending: Family Medicine

## 2020-12-30 DIAGNOSIS — M62838 Other muscle spasm: Secondary | ICD-10-CM | POA: Diagnosis not present

## 2020-12-30 DIAGNOSIS — M6281 Muscle weakness (generalized): Secondary | ICD-10-CM | POA: Diagnosis present

## 2020-12-30 DIAGNOSIS — M542 Cervicalgia: Secondary | ICD-10-CM | POA: Diagnosis not present

## 2020-12-30 DIAGNOSIS — M25512 Pain in left shoulder: Secondary | ICD-10-CM

## 2021-01-07 ENCOUNTER — Ambulatory Visit: Payer: Managed Care, Other (non HMO)

## 2021-01-12 ENCOUNTER — Ambulatory Visit: Payer: Managed Care, Other (non HMO)

## 2021-01-19 ENCOUNTER — Ambulatory Visit: Payer: Managed Care, Other (non HMO)

## 2021-01-21 ENCOUNTER — Ambulatory Visit: Payer: Managed Care, Other (non HMO) | Admitting: Obstetrics and Gynecology

## 2021-01-26 ENCOUNTER — Ambulatory Visit: Payer: Managed Care, Other (non HMO)

## 2021-02-02 ENCOUNTER — Ambulatory Visit (HOSPITAL_BASED_OUTPATIENT_CLINIC_OR_DEPARTMENT_OTHER): Payer: Managed Care, Other (non HMO) | Admitting: Family Medicine

## 2021-02-23 ENCOUNTER — Encounter (HOSPITAL_COMMUNITY): Payer: Self-pay | Admitting: Emergency Medicine

## 2021-02-23 ENCOUNTER — Other Ambulatory Visit: Payer: Self-pay

## 2021-02-23 ENCOUNTER — Emergency Department (HOSPITAL_COMMUNITY)
Admission: EM | Admit: 2021-02-23 | Discharge: 2021-02-23 | Disposition: A | Discharge status: Home / Self Care | Payer: Medicaid Other | Attending: Emergency Medicine | Admitting: Emergency Medicine

## 2021-02-23 DIAGNOSIS — R000 Tachycardia, unspecified: Secondary | ICD-10-CM | POA: Insufficient documentation

## 2021-02-23 DIAGNOSIS — D72829 Elevated white blood cell count, unspecified: Secondary | ICD-10-CM | POA: Diagnosis not present

## 2021-02-23 DIAGNOSIS — R0602 Shortness of breath: Secondary | ICD-10-CM | POA: Diagnosis not present

## 2021-02-23 DIAGNOSIS — N939 Abnormal uterine and vaginal bleeding, unspecified: Secondary | ICD-10-CM | POA: Insufficient documentation

## 2021-02-23 LAB — COMPREHENSIVE METABOLIC PANEL
ALT: 10 U/L (ref 0–44)
AST: 15 U/L (ref 15–41)
Albumin: 3.2 g/dL — ABNORMAL LOW (ref 3.5–5.0)
Alkaline Phosphatase: 33 U/L — ABNORMAL LOW (ref 38–126)
Anion gap: 9 (ref 5–15)
BUN: 7 mg/dL (ref 6–20)
CO2: 25 mmol/L (ref 22–32)
Calcium: 8.3 mg/dL — ABNORMAL LOW (ref 8.9–10.3)
Chloride: 105 mmol/L (ref 98–111)
Creatinine, Ser: 0.68 mg/dL (ref 0.44–1.00)
GFR, Estimated: >60 mL/min (ref >60)
Glucose, Bld: 112 mg/dL — ABNORMAL HIGH (ref 70–99)
Potassium: 3.9 mmol/L (ref 3.5–5.1)
Sodium: 139 mmol/L (ref 135–145)
Total Bilirubin: 0.6 mg/dL (ref 0.3–1.2)
Total Protein: 5.7 g/dL — ABNORMAL LOW (ref 6.5–8.1)

## 2021-02-23 LAB — I-STAT CHEM 8, ED
BUN: 8 mg/dL (ref 6–20)
Calcium, Ion: 1.23 mmol/L (ref 1.15–1.40)
Chloride: 106 mmol/L (ref 98–111)
Creatinine, Ser: 0.6 mg/dL (ref 0.44–1.00)
Glucose, Bld: 108 mg/dL — ABNORMAL HIGH (ref 70–99)
HCT: 35 % — ABNORMAL LOW (ref 36.0–46.0)
Hemoglobin: 11.9 g/dL — ABNORMAL LOW (ref 12.0–15.0)
Potassium: 4.1 mmol/L (ref 3.5–5.1)
Sodium: 140 mmol/L (ref 135–145)
TCO2: 25 mmol/L (ref 22–32)

## 2021-02-23 LAB — CBC WITH DIFFERENTIAL/PLATELET
Abs Immature Granulocytes: 0.05 10*3/uL (ref 0.00–0.07)
Basophils Absolute: 0 10*3/uL (ref 0.0–0.1)
Basophils Relative: 0 %
Eosinophils Absolute: 0.1 10*3/uL (ref 0.0–0.5)
Eosinophils Relative: 1 %
HCT: 33.1 % — ABNORMAL LOW (ref 36.0–46.0)
Hemoglobin: 10.3 g/dL — ABNORMAL LOW (ref 12.0–15.0)
Immature Granulocytes: 0 %
Lymphocytes Relative: 17 %
Lymphs Abs: 2.1 10*3/uL (ref 0.7–4.0)
MCH: 28.7 pg (ref 26.0–34.0)
MCHC: 31.1 g/dL (ref 30.0–36.0)
MCV: 92.2 fL (ref 80.0–100.0)
Monocytes Absolute: 0.6 10*3/uL (ref 0.1–1.0)
Monocytes Relative: 5 %
Neutro Abs: 9.2 10*3/uL — ABNORMAL HIGH (ref 1.7–7.7)
Neutrophils Relative %: 77 %
Platelets: 244 10*3/uL (ref 150–400)
RBC: 3.59 MIL/uL — ABNORMAL LOW (ref 3.87–5.11)
RDW: 14.3 % (ref 11.5–15.5)
WBC: 12 10*3/uL — ABNORMAL HIGH (ref 4.0–10.5)
nRBC: 0 % (ref 0.0–0.2)

## 2021-02-23 LAB — HCG, SERUM, QUALITATIVE: Preg, Serum: POSITIVE — AB

## 2021-02-23 LAB — TYPE AND SCREEN
ABO/RH(D): O POS
Antibody Screen: NEGATIVE

## 2021-02-23 LAB — LIPASE, BLOOD: Lipase: 21 U/L (ref 11–51)

## 2021-02-23 MED ORDER — FENTANYL CITRATE PF 50 MCG/ML IJ SOSY
50.0000 ug | PREFILLED_SYRINGE | Freq: Once | INTRAMUSCULAR | Status: AC
  Administered 2021-02-23: 50 ug via INTRAVENOUS
  Filled 2021-02-23: qty 1

## 2021-02-23 MED ORDER — MISOPROSTOL 200 MCG PO TABS
800.0000 ug | ORAL_TABLET | Freq: Once | ORAL | Status: AC
  Administered 2021-02-23: 800 ug via ORAL
  Filled 2021-02-23: qty 4

## 2021-02-23 MED ORDER — MISOPROSTOL 200 MCG PO TABS: 400.0000 ug | ORAL_TABLET | Freq: Three times a day (TID) | ORAL | 0 refills | Status: DC

## 2021-02-23 MED ORDER — SODIUM CHLORIDE 0.9 % IV BOLUS
1000.0000 mL | Freq: Once | INTRAVENOUS | Status: AC
  Administered 2021-02-23: 1000 mL via INTRAVENOUS

## 2021-02-23 MED ORDER — OXYCODONE-ACETAMINOPHEN 5-325 MG PO TABS: 1.0000 | ORAL_TABLET | Freq: Four times a day (QID) | ORAL | 0 refills | Status: AC | PRN

## 2021-02-23 NOTE — Discharge Instructions (Addendum)
You were prescribed medication to help with your bleeding.  Please take two tablet three times a day for the 3 days.   I have also prescribed medication to help with pain control, please take this medication for severe pain.

## 2021-02-23 NOTE — ED Triage Notes (Addendum)
Pt BIB [facility] from home. [facility] given [facility]ol for an abortion. Pt has been heavily bleeding since Saturday, passing quarter sized clots. Pt called EMS because since 0800 this morning because she is soaking about 5-6 pads in an hour. Pt hypotensive and orthostatic for EMS. 82/40 given 500 mls of NS and improved to 99/62. HR 90s, CBG 136. NAD at this time.

## 2021-02-23 NOTE — ED Provider Notes (Addendum)
[facility] EMERGENCY DEPARTMENT Provider Note   CSN: [id number] Arrival date & time: 02/23/21  1001     History  Chief Complaint  Patient presents with   Vaginal Bleeding    Pamela Schaefer is a 31 y.o. female.  31 y.o female G9P5A3 was [redacted] weeks pregnant presented to the [facility] for elective abortion 3 days ago. Patient was given methotrexate.  She reports bleeding began Saturday night, this has continued to worsen.  This morning, patient woke up with severe lower abdominal cramping, also having worsening vaginal bleeding.  She reports standing up and noting feeling lightheaded, dizzy.  Is also endorsing some shortness of breath that occurs with any activity. She called EMS, who found her to be hypotensive with a systolic pressure of 82/40, given 500 normal saline for fluid resuscitation.  She arrived in the ED with a blood pressure of 95/60, given 1 L bolus.  Reports prior history of abortions, did not have any prior complications.  No prior history of bleeding disorder, no prior history of anemia.    The history is provided by the patient.  Vaginal Bleeding Quality:  Bright red Severity:  Severe Onset quality:  Gradual Duration:  3 days Timing:  Constant Progression:  Worsening Chronicity:  New Menstrual history:  Irregular Number of pads used:  5-6 in an hour Context: spontaneously   Relieved by:  Nothing Worsened by:  Position and activity Ineffective treatments:  None tried Associated symptoms: abdominal pain   Associated symptoms: no back pain, no dizziness, no dysuria, no fatigue, no fever, no nausea and no vaginal discharge   Risk factors: terminated pregnancy   Risk factors: no bleeding disorder, no hx of ectopic pregnancy, no hx of endometriosis, no prior miscarriage and does not have unprotected sex       Home Medications Prior to Admission medications   Medication Sig Start Date End Date Taking? Authorizing Provider   misoprostol (CYTOTEC) 200 MCG tablet Take 2 tablets (400 mcg total) by mouth 3 (three) times daily for 3 days. 02/23/21 02/26/21 Yes Shimika Ames, Leonie Douglas, PA-C  oxyCODONE-acetaminophen (PERCOCET/ROXICET) 5-325 MG tablet Take 1 tablet by mouth every 6 (six) hours as needed for up to 3 days for severe pain. 02/23/21 02/26/21 Yes Daegen Berrocal, Leonie Douglas, PA-C  erythromycin with ethanol (ERYGEL) 2 % gel Apply topically 2 (two) times daily. 04/13/20   Ward, Tylene Fantasia, PA-C  methocarbamol (ROBAXIN) 500 MG tablet Take 1 tablet (500 mg total) by mouth 2 (two) times daily. 12/11/20   Jeannie Fend, PA-C  METRONIDAZOLE, TOPICAL, 0.75 % LOTN Apply 1 application topically in the morning and at bedtime. 04/14/20   Ward, Tylene Fantasia, PA-C  omeprazole (PRILOSEC) 20 MG capsule Take 1 capsule (20 mg total) by mouth daily. 12/11/20   Jeannie Fend, PA-C  predniSONE (DELTASONE) 20 MG tablet Take 2 tablets (40 mg total) by mouth daily. 12/24/20   White, Elita Boone, NP  sertraline (ZOLOFT) 25 MG tablet TAKE 1 TABLET (25 MG TOTAL) BY MOUTH DAILY. 07/17/20   de [place], Raymond J, MD      Allergies    Penicillins    Review of Systems   Review of Systems  Constitutional:  Negative for fatigue and fever.  Respiratory:  Positive for shortness of breath.   Cardiovascular:  Negative for chest pain.  Gastrointestinal:  Positive for abdominal pain. Negative for nausea and vomiting.  Genitourinary:  Positive for vaginal bleeding. Negative for dysuria and vaginal discharge.  Musculoskeletal:  Negative for back pain.  Neurological:  Negative for dizziness.  All other systems reviewed and are negative.  Physical Exam Updated Vital Signs BP 103/63    Pulse 66    Temp 98.4 F (36.9 C) (Oral)    Resp 20    Ht 5\' 3"  (1.6 m)    Wt 89 kg    SpO2 100%    BMI 34.76 kg/m  Physical Exam Vitals and nursing note reviewed. Exam conducted with a chaperone present.  Constitutional:      Appearance: She is ill-appearing.  HENT:     Head: Normocephalic and  atraumatic.  Eyes:     Comments: Conjunctiva is pale.   Cardiovascular:     Rate and Rhythm: Tachycardia present.  Pulmonary:     Effort: Pulmonary effort is normal.     Breath sounds: No wheezing.  Abdominal:     General: Abdomen is flat.     Tenderness: There is abdominal tenderness.  Genitourinary:    Exam position: Supine.     Vagina: Bleeding present. No lesions.     Cervix: Cervical bleeding present. No erythema.     Comments: Unable to fully visualize her cervix due to significant amount of bright red bleeding along with clots. Chaperoned by RN.  Musculoskeletal:     Cervical back: Normal range of motion and neck supple.  Skin:    General: Skin is warm and dry.  Neurological:     Mental Status: She is alert and oriented to person, place, and time.    ED Results / Procedures / Treatments   Labs (all labs ordered are listed, but only abnormal results are displayed) Labs Reviewed  CBC WITH DIFFERENTIAL/PLATELET - Abnormal; Notable for the following components:      Result Value   WBC 12.0 (*)    RBC 3.59 (*)    Hemoglobin 10.3 (*)    HCT 33.1 (*)    Neutro Abs 9.2 (*)    All other components within normal limits  COMPREHENSIVE METABOLIC PANEL - Abnormal; Notable for the following components:   Glucose, Bld 112 (*)    Calcium 8.3 (*)    Total Protein 5.7 (*)    Albumin 3.2 (*)    Alkaline Phosphatase 33 (*)    All other components within normal limits  HCG, SERUM, QUALITATIVE - Abnormal; Notable for the following components:   Preg, Serum POSITIVE (*)    All other components within normal limits  I-STAT CHEM 8, ED - Abnormal; Notable for the following components:   Glucose, Bld 108 (*)    Hemoglobin 11.9 (*)    HCT 35.0 (*)    All other components within normal limits  LIPASE, BLOOD  URINALYSIS, ROUTINE W REFLEX MICROSCOPIC  TYPE AND SCREEN    EKG None  Radiology No results found.  Procedures Procedures    Medications Ordered in  ED Medications  misoprostol (CYTOTEC) tablet 800 mcg (has no administration in time range)  sodium chloride 0.9 % bolus 1,000 mL (0 mLs Intravenous Stopped 02/23/21 1212)  fentaNYL (SUBLIMAZE) injection 50 mcg (50 mcg Intravenous Given 02/23/21 1042)  sodium chloride 0.9 % bolus 1,000 mL (1,000 mLs Intravenous New Bag/Given 02/23/21 1137)    ED Course/ Medical Decision Making/ A&P                           Medical Decision Making Amount and/or Complexity of Data Reviewed Labs: ordered.  Risk Prescription  drug management.   This patient presents to the ED for concern of vaginal bleeding, this involves a number of treatment options, and is a complaint that carries with it a high risk of complications and morbidity.  The differential diagnosis includes hemorrhage, incomplete abortion   Co morbidities: Discussed in HPI   Brief History:  G9A3 was [redacted] weeks pregnant after elective abortion.  Patient took much x-ray approximately 3 days ago, had a an episode of vasovagal today with excessive bright red blood.  Arrived via EMS with pressures that were soft around the 80s systolic, given fluid gestation by EMS along with's.  EMR reviewed including pt PMHx, past surgical history and past visits to ER.   See HPI for more details   Lab Tests:  I ordered and independently interpreted labs.  The pertinent results include:    Labs notable for CBC with a leukocytosis of 12, hemoglobin is 10.3, this is stable compared to her previous levels.  CMP without any electrolyte derangement, current levels within normal limits.  LFTs are unremarkable.  Lipase levels normal.  Not have any prior history of transfusions or anemia.   Imaging Studies:  No imaging studies ordered for this patient   Medicines ordered:  I ordered medication including   for bolus to help with hypotension, fentanyl to help with abdominal pain which improved her symptoms. Reevaluation of the patient after these medicines  showed that the patient improved I have reviewed the patients home medicines and have made adjustments as needed  Consults:  I requested consultation with Dr. Despina Hidden ,  and discussed lab findings as well as pertinent plan - they recommend: Cytotec 800 mcg orally to help with bleeding along with pain.  Vasovagal episode likely occurring due to cervical dilation, patient would benefit from outpatient Cytotec 400 mg 3 times daily for the next 3 days along with Lortab for pain control.   Reevaluation:  After the interventions noted above I re-evaluated patient and found that they have :improved   Social Determinants of Health:  The patient's social determinants of health were a factor in the care of this patient    Problem List / ED Course:  Patient here with excessive bleeding and vasovagal episode after elective abortion with methotrexate 3 days ago.   Dispostion:  After consideration of the diagnostic results and the patients response to treatment, I feel that the patent would benefit from outpatient treatment of her postpartum bleeding with Cytotec as recommended per GYN.  She is feeling much better after resuscitation with 2 L bolus.  We discussed taking it easy for the next couple days, patient stable for discharge.   Portions of this note were generated with Scientist, clinical (histocompatibility and immunogenetics). Dictation errors may occur despite best attempts at proofreading.   Final Clinical Impression(s) / ED Diagnoses Final diagnoses:  Vaginal bleeding    Rx / DC Orders ED Discharge Orders          Ordered    misoprostol (CYTOTEC) 200 MCG tablet  3 times daily        02/23/21 1224    oxyCODONE-acetaminophen (PERCOCET/ROXICET) 5-325 MG tablet  Every 6 hours PRN        02/23/21 1224              Claude Manges, PA-C 02/23/21 1421    Claude Manges, PA-C 02/23/21 1422    Gwyneth Sprout, MD 02/26/21 [phone]

## 2021-03-03 ENCOUNTER — Other Ambulatory Visit: Payer: Self-pay

## 2021-03-03 ENCOUNTER — Ambulatory Visit (INDEPENDENT_AMBULATORY_CARE_PROVIDER_SITE_OTHER): Payer: Medicaid Other | Admitting: Family Medicine

## 2021-03-03 ENCOUNTER — Encounter (HOSPITAL_BASED_OUTPATIENT_CLINIC_OR_DEPARTMENT_OTHER): Payer: Self-pay | Admitting: Family Medicine

## 2021-03-03 DIAGNOSIS — M25511 Pain in right shoulder: Secondary | ICD-10-CM | POA: Insufficient documentation

## 2021-03-03 DIAGNOSIS — M542 Cervicalgia: Secondary | ICD-10-CM | POA: Diagnosis not present

## 2021-03-03 DIAGNOSIS — M25512 Pain in left shoulder: Secondary | ICD-10-CM | POA: Diagnosis not present

## 2021-03-03 NOTE — Assessment & Plan Note (Signed)
At last office visit, patient was primarily having left sided shoulder pain.  She did start with physical therapy, she reports that she has had several sessions since that time.  She indicates that they did focus on both shoulders during PT sessions.  She indicates that recently she was doing fairly well over the past couple weeks, however today had notable increase in right shoulder pain.  Due to this, she was wondering about advanced imaging to evaluate further. ?After our last visit, she did have evaluation at urgent care due to increased pain over right shoulder.  Was given injection of Toradol at the urgent care, prescribed short course of prednisone. ?Previously, she had x-rays completed of left shoulder which were unremarkable. ?Discussed options with patient, will proceed with referral to orthopedic surgeon.  At last urgent care visit, she was provided information for orthopedic surgeon, however never established with them. ?We will refer for further evaluation, recommendations.  Discussed that she may also need to complete x-rays of right shoulder prior to consideration of advanced imaging, will defer to orthopedic surgeon for evaluation of bilateral shoulder pain and associated neck pain ?

## 2021-03-03 NOTE — Progress Notes (Signed)
? ?  Virtual Visit via Telephone ?  ?I connected with  Pamela Schaefer  on 03/03/21 by telephone/telehealth and verified that I am speaking with the correct person using two identifiers. ?  ?I discussed the limitations, risks, security and privacy concerns of performing an evaluation and management service by telephone, including the higher likelihood of inaccurate diagnosis and treatment, and the availability of in person appointments.  We also discussed the likely need of an additional face to face encounter for complete and high quality delivery of care.  I also discussed with the patient that there may be a patient responsible charge related to this service. The patient expressed understanding and wishes to proceed. ? ?Provider location is in medical facility. ?Patient location is at their home, different from provider location. ?People involved in care of the patient during this telehealth encounter were myself, my nurse/medical assistant, and my front office/scheduling team member. ? ?Review of Systems: No fevers, chills, night sweats, weight loss, chest pain, or shortness of breath.  ? ?Objective Findings:   ? ?General: Speaking full sentences, no audible heavy breathing.  Sounds alert and appropriately interactive.   ? ?Independent interpretation of tests performed by another provider:  ? ?None. ? ?Brief History, Exam, Impression, and Recommendations:   ? ?Bilateral shoulder pain ?At last office visit, patient was primarily having left sided shoulder pain.  She did start with physical therapy, she reports that she has had several sessions since that time.  She indicates that they did focus on both shoulders during PT sessions.  She indicates that recently she was doing fairly well over the past couple weeks, however today had notable increase in right shoulder pain.  Due to this, she was wondering about advanced imaging to evaluate further. ?After our last visit, she did have evaluation at urgent care due  to increased pain over right shoulder.  Was given injection of Toradol at the urgent care, prescribed short course of prednisone. ?Previously, she had x-rays completed of left shoulder which were unremarkable. ?Discussed options with patient, will proceed with referral to orthopedic surgeon.  At last urgent care visit, she was provided information for orthopedic surgeon, however never established with them. ?We will refer for further evaluation, recommendations.  Discussed that she may also need to complete x-rays of right shoulder prior to consideration of advanced imaging, will defer to orthopedic surgeon for evaluation of bilateral shoulder pain and associated neck pain ? ?I discussed the above assessment and treatment plan with the patient. The patient was provided an opportunity to ask questions and all were answered. The patient agreed with the plan and demonstrated an understanding of the instructions. ? ?The patient was advised to call back or seek an in-person evaluation if the symptoms worsen or if the condition fails to improve as anticipated. ?  ?I provided 10 minutes of face to face and non-face-to-face time during this encounter date, time was needed to gather information, review chart, records, communicate/coordinate with staff remotely, as well as complete documentation. ? ? ?___________________________________________ ?Deandre Brannan de [place], MD, ABFM, CAQSM ?Primary Care and Sports Medicine ?[facility] [place] ?

## 2021-03-04 ENCOUNTER — Other Ambulatory Visit: Payer: Self-pay

## 2021-03-04 DIAGNOSIS — Z3041 Encounter for surveillance of contraceptive pills: Secondary | ICD-10-CM

## 2021-03-04 MED ORDER — NORETHIN-ETH ESTRAD-FE BIPHAS 1 MG-10 MCG / 10 MCG PO TABS: 1.0000 | ORAL_TABLET | Freq: Every day | ORAL | 0 refills | Status: DC

## 2021-03-05 ENCOUNTER — Other Ambulatory Visit: Payer: Self-pay

## 2021-03-05 ENCOUNTER — Ambulatory Visit (INDEPENDENT_AMBULATORY_CARE_PROVIDER_SITE_OTHER): Payer: Medicaid Other | Admitting: Orthopaedic Surgery

## 2021-03-05 DIAGNOSIS — M62838 Other muscle spasm: Secondary | ICD-10-CM | POA: Diagnosis not present

## 2021-03-05 MED ORDER — METHOCARBAMOL 500 MG PO TABS: 500.0000 mg | ORAL_TABLET | Freq: Two times a day (BID) | ORAL | 0 refills | Status: AC
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
## 2021-03-09 ENCOUNTER — Other Ambulatory Visit (HOSPITAL_BASED_OUTPATIENT_CLINIC_OR_DEPARTMENT_OTHER): Payer: Self-pay | Admitting: Orthopaedic Surgery

## 2021-03-09 DIAGNOSIS — M62838 Other muscle spasm: Secondary | ICD-10-CM

## 2021-03-23 ENCOUNTER — Ambulatory Visit (HOSPITAL_BASED_OUTPATIENT_CLINIC_OR_DEPARTMENT_OTHER): Payer: Medicaid Other | Admitting: Physical Therapy

## 2021-03-29 ENCOUNTER — Telehealth (HOSPITAL_BASED_OUTPATIENT_CLINIC_OR_DEPARTMENT_OTHER): Payer: Self-pay | Admitting: Family Medicine

## 2021-03-29 NOTE — Telephone Encounter (Signed)
Per chart notes - Patient "was given injection of Toradol at the urgent care, prescribed short course of prednisone." ?Patient was advised to seek in-person eval if symptoms continued or worsened.  ?

## 2021-03-29 NOTE — Telephone Encounter (Signed)
Pt called after hours on 3/24 when the office was closed. Pt was wanting pain medicine for her shoulder. I called pt and lvm on her phone to make a virtual appt or ov to be evaluated before getting this medication. ?Please advise. ?

## 2021-03-30 NOTE — Telephone Encounter (Signed)
Pt called back on 3/28 and schedule ov for 3/30 ?

## 2021-04-01 ENCOUNTER — Ambulatory Visit (INDEPENDENT_AMBULATORY_CARE_PROVIDER_SITE_OTHER): Payer: Medicaid Other | Admitting: Family Medicine

## 2021-04-01 VITALS — BP 128/82 | HR 68 | Temp 98.7°F | Ht 63.0 in | Wt 195.7 lb

## 2021-04-01 DIAGNOSIS — M62838 Other muscle spasm: Secondary | ICD-10-CM | POA: Diagnosis not present

## 2021-04-01 NOTE — Progress Notes (Signed)
? ? ?  Procedures performed today:   ? ?None. ? ?Independent interpretation of notes and tests performed by another provider:  ? ?None. ? ?Brief History, Exam, Impression, and Recommendations:   ? ?BP 128/82   Pulse 68   Temp 98.7 ?F (37.1 ?C)   Ht 5\' 3"  (1.6 m)   Wt 195 lb 11.2 oz (88.8 kg)   SpO2 97%   BMI 34.67 kg/m?  ? ?Trapezius muscle spasm ?Patient presents for follow-up of left shoulder and upper back pain.  She did have evaluation with Dr. recently who recommended further physical therapy with modalities including dry needling.  Evaluation with him was about 4 weeks ago, she has not yet arranged for physical therapy.  She reports that this is primarily due to difficulty with getting to physical therapy at our office here.  She continues to have symptoms and is wondering about a "quick fix" for any shot that may be available. ?Discussed with patient that my recommendation is the same as Dr. Steward Drone which would be for continued physical therapy.  She would like to establish with physical therapy office that is closer to where she lives, indicates that there is a [facility] near her.  We will provide referral to [facility] with modalities as indicated by physical therapist with consideration for dry needling.  Also recommend being diligent with home exercise program as per PT ?Plan for follow-up in about 2 to 3 months, also recommend follow-up with orthopedic surgeon if symptoms are not improving ? ? ?___________________________________________ ?Pamela Schaefer de Serena Croissant, MD, ABFM, CAQSM ?Primary Care and Sports Medicine ?[facility] ?

## 2021-04-01 NOTE — Assessment & Plan Note (Signed)
Patient presents for follow-up of left shoulder and upper back pain.  She did have evaluation with Dr. Steward Drone recently who recommended further physical therapy with modalities including dry needling.  Evaluation with him was about 4 weeks ago, she has not yet arranged for physical therapy.  She reports that this is primarily due to difficulty with getting to physical therapy at our office here.  She continues to have symptoms and is wondering about a "quick fix" for any shot that may be available. ?Discussed with patient that my recommendation is the same as Dr. Serena Croissant which would be for continued physical therapy.  She would like to establish with physical therapy office that is closer to where she lives, indicates that there is a [facility] near her.  We will provide referral to [facility] with modalities as indicated by physical therapist with consideration for dry needling.  Also recommend being diligent with home exercise program as per PT ?Plan for follow-up in about 2 to 3 months, also recommend follow-up with orthopedic surgeon if symptoms are not improving ?

## 2021-04-01 NOTE — Patient Instructions (Signed)
?  Medication Instructions:  ?Your physician recommends that you continue on your current medications as directed. Please refer to the Current Medication list given to you today. ?--If you need a refill on any your medications before your next appointment, please call your pharmacy first. If no refills are authorized on file call the office.-- ? ?Referrals/Procedures/Imaging: ?[facility] ? ?Follow-Up: ?Your next appointment:   ?Your physician recommends that you schedule a follow-up appointment in: 2-3 month right shoulder with Dr. de [place] ? ?You will receive a text message or e-mail with a link to a survey about your care and experience with [place] today! We would greatly appreciate your feedback!  ? ?Thanks for letting us be apart of your health journey!!  ?Primary Care and Sports Medicine  ? ?Dr. Marcy Salvo de [place]  ? ?We encourage you to activate your patient portal called "MyChart".  Sign up information is provided on this After Visit Summary.  MyChart is used to connect with patients for Virtual Visits (Telemedicine).  Patients are able to view lab/test results, encounter notes, upcoming appointments, etc.  Non-urgent messages can be sent to your provider as well. To learn more about what you can do with MyChart, please visit --  [email or web address].   ? ?

## 2021-04-12 ENCOUNTER — Ambulatory Visit (HOSPITAL_BASED_OUTPATIENT_CLINIC_OR_DEPARTMENT_OTHER): Payer: Medicaid Other | Attending: Orthopaedic Surgery | Admitting: Physical Therapy

## 2021-04-15 ENCOUNTER — Ambulatory Visit (INDEPENDENT_AMBULATORY_CARE_PROVIDER_SITE_OTHER): Payer: Medicaid Other | Admitting: Obstetrics and Gynecology

## 2021-04-15 ENCOUNTER — Other Ambulatory Visit (HOSPITAL_COMMUNITY)
Admission: RE | Admit: 2021-04-15 | Discharge: 2021-04-15 | Disposition: A | Discharge status: Home / Self Care | Payer: Medicaid Other | Source: Ambulatory Visit | Attending: Obstetrics and Gynecology | Admitting: Obstetrics and Gynecology

## 2021-04-15 ENCOUNTER — Encounter: Payer: Self-pay | Admitting: Obstetrics and Gynecology

## 2021-04-15 VITALS — BP 114/79 | HR 79 | Ht 63.0 in | Wt 193.4 lb

## 2021-04-15 DIAGNOSIS — Z01419 Encounter for gynecological examination (general) (routine) without abnormal findings: Secondary | ICD-10-CM

## 2021-04-15 DIAGNOSIS — Z113 Encounter for screening for infections with a predominantly sexual mode of transmission: Secondary | ICD-10-CM

## 2021-04-15 DIAGNOSIS — Z3041 Encounter for surveillance of contraceptive pills: Secondary | ICD-10-CM | POA: Diagnosis not present

## 2021-04-15 DIAGNOSIS — R102 Pelvic and perineal pain: Secondary | ICD-10-CM | POA: Diagnosis not present

## 2021-04-15 MED ORDER — NORETHIN-ETH ESTRAD-FE BIPHAS 1 MG-10 MCG / 10 MCG PO TABS: 1.0000 | ORAL_TABLET | Freq: Every day | ORAL | 6 refills | Status: DC

## 2021-04-15 NOTE — Progress Notes (Signed)
Patient came in today for annual exam. Last pap: 11/27/2019 Negative ?

## 2021-04-15 NOTE — Addendum Note (Signed)
Addended by: Jearld Adjutant on: 04/15/2021 04:42 PM ? ? Modules accepted: Orders ? ?

## 2021-04-15 NOTE — Progress Notes (Signed)
? ? ?GYNECOLOGY ANNUAL PREVENTATIVE CARE ENCOUNTER NOTE ? ?History:    ? Pamela Schaefer is a 31 y.o. [phone] female here for a routine annual gynecologic exam.  Current complaints: intermittent pelvic pain.   Denies abnormal vaginal bleeding, discharge, pelvic pain, problems with intercourse or other gynecologic concerns.  ?Pt is considering switching birth control method to nexplanon. ?Pt notes occasional dyspareunia with initial penetration. ?  ?Gynecologic History ?Patient's last menstrual period was 04/04/2021. ?Contraception: OCP (estrogen/progesterone) ?Last Pap: 11/27/19. Results were: normal with negative HPV ? ? ?Obstetric History ?OB History  ?Gravida Para Term Preterm AB Living  ?7 5 5  0 2 5  ?SAB IAB Ectopic Multiple Live Births  ?1 1 0 0 5  ?  ?# Outcome Date GA Lbr Len/2nd Weight Sex Delivery Anes PTL Lv  ?7 Term 10/02/19 [redacted]w[redacted]d 02:34 / 00:06 8 lb 6.2 oz (3.805 kg) M Vag-Spont None  LIV  ?6 Term 10/17/13 [redacted]w[redacted]d 08:24 / 00:33 6 lb 4 oz (2.835 kg) F Vag-Spont EPI  LIV  ?5 Term 12/08/12 [redacted]w[redacted]d 21:00 / 00:29 7 lb 12.3 oz (3.525 kg) F Vag-Spont EPI  LIV  ?   Birth Comments: none  ?4 Term 04/25/11 [redacted]w[redacted]d 08:43 / 00:17 8 lb 8 oz (3.856 kg) F Vag-Spont EPI  LIV  ?   Birth Comments: NA  ?3 Term 09/21/07 [redacted]w[redacted]d 08:00 6 lb 6 oz (2.892 kg) M Vag-Spont EPI  LIV  ?2 IAB           ?1 SAB           ?   Birth Comments: System Generated. Please review and update pregnancy details.  ?  ?Obstetric Comments  ?Pre E w/#4  ? ? ?Past Medical History:  ?Diagnosis Date  ? Gestational diabetes 07/17/2019  ? Hypertension in pregnancy, preeclampsia, severe, delivered/postpartum 10/09/2019  ? PONV (postoperative nausea and vomiting)   ? Pregnancy induced hypertension   ? #4  ? ? ?Past Surgical History:  ?Procedure Laterality Date  ? CLAVICLE SURGERY    ? INDUCED ABORTION    ? LAPAROSCOPIC APPENDECTOMY N/A 10/25/2014  ? Procedure: APPENDECTOMY LAPAROSCOPIC;  Surgeon: 10/27/2014, MD;  Location: [facility] OR;  Service: General;  Laterality:  N/A;  ? ? ?Current Outpatient Medications on File Prior to Visit  ?Medication Sig Dispense Refill  ? Multiple Vitamin (MULTIVITAMIN) tablet Take 1 tablet by mouth daily.    ? ?No current facility-administered medications on file prior to visit.  ? ? ?Allergies  ?Allergen Reactions  ? Penicillins Itching  ? ? ?Social History:  reports that she has quit smoking. Her smoking use included cigarettes. She smoked an average of .25 packs per day. She has quit using smokeless tobacco. She reports current alcohol use. She reports that she does not use drugs. ? ?Family History  ?Problem Relation Age of Onset  ? Heart disease Maternal Grandfather   ? Kidney disease Maternal Grandfather   ?     failure  ? Healthy Mother   ? Heart disease Father   ? Diabetes Maternal Grandmother   ? Hypertension Maternal Grandmother   ? Colon cancer Neg Hx   ? Stomach cancer Neg Hx   ? Rectal cancer Neg Hx   ? Esophageal cancer Neg Hx   ? Liver cancer Neg Hx   ? ? ?The following portions of the patient's history were reviewed and updated as appropriate: allergies, current medications, past family history, past medical history, past social history, past surgical history and problem  list. ? ?Review of Systems ?Pertinent items noted in HPI and remainder of comprehensive ROS otherwise negative. ? ?Physical Exam:  ?BP 114/79 (BP Location: Right Arm, Patient Position: Sitting, Cuff Size: Large)   Pulse 79   Ht 5\' 3"  (1.6 m) Comment: reported  Wt 193 lb 6.4 oz (87.7 kg)   LMP 04/04/2021   BMI 34.26 kg/m?  ?CONSTITUTIONAL: Well-developed, well-nourished female in no acute distress.  ?HENT:  Normocephalic, atraumatic, External right and left ear normal. Oropharynx is clear and moist ?EYES: Conjunctivae and EOM are normal.  ?NECK: Normal range of motion, supple, no masses.  Normal thyroid.  ?SKIN: Skin is warm and dry. No rash noted. Not diaphoretic. No erythema. No pallor. ?MUSCULOSKELETAL: Normal range of motion. No tenderness.  No cyanosis,  clubbing, or edema.  2+ distal pulses. ?NEUROLOGIC: Alert and oriented to person, place, and time. Normal reflexes, muscle tone coordination.  ?PSYCHIATRIC: Normal mood and affect. Normal behavior. Normal judgment and thought content. ?CARDIOVASCULAR: Normal heart rate noted, regular rhythm ?RESPIRATORY: Clear to auscultation bilaterally. Effort and breath sounds normal, no problems with respiration noted. ?BREASTS: Symmetric in size. No masses, tenderness, skin changes, nipple drainage, or lymphadenopathy bilaterally. Performed in the presence of a chaperone. ?ABDOMEN: Soft, no distention noted.  Mild suprapubic and left lower quadrant tenderness, no rebound or guarding.  ?PELVIC: Normal appearing external genitalia and urethral meatus; normal appearing vaginal mucosa and cervix.  No abnormal discharge noted.  No CMT, no adnexal masses felt  Normal uterine size, no other palpable masses, no uterine or adnexal tenderness.  Performed in the presence of a chaperone. ?  ?Assessment and Plan:  ?  1. Encounter for annual routine gynecological examination ?Overall normal annual exam ? ?2. Pelvic pain in female ?Do not believe patient has endometriosis, will still check pelvic ultrasound to rule out ovarian cyst or other etiology ?- 06/04/2021 PELVIC COMPLETE WITH TRANSVAGINAL; Future ? ?3. Routine screening for STI (sexually transmitted infection) ?Per pt request ? ?4. Encounter for surveillance of contraceptive pills ? ? ?5. Encounter for birth control pills maintenance ? ?- Norethindrone-Ethinyl Estradiol-Fe Biphas (LO LOESTRIN FE) 1 MG-10 MCG / 10 MCG tablet; Take 1 tablet by mouth daily.  Dispense: 30 tablet; Refill: 6 ? ?Routine preventative health maintenance measures emphasized. ?Please refer to After Visit Summary for other counseling recommendations.  ?   ?F/u in 5 weeks to review ultrasound and to have possible nexplanon placement. ? ?[place], MD, FACOG ?Obstetrician Mariel Aloe, Faculty Practice ?Center for  [facility], [facility]  ?

## 2021-04-15 NOTE — Patient Instructions (Signed)
Weight loss meds, wegovy or saxenda ?

## 2021-04-16 LAB — CERVICOVAGINAL ANCILLARY ONLY
Bacterial Vaginitis (gardnerella): NEGATIVE
Candida Glabrata: NEGATIVE
Candida Vaginitis: NEGATIVE
Chlamydia: NEGATIVE
Comment: NEGATIVE
Comment: NEGATIVE
Comment: NEGATIVE
Comment: NEGATIVE
Comment: NEGATIVE
Comment: NORMAL
Neisseria Gonorrhea: NEGATIVE
Trichomonas: NEGATIVE

## 2021-04-30 ENCOUNTER — Ambulatory Visit (HOSPITAL_COMMUNITY)
Admission: RE | Admit: 2021-04-30 | Discharge: 2021-04-30 | Disposition: A | Discharge status: Home / Self Care | Payer: Medicaid Other | Source: Ambulatory Visit | Attending: Obstetrics and Gynecology | Admitting: Obstetrics and Gynecology

## 2021-04-30 DIAGNOSIS — R102 Pelvic and perineal pain: Secondary | ICD-10-CM | POA: Diagnosis present

## 2021-05-07 ENCOUNTER — Other Ambulatory Visit: Payer: Self-pay | Admitting: *Deleted

## 2021-05-07 DIAGNOSIS — K219 Gastro-esophageal reflux disease without esophagitis: Secondary | ICD-10-CM

## 2021-05-07 MED ORDER — OMEPRAZOLE 20 MG PO CPDR: 20.0000 mg | DELAYED_RELEASE_CAPSULE | Freq: Every day | ORAL | 5 refills | Status: DC

## 2021-05-07 NOTE — Progress Notes (Signed)
Pt called needing refill on Omeprazole. ?Pt is up to date on AEX. ?Refill sent today.  ?

## 2021-05-20 ENCOUNTER — Telehealth (INDEPENDENT_AMBULATORY_CARE_PROVIDER_SITE_OTHER): Payer: Medicaid Other | Admitting: Obstetrics

## 2021-05-20 ENCOUNTER — Encounter: Payer: Self-pay | Admitting: Obstetrics

## 2021-05-20 DIAGNOSIS — F419 Anxiety disorder, unspecified: Secondary | ICD-10-CM

## 2021-05-20 DIAGNOSIS — R102 Pelvic and perineal pain: Secondary | ICD-10-CM | POA: Diagnosis not present

## 2021-05-20 DIAGNOSIS — Z3041 Encounter for surveillance of contraceptive pills: Secondary | ICD-10-CM | POA: Diagnosis not present

## 2021-05-20 NOTE — Progress Notes (Signed)
Patient presents for Mychart follow up to discuss u/s results. Patient identified with two patient identifiers. Patient undecided about nexplanon for contraception at this time. Patient will continue ocp at this time.

## 2021-05-20 NOTE — Progress Notes (Signed)
GYNECOLOGY VIRTUAL VISIT ENCOUNTER NOTE  [facility]   Patient location: Home  I connected with Pamela Schaefer on 05/20/21 at 11:15 AM EDT by MyChart Video Encounter and verified that I am speaking with the correct person using two identifiers.   I discussed the limitations, risks, security and privacy concerns of performing an evaluation and management service virtually and the availability of in person appointments. I also discussed with the patient that there may be a patient responsible charge related to this service. The patient expressed understanding and agreed to proceed.   History:  Pamela Schaefer is a 31 y.o. [phone] female being evaluated today for ultrasound results.  She has a history of pelvic pain that has resolved. She denies any abnormal vaginal discharge, bleeding, pelvic pain or other concerns.       Past Medical History:  Diagnosis Date   Gestational diabetes 07/17/2019   Hypertension in pregnancy, preeclampsia, severe, delivered/postpartum 10/09/2019   PONV (postoperative nausea and vomiting)    Pregnancy induced hypertension    #4   Past Surgical History:  Procedure Laterality Date   CLAVICLE SURGERY     INDUCED ABORTION     LAPAROSCOPIC APPENDECTOMY N/A 10/25/2014   Procedure: APPENDECTOMY LAPAROSCOPIC;  Surgeon: Gaynelle AduEric Wilson, MD;  Location: [facility] OR;  Service: General;  Laterality: N/A;   The following portions of the patient's history were reviewed and updated as appropriate: allergies, current medications, past family history, past medical history, past social history, past surgical history and problem list.  Health Maintenance:  Normal pap and negative HRHPV on 11-27-2019.  Marland Kitchen.   Review of Systems:  Pertinent items noted in HPI and remainder of comprehensive ROS otherwise negative.  Physical Exam:   General:  Alert, oriented and cooperative. Patient appears to be in no acute distress.  Mental Status:  Normal mood and affect. Normal behavior. Normal judgment and thought content.   Respiratory: Normal respiratory effort, no problems with respiration noted  Rest of physical exam deferred due to type of encounter  Labs and Imaging No results found for this or any previous visit (from the past 336 hour(s)). US PELVIC COMPLETE WITH TRANSVAGINAL  Result Date: 05/02/2021 CLINICAL DATA:  Right and left adnexal pain for 6 months. EXAM: TRANSABDOMINAL AND TRANSVAGINAL ULTRASOUND OF PELVIS TECHNIQUE: Both transabdominal and transvaginal ultrasound examinations of the pelvis were performed. Transabdominal technique was performed for global imaging of the pelvis including uterus, ovaries, adnexal regions, and pelvic cul-de-sac. It was necessary to proceed with endovaginal exam following the transabdominal exam to visualize the ovaries. COMPARISON:  09/11/2019 FINDINGS: Uterus Measurements: 11.7 x 4.6 x 5.7 cm = volume: 162 mL. A fibroid is noted in the anterior body measuring 1.0 x 0.7 x 0.7 cm. Endometrium Thickness: 6 mm.  No focal abnormality visualized. Right ovary Measurements: 3.0 x 1.6 x 1.3 cm = volume: 3.4 mL. Normal appearance/no adnexal mass. Left ovary Measurements: 2.8 x 2.0 x 2.4 cm = volume: 6.9 mL. Only seen transabdominally due to high and lateral location. Normal appearance/no adnexal mass. Other findings No abnormal free fluid. IMPRESSION: 1. Uterine fibroid. 2. Otherwise normal examination of the pelvis. Electronically Signed   By: Thornell SartoriusLaura  Taylor M.D.   On: 05/02/2021 01:19       Assessment and Plan:     1. Pelvic pain in female - will follow clinically  2. Anxiety Rx: - Ambulatory referral to Integrated Behavioral Health  3. Encounter for surveillance of contraceptive  pills - continue OCP's       I discussed the assessment and treatment plan with the patient. The patient was provided an opportunity to ask questions and all were answered. The patient agreed with the plan and  demonstrated an understanding of the instructions.   The patient was advised to call back or seek an in-person evaluation/go to the ED if the symptoms worsen or if the condition fails to improve as anticipated.  I have spent a total of 15 minutes of non-face-to-face time, excluding clinical staff time, reviewing notes and preparing to see patient, ordering tests and/or medications, and counseling the patient.    Baltazar Najjar, MD Center for [facility], [place], [facility] 05/20/21

## 2021-05-21 ENCOUNTER — Ambulatory Visit (INDEPENDENT_AMBULATORY_CARE_PROVIDER_SITE_OTHER): Payer: Medicaid Other | Admitting: Licensed Clinical Social Worker

## 2021-05-21 DIAGNOSIS — F419 Anxiety disorder, unspecified: Secondary | ICD-10-CM | POA: Diagnosis not present

## 2021-05-21 NOTE — BH Specialist Note (Signed)
Integrated Behavioral Health via Telemedicine Visit  05/21/2021 Pamela Schaefer [id number]  Number of Integrated Behavioral Health Clinician visits: 1 Session Start time: 10:00am  Session End time: 10:23am Total time in minutes: 23 mins via phone   Referring Provider: Dr. Clearance Coots Patient/Family location: Home  [facility] Provider location: [facility]  All persons participating in visit: Pt S Milson and LCSW A. Amira Podolak  Types of Service: Individual psychotherapy and Telephone visit  I connected with Pamela Schaefer and/or Pamela Schaefer's n/a via  Telephone or Video Enabled Telemedicine Application  (Video is Caregility application) and verified that I am speaking with the correct person using two identifiers. Discussed confidentiality: Yes   I discussed the limitations of telemedicine and the availability of in person appointments.  Discussed there is a possibility of technology failure and discussed alternative modes of communication if that failure occurs.  I discussed that engaging in this telemedicine visit, they consent to the provision of behavioral healthcare and the services will be billed under their insurance.  Patient and/or legal guardian expressed understanding and consented to Telemedicine visit: Yes   Presenting Concerns: Patient and/or family reports the following symptoms/concerns: anxiety Duration of problem: 2021; Severity of problem: mild  Patient and/or Family's Strengths/Protective Factors: Concrete supports in place (healthy food, safe environments, etc.)  Goals Addressed: Patient will:  Reduce symptoms of: anxiety   Increase knowledge and/or ability of: coping skills   Demonstrate ability to: Increase healthy adjustment to current life circumstances  Progress towards Goals: Ongoing  Interventions: Interventions utilized:  Supportive Counseling Standardized Assessments completed: PHQ 9  Patient and/or Family Response: Ms. Carapia responded well to visit    Assessment: Patient currently experiencing general anxiety.   Patient may benefit from community mental health.  Plan: Follow up with behavioral health clinician on : as needed  Behavioral recommendations: deep breathing excerises, grounding technique and follow up with outpatient behavioral health Referral(s): Integrated [employer] (In Clinic)  I discussed the assessment and treatment plan with the patient and/or parent/guardian. They were provided an opportunity to ask questions and all were answered. They agreed with the plan and demonstrated an understanding of the instructions.   They were advised to call back or seek an in-person evaluation if the symptoms worsen or if the condition fails to improve as anticipated.  Gwyndolyn Saxon, LCSW

## 2021-06-01 ENCOUNTER — Ambulatory Visit (INDEPENDENT_AMBULATORY_CARE_PROVIDER_SITE_OTHER): Payer: Medicaid Other | Admitting: Family Medicine

## 2021-06-01 ENCOUNTER — Encounter (HOSPITAL_BASED_OUTPATIENT_CLINIC_OR_DEPARTMENT_OTHER): Payer: Self-pay | Admitting: Family Medicine

## 2021-06-01 DIAGNOSIS — E669 Obesity, unspecified: Secondary | ICD-10-CM

## 2021-06-01 DIAGNOSIS — F419 Anxiety disorder, unspecified: Secondary | ICD-10-CM

## 2021-06-01 DIAGNOSIS — E66811 Obesity, class 1: Secondary | ICD-10-CM

## 2021-06-01 DIAGNOSIS — M62838 Other muscle spasm: Secondary | ICD-10-CM

## 2021-06-01 MED ORDER — SERTRALINE HCL 25 MG PO TABS: 25.0000 mg | ORAL_TABLET | Freq: Every day | ORAL | 1 refills | Status: DC

## 2021-06-01 NOTE — Assessment & Plan Note (Signed)
Did have an episode of increased pain during which she utilized NSAID, muscle relaxer.  Symptoms eventually did subside and at present she is doing fairly well.  At last visit, we did discuss potential role of physical therapy which was also recommended by orthopedic surgeon previously.  Unfortunately, she has been not had any further evaluation with this.  She was contacted by physical therapy clinic, but did not schedule appointment.  Indicates that she does not think that working with the PT office will be beneficial, indicates that she has worked with them before and did not have significant improvement.  She would have some interest in working with an alternative physical therapist such as at our building, however the travel would make this difficult for her.  She has some questions about possible MRI. Discussed with patient that most likely proceeding with adequate conservative management would be primary recommendation and would lead to some improvement.  Again recommend working with physical therapy.  She indicates that she may be able to commit to this once her kids are out of school.  Also discussed that generally to obtain an MRI, adequate conservative management will need to be trialed including physical therapy.  Generally symptoms have been fluctuating, no specific persistent worsening

## 2021-06-01 NOTE — Assessment & Plan Note (Signed)
Reports that in meeting with OB/GYN specialist recently, weight management medications were brought up, however were not specifically discussed.  She reports that provider kept mentioning an oral medication but does not recall or was not told of any specific oral medication to consider.  Today we discussed that there are both oral medication options as well as injectable.  As above, she indicates that she has not typically a medication person.  Also discussed that for some medications, particularly the injectables, insurance coverage can be variable and cost can be high Also discussed option of referral to healthy weight and wellness clinic she does have some interest in this, referral placed today We will continue to monitor progress

## 2021-06-01 NOTE — Progress Notes (Signed)
Virtual Visit via Telephone   I connected with  Pamela Schaefer  on 06/01/21 by telephone/telehealth and verified that I am speaking with the correct person using two identifiers.   I discussed the limitations, risks, security and privacy concerns of performing an evaluation and management service by telephone, including the higher likelihood of inaccurate diagnosis and treatment, and the availability of in person appointments.  We also discussed the likely need of an additional face to face encounter for complete and high quality delivery of care.  I also discussed with the patient that there may be a patient responsible charge related to this service. The patient expressed understanding and wishes to proceed.  Provider location is in medical facility. Patient location is at their home, different from provider location. People involved in care of the patient during this telehealth encounter were myself, my nurse/medical assistant, and my front office/scheduling team member.  Review of Systems: No fevers, chills, night sweats, weight loss, chest pain, or shortness of breath.   Objective Findings:    General: Speaking full sentences, no audible heavy breathing.  Sounds alert and appropriately interactive.    Independent interpretation of tests performed by another provider:   None.  Brief History, Exam, Impression, and Recommendations:    Trapezius muscle spasm Did have an episode of increased pain during which she utilized NSAID, muscle relaxer.  Symptoms eventually did subside and at present she is doing fairly well.  At last visit, we did discuss potential role of physical therapy which was also recommended by orthopedic surgeon previously.  Unfortunately, she has been not had any further evaluation with this.  She was contacted by physical therapy clinic, but did not schedule appointment.  Indicates that she does not think that working with the PT office will be beneficial, indicates  that she has worked with them before and did not have significant improvement.  She would have some interest in working with an alternative physical therapist such as at our building, however the travel would make this difficult for her.  She has some questions about possible MRI. Discussed with patient that most likely proceeding with adequate conservative management would be primary recommendation and would lead to some improvement.  Again recommend working with physical therapy.  She indicates that she may be able to commit to this once her kids are out of school.  Also discussed that generally to obtain an MRI, adequate conservative management will need to be trialed including physical therapy.  Generally symptoms have been fluctuating, no specific persistent worsening  Anxiety Has been working with counselor recently.  Feels that due to life stressors, symptoms are somewhat worsened.  In the past, she was taking sertraline at 1 point, primarily related to depressive symptoms.  She indicates that she is not typically a "medication person" and thus will forget to take medications regularly.  Despite this, she does have some interest in resuming low-dose of SSRI to better control symptoms. Again reviewed medication management, had been on SSRI previously for treatment of depressive symptoms, however discussed that the same class of medications would be first-line option for controlling symptoms of anxiety After consideration, she decided that she would like to proceed with SSRI, will send prescription for sertraline to pharmacy on file, beginning at low-dose and following up in 2 weeks to monitor progress If tolerating well, can consider titrating to 50 mg daily dose  Obesity (BMI 30.0-34.9) Reports that in meeting with OB/GYN specialist recently, weight management medications were brought up,  however were not specifically discussed.  She reports that provider kept mentioning an oral medication but does  not recall or was not told of any specific oral medication to consider.  Today we discussed that there are both oral medication options as well as injectable.  As above, she indicates that she has not typically a medication person.  Also discussed that for some medications, particularly the injectables, insurance coverage can be variable and cost can be high Also discussed option of referral to healthy weight and wellness clinic she does have some interest in this, referral placed today We will continue to monitor progress  I discussed the above assessment and treatment plan with the patient. The patient was provided an opportunity to ask questions and all were answered. The patient agreed with the plan and demonstrated an understanding of the instructions.  The patient was advised to call back or seek an in-person evaluation if the symptoms worsen or if the condition fails to improve as anticipated.  I provided 15 minutes of face to face and non-face-to-face time during this encounter date, time was needed to gather information, review chart, records, communicate/coordinate with staff remotely, as well as complete documentation.   ___________________________________________ Mansour Balboa de [place], MD, ABFM, CAQSM Primary Care and Sports Medicine [facility]

## 2021-06-01 NOTE — Assessment & Plan Note (Signed)
Has been working with counselor recently.  Feels that due to life stressors, symptoms are somewhat worsened.  In the past, she was taking sertraline at 1 point, primarily related to depressive symptoms.  She indicates that she is not typically a "medication person" and thus will forget to take medications regularly.  Despite this, she does have some interest in resuming low-dose of SSRI to better control symptoms. Again reviewed medication management, had been on SSRI previously for treatment of depressive symptoms, however discussed that the same class of medications would be first-line option for controlling symptoms of anxiety After consideration, she decided that she would like to proceed with SSRI, will send prescription for sertraline to pharmacy on file, beginning at low-dose and following up in 2 weeks to monitor progress If tolerating well, can consider titrating to 50 mg daily dose

## 2021-06-23 ENCOUNTER — Other Ambulatory Visit (HOSPITAL_BASED_OUTPATIENT_CLINIC_OR_DEPARTMENT_OTHER): Payer: Self-pay | Admitting: Family Medicine

## 2021-06-23 DIAGNOSIS — F419 Anxiety disorder, unspecified: Secondary | ICD-10-CM

## 2021-07-21 ENCOUNTER — Encounter: Payer: Self-pay | Admitting: Obstetrics

## 2021-09-09 IMAGING — CR DG CHEST 2V
2 series · 2 of 2 positions shown · non-contrast
Comparison: Prior radiograph from 02/25/2018.

CLINICAL DATA: Initial evaluation for postpartum preeclampsia.

EXAM:
CHEST - 2 VIEW

[chest pa]
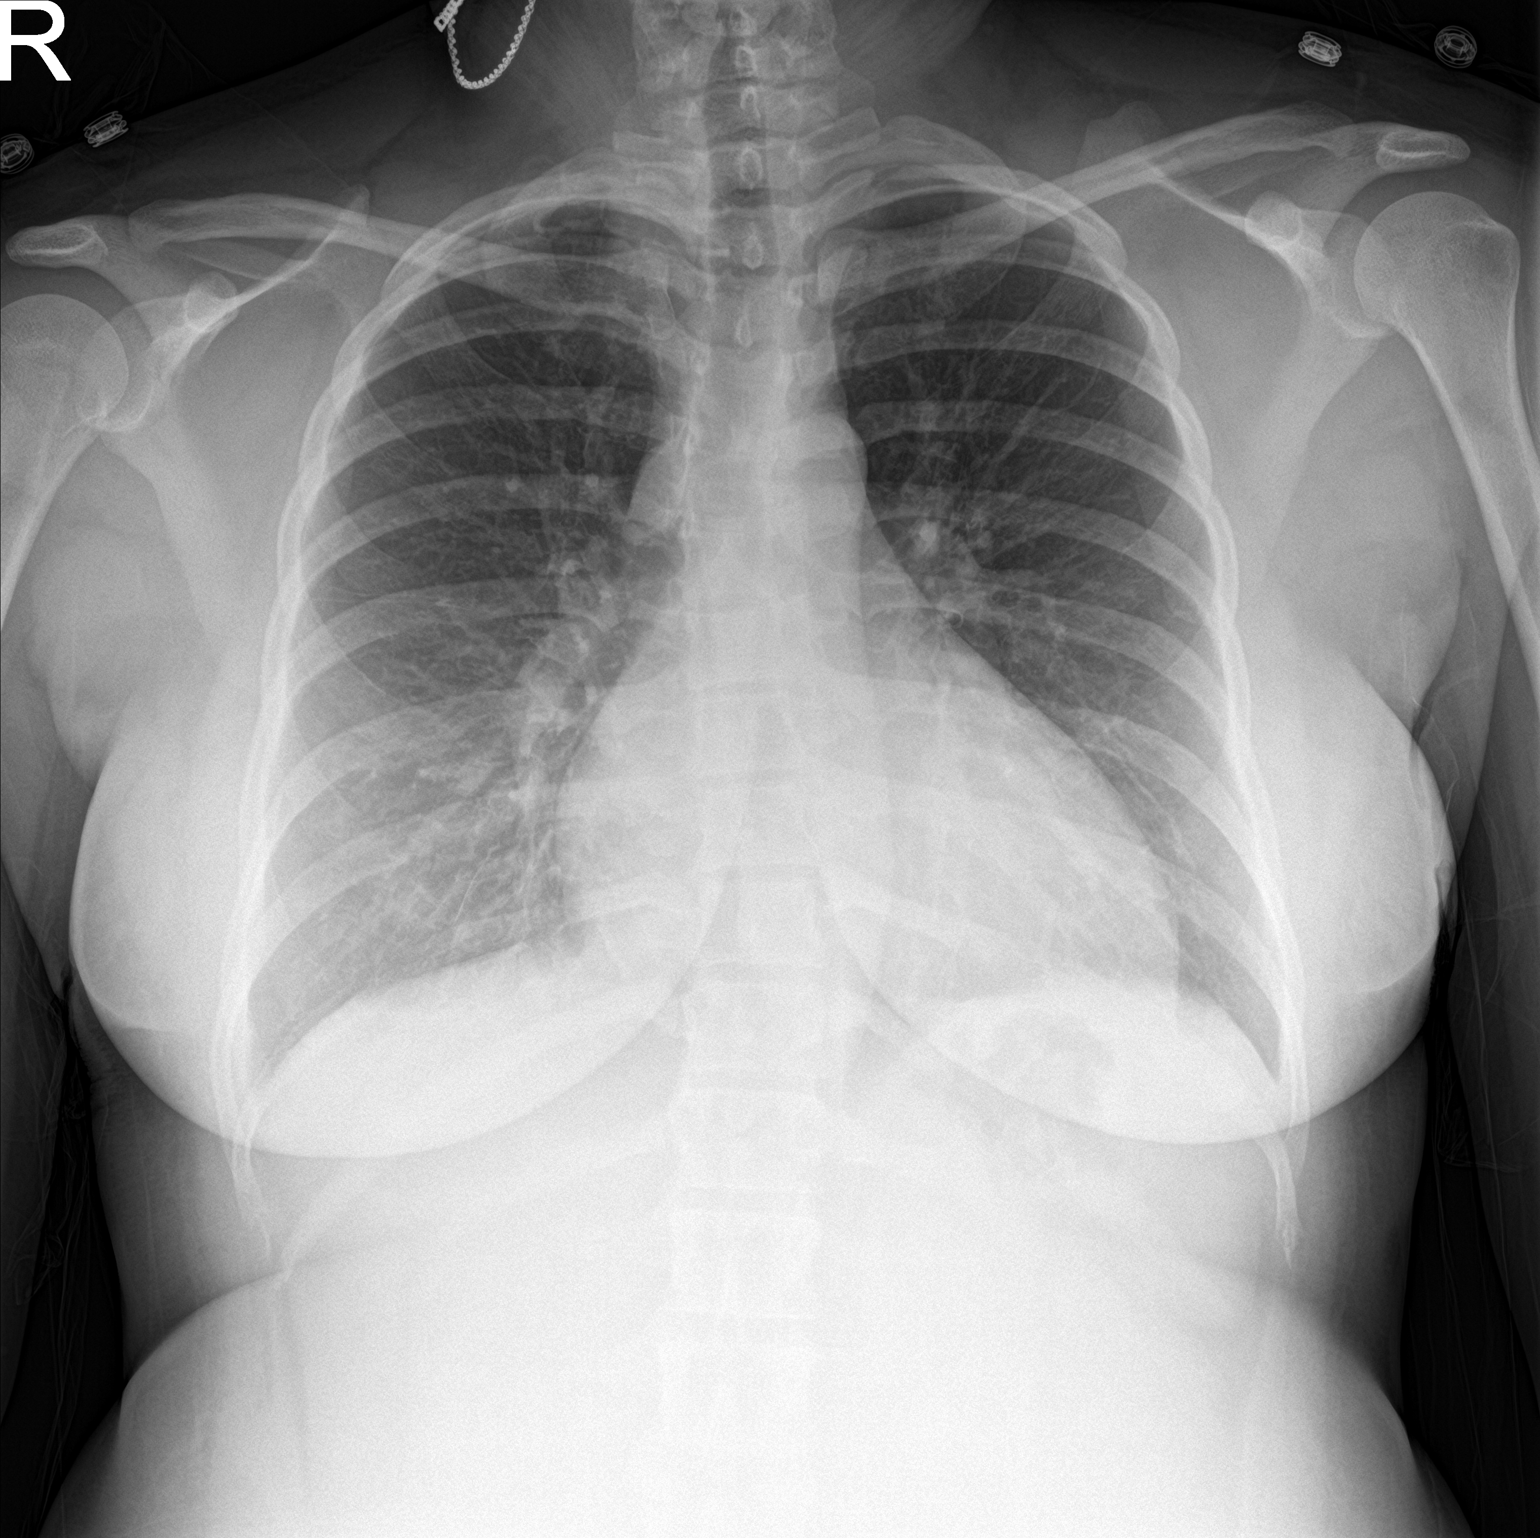

[chest lat]
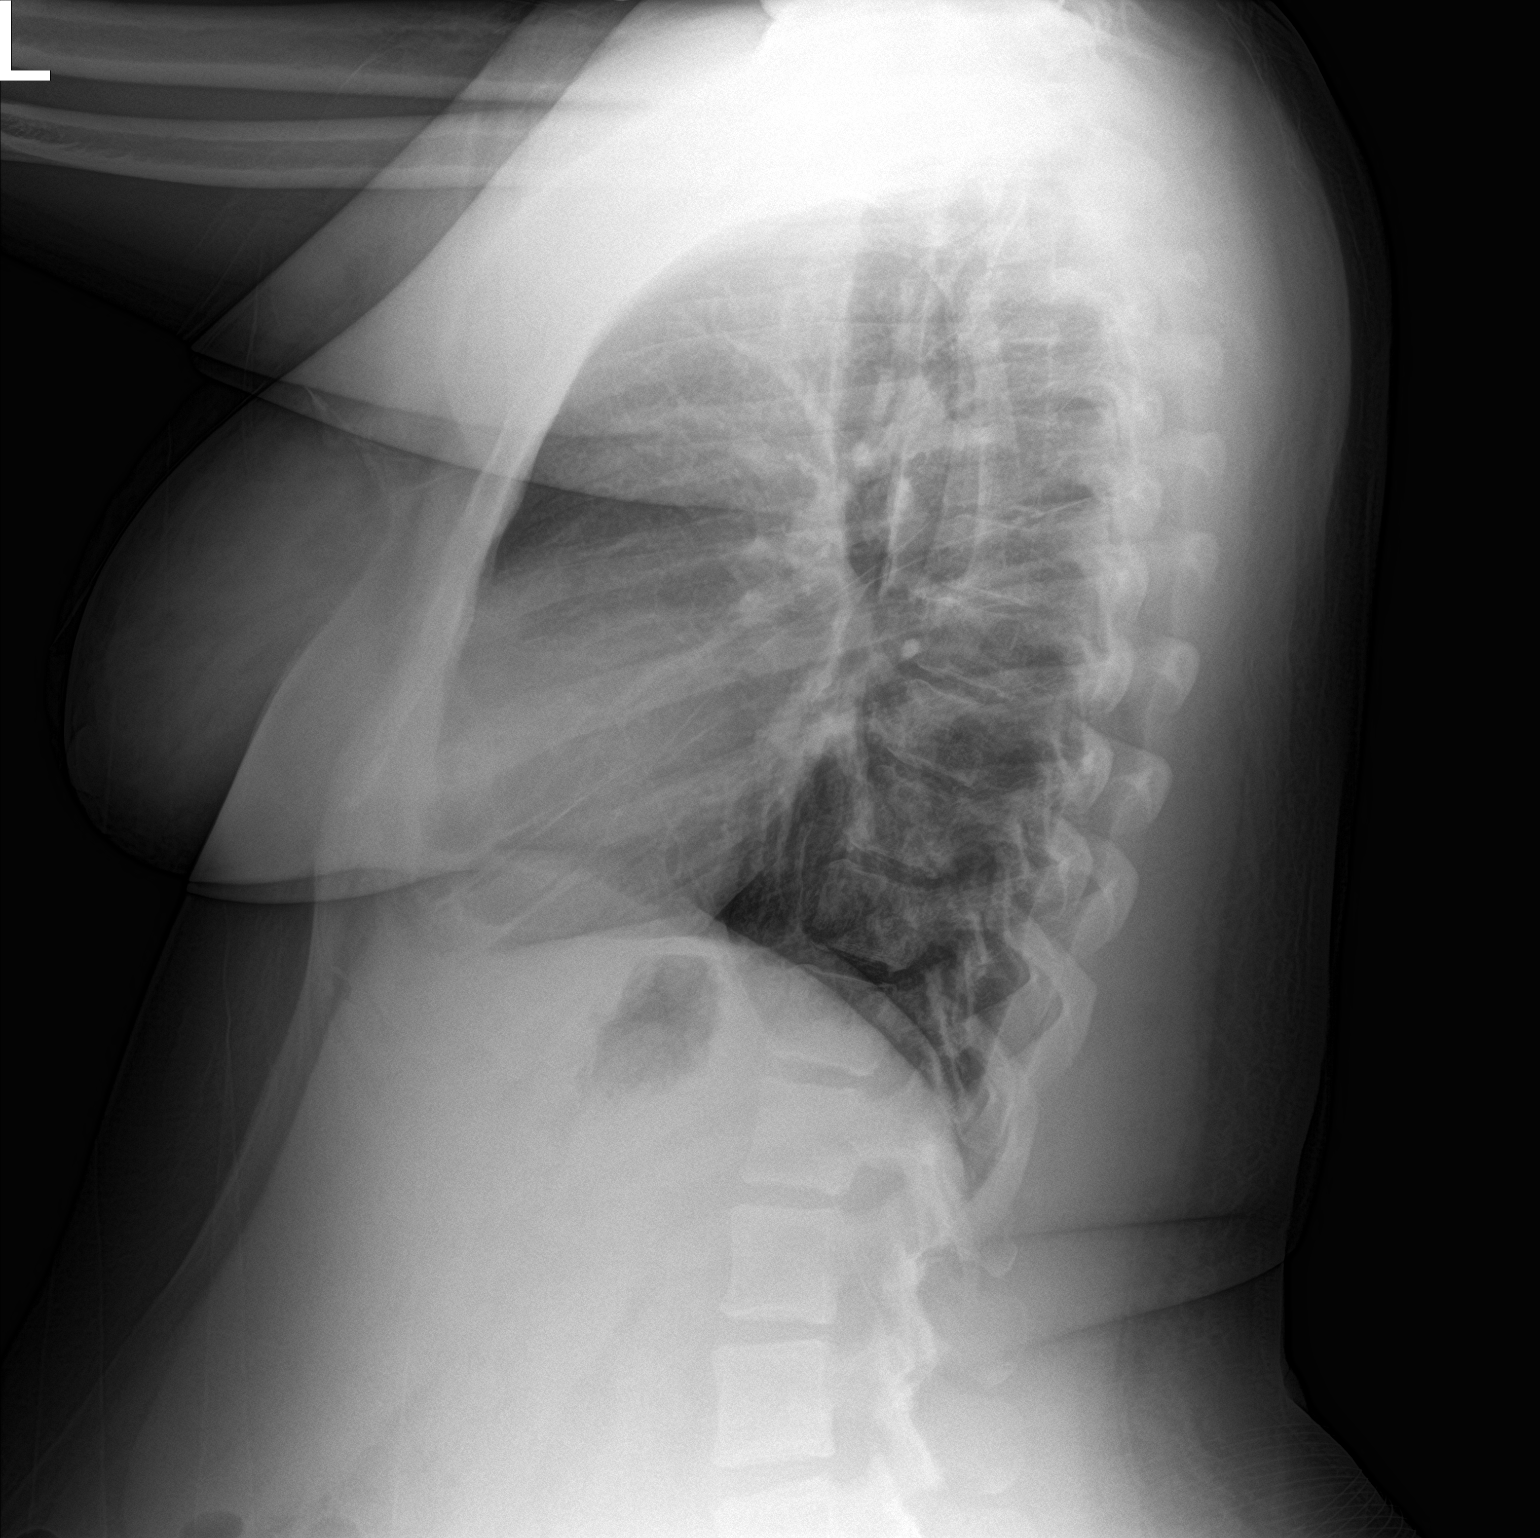

[2 of 2 positions shown; findings below may reference images not displayed]

FINDINGS: Heart size appears mildly enlarged.  Mediastinal silhouette normal.

Lungs well inflated. Minimal left basilar subsegmental atelectasis.
No focal infiltrates. No pulmonary edema or pleural effusion. No
pneumothorax.

No acute osseous abnormality.  Mild sigmoid scoliosis noted.
IMPRESSION: 1. Mild cardiomegaly without pulmonary edema.
2. Minimal left basilar subsegmental atelectasis.
3. No other active cardiopulmonary disease.

## 2021-11-01 ENCOUNTER — Encounter (INDEPENDENT_AMBULATORY_CARE_PROVIDER_SITE_OTHER): Payer: Medicaid Other | Admitting: Internal Medicine

## 2022-03-01 ENCOUNTER — Other Ambulatory Visit: Payer: Self-pay

## 2022-03-01 ENCOUNTER — Emergency Department (HOSPITAL_COMMUNITY)
Admission: EM | Admit: 2022-03-01 | Discharge: 2022-03-01 | Discharge status: Against Medical Advice | Payer: Medicaid Other | Attending: Emergency Medicine | Admitting: Emergency Medicine

## 2022-03-01 DIAGNOSIS — Z5321 Procedure and treatment not carried out due to patient leaving prior to being seen by health care provider: Secondary | ICD-10-CM | POA: Insufficient documentation

## 2022-03-01 DIAGNOSIS — M7989 Other specified soft tissue disorders: Secondary | ICD-10-CM | POA: Insufficient documentation

## 2022-03-01 NOTE — ED Triage Notes (Signed)
Pt presents unable to get ring off of R ring finger d/t swelling. Woke up with swollen finger this morning.

## 2022-03-01 NOTE — ED Notes (Signed)
NA x1

## 2022-03-01 NOTE — ED Notes (Signed)
Ring cut off.

## 2022-05-17 ENCOUNTER — Encounter (HOSPITAL_COMMUNITY): Payer: Self-pay

## 2022-05-17 ENCOUNTER — Emergency Department (HOSPITAL_COMMUNITY): Payer: Medicaid Other

## 2022-05-17 ENCOUNTER — Emergency Department (HOSPITAL_COMMUNITY)
Admission: EM | Admit: 2022-05-17 | Discharge: 2022-05-17 | Disposition: A | Discharge status: Home / Self Care | Payer: Medicaid Other | Attending: Emergency Medicine | Admitting: Emergency Medicine

## 2022-05-17 DIAGNOSIS — R0789 Other chest pain: Secondary | ICD-10-CM | POA: Insufficient documentation

## 2022-05-17 DIAGNOSIS — R109 Unspecified abdominal pain: Secondary | ICD-10-CM | POA: Diagnosis present

## 2022-05-17 LAB — URINALYSIS, ROUTINE W REFLEX MICROSCOPIC
Bilirubin Urine: NEGATIVE
Glucose, UA: NEGATIVE mg/dL
Ketones, ur: NEGATIVE mg/dL
Leukocytes,Ua: NEGATIVE
Nitrite: NEGATIVE
Protein, ur: NEGATIVE mg/dL
Specific Gravity, Urine: 1.012 (ref 1.005–1.030)
pH: 7 (ref 5.0–8.0)

## 2022-05-17 LAB — COMPREHENSIVE METABOLIC PANEL
ALT: 13 U/L (ref 0–44)
AST: 14 U/L — ABNORMAL LOW (ref 15–41)
Albumin: 4.3 g/dL (ref 3.5–5.0)
Alkaline Phosphatase: 42 U/L (ref 38–126)
Anion gap: 7 (ref 5–15)
BUN: 11 mg/dL (ref 6–20)
CO2: 25 mmol/L (ref 22–32)
Calcium: 9 mg/dL (ref 8.9–10.3)
Chloride: 105 mmol/L (ref 98–111)
Creatinine, Ser: 0.59 mg/dL (ref 0.44–1.00)
GFR, Estimated: >60 mL/min (ref >60)
Glucose, Bld: 94 mg/dL (ref 70–99)
Potassium: 3.9 mmol/L (ref 3.5–5.1)
Sodium: 137 mmol/L (ref 135–145)
Total Bilirubin: 0.8 mg/dL (ref 0.3–1.2)
Total Protein: 7.6 g/dL (ref 6.5–8.1)

## 2022-05-17 LAB — CBC WITH DIFFERENTIAL/PLATELET
Abs Immature Granulocytes: 0.05 10*3/uL (ref 0.00–0.07)
Basophils Absolute: 0 10*3/uL (ref 0.0–0.1)
Basophils Relative: 1 %
Eosinophils Absolute: 0.1 10*3/uL (ref 0.0–0.5)
Eosinophils Relative: 1 %
HCT: 40.5 % (ref 36.0–46.0)
Hemoglobin: 13.4 g/dL (ref 12.0–15.0)
Immature Granulocytes: 1 %
Lymphocytes Relative: 36 %
Lymphs Abs: 1.8 10*3/uL (ref 0.7–4.0)
MCH: 29.8 pg (ref 26.0–34.0)
MCHC: 33.1 g/dL (ref 30.0–36.0)
MCV: 90.2 fL (ref 80.0–100.0)
Monocytes Absolute: 0.2 10*3/uL (ref 0.1–1.0)
Monocytes Relative: 4 %
Neutro Abs: 2.9 10*3/uL (ref 1.7–7.7)
Neutrophils Relative %: 57 %
Platelets: 246 10*3/uL (ref 150–400)
RBC: 4.49 MIL/uL (ref 3.87–5.11)
RDW: 14.6 % (ref 11.5–15.5)
WBC: 5 10*3/uL (ref 4.0–10.5)
nRBC: 0 % (ref 0.0–0.2)

## 2022-05-17 LAB — TROPONIN I (HIGH SENSITIVITY): Troponin I (High Sensitivity): <2 ng/L (ref <18)

## 2022-05-17 LAB — I-STAT BETA HCG BLOOD, ED (MC, WL, AP ONLY): I-stat hCG, quantitative: <5 m[IU]/mL (ref <5)

## 2022-05-17 LAB — LIPASE, BLOOD: Lipase: 26 U/L (ref 11–51)

## 2022-05-17 MED ORDER — LACTATED RINGERS IV BOLUS
1000.0000 mL | Freq: Once | INTRAVENOUS | Status: AC
  Administered 2022-05-17: 1000 mL via INTRAVENOUS

## 2022-05-17 MED ORDER — IOHEXOL 300 MG/ML  SOLN
100.0000 mL | Freq: Once | INTRAMUSCULAR | Status: AC | PRN
  Administered 2022-05-17: 100 mL via INTRAVENOUS

## 2022-05-17 MED ORDER — METHOCARBAMOL 500 MG PO TABS: 500.0000 mg | ORAL_TABLET | Freq: Two times a day (BID) | ORAL | 0 refills | Status: DC

## 2022-05-17 MED ORDER — PANTOPRAZOLE SODIUM 40 MG PO TBEC
40.0000 mg | DELAYED_RELEASE_TABLET | Freq: Once | ORAL | Status: AC
  Administered 2022-05-17: 40 mg via ORAL
  Filled 2022-05-17: qty 1

## 2022-05-17 MED ORDER — ALUM & MAG HYDROXIDE-SIMETH 200-200-20 MG/5ML PO SUSP
30.0000 mL | Freq: Once | ORAL | Status: AC
  Administered 2022-05-17: 30 mL via ORAL
  Filled 2022-05-17: qty 30

## 2022-05-17 MED ORDER — PANTOPRAZOLE SODIUM 40 MG PO TBEC: 40.0000 mg | DELAYED_RELEASE_TABLET | Freq: Every day | ORAL | 1 refills | Status: DC

## 2022-05-17 NOTE — ED Provider Notes (Signed)
[facility] AT [facility] Provider Note   CSN: [id number] Arrival date & time: 05/17/22  4098     History  Chief Complaint  Patient presents with   Abdominal Pain    Pamela Schaefer is a 32 y.o. female.  32 year old female presents today for evaluation of abdominal pain, chest pain.  Abdominal pain has been ongoing for the past couple weeks.  Chest pain started the past several days.  Chest pain is described as sharp, and fleeting.  Not associated with lightheadedness, diaphoresis, palpitations, shortness of breath.  Abdominal pain is without associated nausea, dysuria, flank pain, or fever.  Does have history of GERD, however has not been on medication for the past week as she has run out.  The history is provided by the patient. No language interpreter was used.       Home Medications Prior to Admission medications   Medication Sig Start Date End Date Taking? Authorizing Provider  Multiple Vitamin (MULTIVITAMIN) tablet Take 1 tablet by mouth daily.    [provider]  Norethindrone-Ethinyl Estradiol-Fe Biphas (LO LOESTRIN FE) 1 MG-10 MCG / 10 MCG tablet Take 1 tablet by mouth daily. 04/15/21   Warden Fillers, MD  omeprazole (PRILOSEC) 20 MG capsule Take 1 capsule (20 mg total) by mouth daily. 05/07/21   Brock Bad, MD  sertraline (ZOLOFT) 25 MG tablet TAKE 1 TABLET (25 MG TOTAL) BY MOUTH DAILY. 06/23/21   de [place], Raymond J, MD      Allergies    Penicillins    Review of Systems   Review of Systems  Constitutional:  Negative for chills and fever.  Respiratory:  Negative for shortness of breath.   Cardiovascular:  Positive for chest pain. Negative for palpitations and leg swelling.  Gastrointestinal:  Positive for abdominal pain. Negative for nausea and vomiting.  Genitourinary:  Negative for dysuria.  Neurological:  Negative for light-headedness.  All other systems reviewed and are negative.   Physical Exam Updated Vital  Signs BP 116/72   Pulse (!) 56   Temp 98.2 F (36.8 C) (Oral)   Resp 16   SpO2 100%  Physical Exam Vitals and nursing note reviewed.  Constitutional:      General: She is not in acute distress.    Appearance: Normal appearance. She is not ill-appearing.  HENT:     Head: Normocephalic and atraumatic.     Nose: Nose normal.  Eyes:     Conjunctiva/sclera: Conjunctivae normal.  Cardiovascular:     Rate and Rhythm: Normal rate and regular rhythm.  Pulmonary:     Effort: Pulmonary effort is normal. No respiratory distress.     Breath sounds: Normal breath sounds. No wheezing.  Abdominal:     General: There is no distension.     Palpations: Abdomen is soft.     Tenderness: There is no abdominal tenderness. There is no guarding.  Musculoskeletal:        General: No deformity. Normal range of motion.     Cervical back: Normal range of motion.  Skin:    Findings: No rash.  Neurological:     Mental Status: She is alert.     ED Results / Procedures / Treatments   Labs (all labs ordered are listed, but only abnormal results are displayed) Labs Reviewed  COMPREHENSIVE METABOLIC PANEL - Abnormal; Notable for the following components:      Result Value   AST 14 (*)    All other components  within normal limits  URINALYSIS, ROUTINE W REFLEX MICROSCOPIC - Abnormal; Notable for the following components:   Color, Urine STRAW (*)    Hgb urine dipstick LARGE (*)    Bacteria, UA RARE (*)    All other components within normal limits  CBC WITH DIFFERENTIAL/PLATELET  LIPASE, BLOOD  I-STAT BETA HCG BLOOD, ED ([facility], [facility], [facility] ONLY)  TROPONIN I (HIGH SENSITIVITY)    EKG EKG Interpretation  Date/Time:  Tuesday May 17 2022 10:04:13 EDT Ventricular Rate:  63 PR Interval:  216 QRS Duration: 75 QT Interval:  405 QTC Calculation: 415 R Axis:   47 Text Interpretation: Sinus arrhythmia Prolonged PR interval Probable left atrial enlargement Abnormal Q suggests anterior infarct no acute ST/T  changes similar to Dec 2022 Confirmed by Pricilla Loveless [phone]) on 05/17/2022 11:03:28 AM  Radiology CT ABDOMEN PELVIS W CONTRAST  Result Date: 05/17/2022 CLINICAL DATA:  Abdominal pain EXAM: CT ABDOMEN AND PELVIS WITH CONTRAST TECHNIQUE: Multidetector CT imaging of the abdomen and pelvis was performed using the standard protocol following bolus administration of intravenous contrast. RADIATION DOSE REDUCTION: This exam was performed according to the departmental dose-optimization program which includes automated exposure control, adjustment of the mA and/or kV according to patient size and/or use of iterative reconstruction technique. CONTRAST:  OMNIPAQUE IOHEXOL 300 MG/ML  SOLN COMPARISON:  10/25/2014 FINDINGS: Lower chest: The lung bases are clear. No pleural fluid or airspace consolidation. Hepatobiliary: No focal liver abnormality is seen. No gallstones, gallbladder wall thickening, or biliary dilatation. Pancreas: Unremarkable. No pancreatic ductal dilatation or surrounding inflammatory changes. Spleen: Normal in size without focal abnormality. Adrenals/Urinary Tract: Adrenal glands are unremarkable. Kidneys are normal, without renal calculi, focal lesion, or hydronephrosis. Bladder is unremarkable. Stomach/Bowel: Small hiatal hernia. Stomach is nondistended. Status post appendectomy. No evidence of bowel wall thickening, distention, or inflammatory changes. Vascular/Lymphatic: No significant vascular findings are present. No enlarged abdominal or pelvic lymph nodes. Reproductive: Uterus and bilateral adnexa are unremarkable. Other: No abdominal wall hernia or abnormality. No abdominopelvic ascites. Musculoskeletal: No acute or significant osseous findings. IMPRESSION: 1. No acute findings within the abdomen or pelvis. 2. Small hiatal hernia. 3. Status post appendectomy. Electronically Signed   By: Signa Kell M.D.   On: 05/17/2022 12:55   DG Chest 2 View  Result Date: 05/17/2022 CLINICAL  DATA:  Chest pain EXAM: CHEST - 2 VIEW COMPARISON:  10/10/2019 FINDINGS: The heart size and mediastinal contours are within normal limits. Both lungs are clear. The visualized skeletal structures are unremarkable. IMPRESSION: No active cardiopulmonary disease. Electronically Signed   By: Duanne Guess D.O.   On: 05/17/2022 11:01    Procedures Procedures    Medications Ordered in ED Medications  pantoprazole (PROTONIX) EC tablet 40 mg (40 mg Oral Given 05/17/22 1025)  alum & mag hydroxide-simeth (MAALOX/MYLANTA) 200-200-20 MG/5ML suspension 30 mL (30 mLs Oral Given 05/17/22 1025)  lactated ringers bolus 1,000 mL (1,000 mLs Intravenous New Bag/Given 05/17/22 1207)  iohexol (OMNIPAQUE) 300 MG/ML solution 100 mL (100 mLs Intravenous Contrast Given 05/17/22 1213)    ED Course/ Medical Decision Making/ A&P                             Medical Decision Making Amount and/or Complexity of Data Reviewed Labs: ordered. Radiology: ordered.  Risk OTC drugs. Prescription drug management.   Medical Decision Making / ED Course   This patient presents to the ED for concern of abdominal pain, chest pain,  this involves an extensive number of treatment options, and is a complaint that carries with it a high risk of complications and morbidity.  The differential diagnosis includes GERD, ACS, pancreatitis, UTI, pyelonephritis, gastroenteritis, colitis  MDM: 32 year old female presents with above-mentioned complaints.  Overall she is well-appearing and stable.  Tolerating p.o. intake.  Has been off of her GERD medication for about a week.  Will provide GI cocktail, dose of Protonix, obtain labs and reevaluate.  Reports no improvement in symptoms on reevaluation and requests a CT scan.  Will obtain a CT scan.  CBC is unremarkable.  CMP shows preserved renal function, normal electrolytes.  UA without evidence of UTI.  Does show large amounts of hemoglobin on UA however patient states she is on her menstrual  cycle currently.  hCG negative.  Troponin undetectable.  Do not need to repeat given symptoms have been ongoing for at least 3 days.  EKG without acute ischemic changes.  Chest x-ray without acute cardiopulmonary process.  Low heart score.  Lipase within normal limits.  CT scan without acute intra-abdominal or pelvic finding to explain patient's symptomatology.  Discussed follow-up with PCP.  Refills for medication provided.  Lab Tests: -I ordered, reviewed, and interpreted labs.   The pertinent results include:   Labs Reviewed  COMPREHENSIVE METABOLIC PANEL - Abnormal; Notable for the following components:      Result Value   AST 14 (*)    All other components within normal limits  URINALYSIS, ROUTINE W REFLEX MICROSCOPIC - Abnormal; Notable for the following components:   Color, Urine STRAW (*)    Hgb urine dipstick LARGE (*)    Bacteria, UA RARE (*)    All other components within normal limits  CBC WITH DIFFERENTIAL/PLATELET  LIPASE, BLOOD  I-STAT BETA HCG BLOOD, ED ([facility], [facility], [facility] ONLY)  TROPONIN I (HIGH SENSITIVITY)      EKG  EKG Interpretation  Date/Time:  Tuesday May 17 2022 10:04:13 EDT Ventricular Rate:  63 PR Interval:  216 QRS Duration: 75 QT Interval:  405 QTC Calculation: 415 R Axis:   47 Text Interpretation: Sinus arrhythmia Prolonged PR interval Probable left atrial enlargement Abnormal Q suggests anterior infarct no acute ST/T changes similar to Dec 2022 Confirmed by Pricilla Loveless [phone]) on 05/17/2022 11:03:28 AM         Imaging Studies ordered: I ordered imaging studies including CT abdomen pelvis with contrast I independently visualized and interpreted imaging. I agree with the radiologist interpretation   Medicines ordered and prescription drug management: Meds ordered this encounter  Medications   pantoprazole (PROTONIX) EC tablet 40 mg   alum & mag hydroxide-simeth (MAALOX/MYLANTA) 200-200-20 MG/5ML suspension 30 mL   lactated ringers bolus 1,000  mL   iohexol (OMNIPAQUE) 300 MG/ML solution 100 mL    -I have reviewed the patients home medicines and have made adjustments as needed  Reevaluation: After the interventions noted above, I reevaluated the patient and found that they have :improved  Co morbidities that complicate the patient evaluation  Past Medical History:  Diagnosis Date   Gestational diabetes 07/17/2019   Hypertension in pregnancy, preeclampsia, severe, delivered/postpartum 10/09/2019   PONV (postoperative nausea and vomiting)    Pregnancy induced hypertension    #4      Dispostion: Patient is appropriate for discharge.  Discharged in stable condition.  Return precaution discussed.  Patient voices understanding and is in agreement with the plan.  Final Clinical Impression(s) / ED Diagnoses Final diagnoses:  Abdominal pain, unspecified abdominal  location  Atypical chest pain    Rx / DC Orders ED Discharge Orders          Ordered    pantoprazole (PROTONIX) 40 MG tablet  Daily        05/17/22 1403              Marita [place], PA-C 05/17/22 1406    Pricilla Loveless, MD 05/20/22 1057

## 2022-05-17 NOTE — ED Notes (Signed)
Patient transported to CT 

## 2022-05-17 NOTE — ED Triage Notes (Signed)
C/o "heart spasms" abdominal pain that awakened her from sleep and unknown brusing to legs last month.

## 2022-05-17 NOTE — Discharge Instructions (Signed)
Positive for chest pain or abdominal pain.  Electrolytes were within normal limits.  I have sent in a refill for your omeprazole.  Please follow-up with your primary care provider.  For any concerning symptoms return to the emergency department.

## 2022-09-28 ENCOUNTER — Other Ambulatory Visit: Payer: Self-pay

## 2022-09-28 ENCOUNTER — Encounter (HOSPITAL_COMMUNITY): Payer: Self-pay

## 2022-09-28 ENCOUNTER — Emergency Department (HOSPITAL_COMMUNITY): Payer: No Typology Code available for payment source

## 2022-09-28 ENCOUNTER — Emergency Department (HOSPITAL_COMMUNITY)
Admission: EM | Admit: 2022-09-28 | Discharge: 2022-09-28 | Disposition: A | Discharge status: Home / Self Care | Payer: No Typology Code available for payment source | Attending: Emergency Medicine | Admitting: Emergency Medicine

## 2022-09-28 DIAGNOSIS — W228XXA Striking against or struck by other objects, initial encounter: Secondary | ICD-10-CM | POA: Diagnosis not present

## 2022-09-28 DIAGNOSIS — Y990 Civilian activity done for income or pay: Secondary | ICD-10-CM | POA: Insufficient documentation

## 2022-09-28 DIAGNOSIS — S99921A Unspecified injury of right foot, initial encounter: Secondary | ICD-10-CM | POA: Diagnosis present

## 2022-09-28 MED ORDER — PREDNISONE 20 MG PO TABS: 40.0000 mg | ORAL_TABLET | Freq: Every day | ORAL | 0 refills | Status: DC

## 2022-09-28 MED ORDER — KETOROLAC TROMETHAMINE 30 MG/ML IJ SOLN
30.0000 mg | Freq: Once | INTRAMUSCULAR | Status: AC
  Administered 2022-09-28: 30 mg via INTRAMUSCULAR
  Filled 2022-09-28: qty 1

## 2022-09-28 MED ORDER — HYDROCODONE-ACETAMINOPHEN 5-325 MG PO TABS: 1.0000 | ORAL_TABLET | Freq: Four times a day (QID) | ORAL | 0 refills | Status: DC | PRN

## 2022-09-28 NOTE — ED Triage Notes (Signed)
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  [facility] went to [facility] told she had a fx in right foot and told to wean off the boot they put her in.  Patient reports pain is worse and wants a second opinion.

## 2022-09-28 NOTE — ED Notes (Signed)
Patient verbalizes understanding of discharge instructions. Opportunity for questioning and answers were provided. Pt discharged from ED. 

## 2022-09-28 NOTE — Discharge Instructions (Signed)
As discussed, it is important to follow-up with both our orthopedic colleagues and with your occupational health office.  Please call both of those facilities today.  Return here for concerning changes in your condition.

## 2022-09-28 NOTE — ED Provider Notes (Signed)
[place] EMERGENCY DEPARTMENT AT [facility] Provider Note   CSN: [id number] Arrival date & time: 09/28/22  1000     History  Chief Complaint  Patient presents with   Foot Injury    Pamela Schaefer is a 32 y.o. female.  HPI Patient presents with pain in her right foot.  She had a workplace injury 5 days ago.  She notes that her foot was pushed backwards against a pallet jack.  No other injuries, but since that time she has a pain in the foot radiating up the right leg.  She went to urgent care, was placed in an immobilizer, but did not have orthopedics follow-up provided. In spite of NSAID she continues to have substantial pain.    Home Medications Prior to Admission medications   Medication Sig Start Date End Date Taking? Authorizing Provider  HYDROcodone-acetaminophen (NORCO/VICODIN) 5-325 MG tablet Take 1 tablet by mouth every 6 (six) hours as needed. 09/28/22  Yes Gerhard Munch, MD  predniSONE (DELTASONE) 20 MG tablet Take 2 tablets (40 mg total) by mouth daily with breakfast. For the next four days 09/28/22  Yes Gerhard Munch, MD  methocarbamol (ROBAXIN) 500 MG tablet Take 1 tablet (500 mg total) by mouth 2 (two) times daily. 05/17/22   Marita Kansas, PA-C  Multiple Vitamin (MULTIVITAMIN) tablet Take 1 tablet by mouth daily.    [provider]  Norethindrone-Ethinyl Estradiol-Fe Biphas (LO LOESTRIN FE) 1 MG-10 MCG / 10 MCG tablet Take 1 tablet by mouth daily. 04/15/21   Warden Fillers, MD  pantoprazole (PROTONIX) 40 MG tablet Take 1 tablet (40 mg total) by mouth daily. 05/17/22   Karie Mainland, Amjad, PA-C  sertraline (ZOLOFT) 25 MG tablet TAKE 1 TABLET (25 MG TOTAL) BY MOUTH DAILY. 06/23/21   de [place], Buren Kos, MD      Allergies    Penicillins    Review of Systems   Review of Systems  All other systems reviewed and are negative.   Physical Exam Updated Vital Signs BP 119/88 (BP Location: Right Arm)   Pulse 60   Temp 98.6 F (37 C) (Oral)   Resp 20    Ht 5\' 3"  (1.6 m)   Wt 81.6 kg   SpO2 100%   BMI 31.89 kg/m  Physical Exam Vitals and nursing note reviewed.  Constitutional:      General: She is not in acute distress.    Appearance: She is well-developed.  HENT:     Head: Normocephalic and atraumatic.  Eyes:     Conjunctiva/sclera: Conjunctivae normal.  Cardiovascular:     Rate and Rhythm: Normal rate and regular rhythm.  Pulmonary:     Effort: Pulmonary effort is normal. No respiratory distress.     Breath sounds: Normal breath sounds. No stridor.  Abdominal:     General: There is no distension.  Musculoskeletal:     Comments: Right foot with tenderness palpation in the mid dorsal area, she can wiggle all toes, move the foot in flexion extension appropriately, there is mild swelling on the dorsal surface.  Knee, calf unremarkable.  Skin:    General: Skin is warm and dry.  Neurological:     Mental Status: She is alert and oriented to person, place, and time.     Cranial Nerves: No cranial nerve deficit.  Psychiatric:        Mood and Affect: Mood normal.     ED Results / Procedures / Treatments   Labs (all labs ordered are  listed, but only abnormal results are displayed) Labs Reviewed - No data to display  EKG None  Radiology DG Foot Complete Right  Result Date: 09/28/2022 CLINICAL DATA:  Crush injury to the right foot this past Saturday. EXAM: RIGHT FOOT COMPLETE - 3+ VIEW COMPARISON:  None Available. FINDINGS: No fracture or dislocation. Mild hallux valgus deformity without associated degenerative change. Joint spaces are preserved. No erosions. No plantar calcaneal spur. Regional soft tissues appear normal. No radiopaque foreign body. IMPRESSION: 1. No acute findings. 2. Mild hallux valgus deformity without associated degenerative change. Electronically Signed   By: Simonne Come M.D.   On: 09/28/2022 11:51    Procedures Procedures    Medications Ordered in ED Medications  ketorolac (TORADOL) 30 MG/ML injection  30 mg (30 mg Intramuscular Given 09/28/22 1037)    ED Course/ Medical Decision Making/ A&P                                 Medical Decision Making Adult female presents after workplace injury 5 days ago.  With worsening pain, concern for fracture, swelling is present.  Patient has soft compartments, is distally neurovascular intact which are initially reassuring.  X-ray ordered, Toradol ordered.  Amount and/or Complexity of Data Reviewed Radiology: ordered and independent interpretation performed. Decision-making details documented in ED Course.  Risk Prescription drug management.   12:33 PM On repeat exam patient is awake, alert, in no distress.  We discussed x-ray results which I have reviewed myself.  No obvious fracture, some suspicion for soft tissue injury secondary to crush.  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         Final Clinical Impression(s) / ED Diagnoses Final diagnoses:  Injury of right foot, initial encounter    Rx / DC Orders ED Discharge Orders          Ordered    predniSONE (DELTASONE) 20 MG tablet  Daily with breakfast        09/28/22 1233    HYDROcodone-acetaminophen (NORCO/VICODIN) 5-325 MG tablet  Every 6 hours PRN        09/28/22 1233              Gerhard Munch, MD 09/28/22 1234

## 2022-09-30 ENCOUNTER — Other Ambulatory Visit: Payer: Self-pay | Admitting: Family Medicine

## 2022-09-30 DIAGNOSIS — Z1231 Encounter for screening mammogram for malignant neoplasm of breast: Secondary | ICD-10-CM

## 2022-10-13 DIAGNOSIS — Z1231 Encounter for screening mammogram for malignant neoplasm of breast: Secondary | ICD-10-CM

## 2022-10-17 ENCOUNTER — Emergency Department (HOSPITAL_COMMUNITY)
Admission: EM | Admit: 2022-10-17 | Discharge: 2022-10-17 | Disposition: A | Discharge status: Home / Self Care | Payer: No Typology Code available for payment source | Attending: Emergency Medicine | Admitting: Emergency Medicine

## 2022-10-17 ENCOUNTER — Other Ambulatory Visit: Payer: Self-pay

## 2022-10-17 ENCOUNTER — Emergency Department (HOSPITAL_COMMUNITY): Payer: No Typology Code available for payment source

## 2022-10-17 ENCOUNTER — Encounter (HOSPITAL_COMMUNITY): Payer: Self-pay

## 2022-10-17 DIAGNOSIS — M79671 Pain in right foot: Secondary | ICD-10-CM | POA: Diagnosis present

## 2022-10-17 MED ORDER — IBUPROFEN 400 MG PO TABS
600.0000 mg | ORAL_TABLET | Freq: Once | ORAL | Status: AC
  Administered 2022-10-17: 600 mg via ORAL
  Filled 2022-10-17: qty 1

## 2022-10-17 MED ORDER — ACETAMINOPHEN 500 MG PO TABS
1000.0000 mg | ORAL_TABLET | Freq: Once | ORAL | Status: AC
  Administered 2022-10-17: 1000 mg via ORAL
  Filled 2022-10-17: qty 2

## 2022-10-17 NOTE — ED Notes (Signed)
Pt well appearing upon transport to xray.

## 2022-10-17 NOTE — Discharge Instructions (Signed)
You were seen in the emergency department for your foot pain.  Your x-ray showed no new injuries and its likely continued pain from your crush injury and you may have some nerve damage in your foot.  You should continue to take your gabapentin as prescribed and can take Tylenol and Motrin both can be taken every 6 hours as needed.  You should follow-up with your sports medicine doctor to have your symptoms rechecked and to see if you may need further workup.  You should return to the emergency department if you have weakness in your foot, your toes turn black or blue or if you have any other new or concerning symptoms.

## 2022-10-17 NOTE — ED Notes (Signed)
Pt reports being seen for RLE multiple times after machinery at work dropped on foot. Pt reports being woken up by pain around 0430 this morning.

## 2022-10-17 NOTE — ED Triage Notes (Signed)
Pt to ED pov from home, ambulatory to triage. Pt hurt her right foot on 09/24/22 and is currently wearing boot. Pt states she has continued, worsening pain in right foot and calf. +PMS in toes. Pt states she has some numbness on great toe. Pt tender to touch.

## 2022-10-17 NOTE — ED Notes (Signed)
This RN reviewed discharge instructions with patient. She verbalized understanding and denied any further questions. PT well appearing upon discharge and reports tolerable pain. Pt ambulated with stable gait to exit. Pt endorses ride home.  

## 2022-10-17 NOTE — ED Notes (Signed)
Pt back from x-ray, NAD noted.

## 2022-10-17 NOTE — ED Provider Notes (Signed)
[place] EMERGENCY DEPARTMENT AT [facility] Provider Note   CSN: [id number] Arrival date & time: 10/17/22  0900     History  Chief Complaint  Patient presents with   Foot Pain    Pamela Schaefer is a 32 y.o. female.  Patient is a 32 year old female with no significant past medical history presenting to the emergency department with right foot pain.  Patient injured her foot about 3 weeks ago and had a crush injury to her foot without any fractures.  She states that she has been following with orthopedics and getting physical therapy however over the last few days her pain has increased.  She states that she is tingling in her foot and on the medial aspect of her lower leg.  She states that the pain is mostly in her midfoot and she feels a pulling sensation up her calf.  She denies any calf swelling.  She states that she has still been wearing the walking boot when ambulating.  She denies any weakness but reports significant increase pain with plantarflexion.  Per records, she was prescribed gabapentin from sports medicine which she states that she has been taking as prescribed. She states she  The history is provided by the patient.  Foot Pain       Home Medications Prior to Admission medications   Medication Sig Start Date End Date Taking? Authorizing Provider  HYDROcodone-acetaminophen (NORCO/VICODIN) 5-325 MG tablet Take 1 tablet by mouth every 6 (six) hours as needed. 09/28/22   Gerhard Munch, MD  methocarbamol (ROBAXIN) 500 MG tablet Take 1 tablet (500 mg total) by mouth 2 (two) times daily. 05/17/22   Marita Kansas, PA-C  Multiple Vitamin (MULTIVITAMIN) tablet Take 1 tablet by mouth daily.    [provider]  Norethindrone-Ethinyl Estradiol-Fe Biphas (LO LOESTRIN FE) 1 MG-10 MCG / 10 MCG tablet Take 1 tablet by mouth daily. 04/15/21   Warden Fillers, MD  pantoprazole (PROTONIX) 40 MG tablet Take 1 tablet (40 mg total) by mouth daily. 05/17/22   Marita Kansas, PA-C  predniSONE (DELTASONE) 20 MG tablet Take 2 tablets (40 mg total) by mouth daily with breakfast. For the next four days 09/28/22   Gerhard Munch, MD  sertraline (ZOLOFT) 25 MG tablet TAKE 1 TABLET (25 MG TOTAL) BY MOUTH DAILY. 06/23/21   de [place], Raymond J, MD      Allergies    Penicillins    Review of Systems   Review of Systems  Physical Exam Updated Vital Signs BP 119/88 (BP Location: Left Arm)   Pulse 67   Temp 98.3 F (36.8 C) (Oral)   Resp 18   Ht 5\' 3"  (1.6 m)   Wt 86.2 kg   LMP 10/17/2022   SpO2 100%   BMI 33.66 kg/m  Physical Exam Vitals and nursing note reviewed.  Constitutional:      General: She is not in acute distress.    Appearance: Normal appearance.  HENT:     Nose: Nose normal.  Eyes:     Extraocular Movements: Extraocular movements intact.  Cardiovascular:     Rate and Rhythm: Normal rate.     Pulses: Normal pulses.  Pulmonary:     Effort: Pulmonary effort is normal.  Musculoskeletal:        General: Normal range of motion.     Cervical back: Normal range of motion.     Comments: Tenderness to palpation of R midfoot and R medial malleolus, no significant swelling Plantar/dorsiflexion intact  though plantar flexion is somewhat limited due to pain No calf tenderness or swelling No palpable R knee joint effusion, flexion/extension intact  Skin:    General: Skin is warm and dry.  Neurological:     Mental Status: She is alert and oriented to person, place, and time.     Motor: No weakness.     Comments: Subjectively decreased sensation on medial ankle and calf  Psychiatric:        Mood and Affect: Mood normal.        Behavior: Behavior normal.     ED Results / Procedures / Treatments   Labs (all labs ordered are listed, but only abnormal results are displayed) Labs Reviewed - No data to display  EKG None  Radiology DG Foot Complete Right  Result Date: 10/17/2022 CLINICAL DATA:  Injury 09/24/2022. Continued right foot pain.  Worsening pain in right foot and calf. EXAM: RIGHT ANKLE - COMPLETE 3+ VIEW; RIGHT FOOT COMPLETE - 3+ VIEW COMPARISON:  right foot radiographs 09/28/2022 FINDINGS: Right ankle: Ankle mortise is symmetric and intact. Joint spaces are preserved. No joint effusion. No acute fracture or dislocation. Right foot: Normal bone mineralization. Mild-to-moderate hallux valgus without associated degenerative change. No acute fracture or dislocation. Joint spaces are preserved. IMPRESSION: 1. No acute fracture or dislocation of the right ankle or foot. 2. Mild-to-moderate hallux valgus without associated degenerative change. Electronically Signed   By: Neita Garnet M.D.   On: 10/17/2022 10:25   DG Ankle Complete Right  Result Date: 10/17/2022 CLINICAL DATA:  Injury 09/24/2022. Continued right foot pain. Worsening pain in right foot and calf. EXAM: RIGHT ANKLE - COMPLETE 3+ VIEW; RIGHT FOOT COMPLETE - 3+ VIEW COMPARISON:  right foot radiographs 09/28/2022 FINDINGS: Right ankle: Ankle mortise is symmetric and intact. Joint spaces are preserved. No joint effusion. No acute fracture or dislocation. Right foot: Normal bone mineralization. Mild-to-moderate hallux valgus without associated degenerative change. No acute fracture or dislocation. Joint spaces are preserved. IMPRESSION: 1. No acute fracture or dislocation of the right ankle or foot. 2. Mild-to-moderate hallux valgus without associated degenerative change. Electronically Signed   By: Neita Garnet M.D.   On: 10/17/2022 10:25    Procedures Procedures    Medications Ordered in ED Medications  acetaminophen (TYLENOL) tablet 1,000 mg (1,000 mg Oral Given 10/17/22 0953)  ibuprofen (ADVIL) tablet 600 mg (600 mg Oral Given 10/17/22 0953)    ED Course/ Medical Decision Making/ A&P Clinical Course as of 10/17/22 1140  Mon Oct 17, 2022  1030 No acute traumatic injury on XR. Likely continued pain from nerve injury and crush injury. Recommended Tylenol and Motrin  in addition to gabapentin and sports medicine follow up. [VK]    Clinical Course User Index [VK] Rexford Maus, DO                                 Medical Decision Making This patient presents to the ED with chief complaint(s) of foot pain with pertinent past medical history of recent crush injury to right foot which further complicates the presenting complaint. The complaint involves an extensive differential diagnosis and also carries with it a high risk of complications and morbidity.    The differential diagnosis includes patient has 2+ pulses making ischemic foot unlikely, considering occult fracture, nerve injury, no calf tenderness or swelling making a DVT unlikely, sprain  Additional history obtained: Additional history obtained from N/A Records reviewed  recent ED records  ED Course and Reassessment: On patient's arrival she is hemodynamically stable in no acute distress.  Patient does have significant midfoot tenderness and some mild medial malleolus tenderness.  She has not had repeat x-rays and will repeat x-ray today should she have had an occult fracture that did not appear on initial x-ray.  He does report some decrease sensation however strength is intact.  She is prescribed gabapentin by sports medicine and suspect she may have some nerve injury related to her crush injury.  She will be given additional Tylenol and Motrin for pain and will be closely reassessed.  Independent labs interpretation:  N/A  Independent visualization of imaging: - I independently visualized the following imaging with scope of interpretation limited to determining acute life threatening conditions related to emergency care: R foot/ankle XR, which revealed no acute traumatic injury  Consultation: - Consulted or discussed management/test interpretation w/ external professional: N/A  Consideration for admission or further workup: Patient has no emergent conditions requiring admission or  further work-up at this time and is stable for discharge home with primary care and ortho follow-up  Social Determinants of health: N/A    Amount and/or Complexity of Data Reviewed Radiology: ordered.  Risk OTC drugs.          Final Clinical Impression(s) / ED Diagnoses Final diagnoses:  Foot pain, right    Rx / DC Orders ED Discharge Orders     None         Rexford Maus, DO 10/17/22 1140

## 2022-11-18 ENCOUNTER — Encounter: Payer: Self-pay | Admitting: Physician Assistant

## 2023-02-09 ENCOUNTER — Ambulatory Visit: Payer: Medicaid Other | Admitting: Physician Assistant

## 2023-03-22 ENCOUNTER — Ambulatory Visit: Payer: Medicaid Other | Admitting: Gastroenterology

## 2023-03-22 NOTE — Progress Notes (Unsigned)
 Chief Complaint: Primary GI MD: Dr. Myrtie Neither  HPI:  33 year old with medical history as listed below presents for evaluation of  Last seen 2018 by Dr. Myrtie Neither At that time she was LLQ pain slow transit constipation consistent with IBS. Normal TSH and calcium. Was recommended OTC laxatives.  Seen in ED 05/2022 for abdominal pain with normal CBC, CMP, and lipase. CTAP w contrast showed no acute abnormality. Normal pancreas, liver, gallbladder, and bowel. Showed small hiatal hernia and s/p appendectomy.    Discussed the use of AI scribe software for clinical note transcription with the patient, who gave verbal consent to proceed.  History of Present Illness             PREVIOUS GI WORKUP     Past Medical History:  Diagnosis Date   Gestational diabetes 07/17/2019   Hypertension in pregnancy, preeclampsia, severe, delivered/postpartum 10/09/2019   PONV (postoperative nausea and vomiting)    Pregnancy induced hypertension    #4    Past Surgical History:  Procedure Laterality Date   CLAVICLE SURGERY     INDUCED ABORTION     LAPAROSCOPIC APPENDECTOMY N/A 10/25/2014   Procedure: APPENDECTOMY LAPAROSCOPIC;  Surgeon: Gaynelle Adu, MD;  Location: [facility] OR;  Service: General;  Laterality: N/A;    Current Outpatient Medications  Medication Sig Dispense Refill   HYDROcodone-acetaminophen (NORCO/VICODIN) 5-325 MG tablet Take 1 tablet by mouth every 6 (six) hours as needed. 10 tablet 0   methocarbamol (ROBAXIN) 500 MG tablet Take 1 tablet (500 mg total) by mouth 2 (two) times daily. 20 tablet 0   Multiple Vitamin (MULTIVITAMIN) tablet Take 1 tablet by mouth daily.     Norethindrone-Ethinyl Estradiol-Fe Biphas (LO LOESTRIN FE) 1 MG-10 MCG / 10 MCG tablet Take 1 tablet by mouth daily. 30 tablet 6   pantoprazole (PROTONIX) 40 MG tablet Take 1 tablet (40 mg total) by mouth daily. 30 tablet 1   predniSONE (DELTASONE) 20 MG tablet Take 2 tablets (40 mg total) by mouth daily with breakfast. For the  next four days 8 tablet 0   sertraline (ZOLOFT) 25 MG tablet TAKE 1 TABLET (25 MG TOTAL) BY MOUTH DAILY. 90 tablet 1   No current facility-administered medications for this visit.    Allergies as of 03/23/2023 - Review Complete 10/17/2022  Allergen Reaction Noted   Penicillins Itching 03/02/2012    Family History  Problem Relation Age of Onset   Heart disease Maternal Grandfather    Kidney disease Maternal Grandfather        failure   Healthy Mother    Heart disease Father    Diabetes Maternal Grandmother    Hypertension Maternal Grandmother    Colon cancer Neg Hx    Stomach cancer Neg Hx    Rectal cancer Neg Hx    Esophageal cancer Neg Hx    Liver cancer Neg Hx     Social History   Socioeconomic History   Marital status: Single    Spouse name: Not on file   Number of children: 4   Years of education: Not on file   Highest education level: GED or equivalent  Occupational History   Occupation: [employer]    Comment: [employer] Part time  Tobacco Use   Smoking status: Former    Current packs/day: 0.25    Types: Cigarettes   Smokeless tobacco: Former  Building services engineer status: Never Used  Substance and Sexual Activity   Alcohol use: Yes  Comment: social   Drug use: No   Sexual activity: Yes    Partners: Male    Birth control/protection: None  Other Topics Concern   Not on file  Social History Narrative   Not on file   Social Drivers of Health   Financial Resource Strain: Not on file  Food Insecurity: No Food Insecurity (04/08/2019)   Hunger Vital Sign    Worried About Running Out of Food in the Last Year: Never true    Ran Out of Food in the Last Year: Never true  Transportation Needs: No Transportation Needs (04/08/2019)   PRAPARE - Administrator, Civil Service (Medical): No    Lack of Transportation (Non-Medical): No  Physical Activity: Inactive (04/08/2019)   Exercise Vital Sign    Days of Exercise per Week: 0 days    Minutes of  Exercise per Session: 0 min  Stress: Stress Concern Present (04/08/2019)   [employer] of Occupational Health - Occupational Stress Questionnaire    Feeling of Stress : Very much  Social Connections: Not on file  Intimate Partner Violence: Not At Risk (04/08/2019)   Humiliation, Afraid, Rape, and Kick questionnaire    Fear of Current or Ex-Partner: No    Emotionally Abused: No    Physically Abused: No    Sexually Abused: No    Review of Systems:    Constitutional: No weight loss, fever, chills, weakness or fatigue HEENT: Eyes: No change in vision               Ears, Nose, Throat:  No change in hearing or congestion Skin: No rash or itching Cardiovascular: No chest pain, chest pressure or palpitations   Respiratory: No SOB or cough Gastrointestinal: See HPI and otherwise negative Genitourinary: No dysuria or change in urinary frequency Neurological: No headache, dizziness or syncope Musculoskeletal: No new muscle or joint pain Hematologic: No bleeding or bruising Psychiatric: No history of depression or anxiety    Physical Exam:  Vital signs: There were no vitals taken for this visit.  Constitutional: NAD, Well developed, Well nourished, alert and cooperative Head:  Normocephalic and atraumatic. Eyes:   PEERL, EOMI. No icterus. Conjunctiva pink. Respiratory: Respirations even and unlabored. Lungs clear to auscultation bilaterally.   No wheezes, crackles, or rhonchi.  Cardiovascular:  Regular rate and rhythm. No peripheral edema, cyanosis or pallor.  Gastrointestinal:  Soft, nondistended, nontender. No rebound or guarding. Normal bowel sounds. No appreciable masses or hepatomegaly. Rectal:  Not performed.  Msk:  Symmetrical without gross deformities. Without edema, no deformity or joint abnormality.  Neurologic:  Alert and  oriented x4;  grossly normal neurologically.  Skin:   Dry and intact without significant lesions or rashes. Psychiatric: Oriented to person, place and  time. Demonstrates good judgement and reason without abnormal affect or behaviors.  Physical Exam           RELEVANT LABS AND IMAGING: CBC    Component Value Date/Time   WBC 5.0 05/17/2022 1030   RBC 4.49 05/17/2022 1030   HGB 13.4 05/17/2022 1030   HGB 9.4 (L) 07/12/2019 0845   HCT 40.5 05/17/2022 1030   HCT 28.8 (L) 07/12/2019 0845   PLT 246 05/17/2022 1030   PLT 271 07/12/2019 0845   MCV 90.2 05/17/2022 1030   MCV 90 07/12/2019 0845   MCH 29.8 05/17/2022 1030   MCHC 33.1 05/17/2022 1030   RDW 14.6 05/17/2022 1030   RDW 14.7 07/12/2019 0845   LYMPHSABS 1.8 05/17/2022 1030  LYMPHSABS 1.6 04/08/2019 0949   MONOABS 0.2 05/17/2022 1030   EOSABS 0.1 05/17/2022 1030   EOSABS 0.0 04/08/2019 0949   BASOSABS 0.0 05/17/2022 1030   BASOSABS 0.0 04/08/2019 0949    CMP     Component Value Date/Time   NA 137 05/17/2022 1030   NA 139 04/08/2019 0956   K 3.9 05/17/2022 1030   CL 105 05/17/2022 1030   CO2 25 05/17/2022 1030   GLUCOSE 94 05/17/2022 1030   BUN 11 05/17/2022 1030   BUN 6 04/08/2019 0956   CREATININE 0.59 05/17/2022 1030   CALCIUM 9.0 05/17/2022 1030   PROT 7.6 05/17/2022 1030   PROT 6.5 04/08/2019 0956   ALBUMIN 4.3 05/17/2022 1030   ALBUMIN 3.9 04/08/2019 0956   AST 14 (L) 05/17/2022 1030   ALT 13 05/17/2022 1030   ALKPHOS 42 05/17/2022 1030   BILITOT 0.8 05/17/2022 1030   BILITOT 0.2 04/08/2019 0956   GFRNONAA >60 05/17/2022 1030   GFRAA >60 08/25/2019 1950     Assessment/Plan:   Assessment and Plan                Derren Suydam Jolee Ewing [facility] 03/22/2023, 1:30 PM  Cc: de [place], Raymond J, MD

## 2023-03-23 ENCOUNTER — Encounter: Payer: Self-pay | Admitting: Gastroenterology

## 2023-03-23 ENCOUNTER — Ambulatory Visit: Admitting: Gastroenterology

## 2023-03-23 VITALS — BP 100/64 | HR 77 | Ht 63.0 in | Wt 193.0 lb

## 2023-03-23 DIAGNOSIS — R140 Abdominal distension (gaseous): Secondary | ICD-10-CM

## 2023-03-23 DIAGNOSIS — K5900 Constipation, unspecified: Secondary | ICD-10-CM | POA: Diagnosis not present

## 2023-03-23 DIAGNOSIS — R1013 Epigastric pain: Secondary | ICD-10-CM

## 2023-03-23 DIAGNOSIS — K219 Gastro-esophageal reflux disease without esophagitis: Secondary | ICD-10-CM

## 2023-03-23 DIAGNOSIS — Z8619 Personal history of other infectious and parasitic diseases: Secondary | ICD-10-CM

## 2023-03-23 NOTE — Patient Instructions (Addendum)
 Start taking Miralax 1 capful (17 grams) 1x / day for 1 week.   If this is not effective, increase to 1 dose 2x / day for 1 week.   If this is still not effective, increase to two capfuls (34 grams) 2x / day.   Can adjust dose as needed based on response. Can take 1/2 cap daily, skip days, or increase per day.    You have been scheduled for an endoscopy. Please follow written instructions given to you at your visit today.  If you use inhalers (even only as needed), please bring them with you on the day of your procedure. ___________________________________________________________________________  Due to recent changes in healthcare laws, you may see the results of your imaging and laboratory studies on MyChart before your provider has had a chance to review them.  We understand that in some cases there may be results that are confusing or concerning to you. Not all laboratory results come back in the same time frame and the provider may be waiting for multiple results in order to interpret others.  Please give [place] 48 hours in order for your provider to thoroughly review all the results before contacting the office for clarification of your results.   Thank you for trusting me with your gastrointestinal care!   Boone Master, PA

## 2023-03-27 NOTE — Progress Notes (Signed)
 ____________________________________________________________  Attending physician addendum:  Thank you for sending this case to me. I have reviewed the entire note and agree with the plan.  Symptoms seem more likely related to constipation and perhaps an element of maldigestion contributing to intestinal gas and bloating than from H. pylori.  Will biopsy stomach during EGD to confirm H. pylori eradication.  Other treatment efforts as you described.  Amada Jupiter, MD  ____________________________________________________________

## 2023-04-06 ENCOUNTER — Ambulatory Visit: Admitting: Gastroenterology

## 2023-04-06 ENCOUNTER — Encounter: Payer: Self-pay | Admitting: Gastroenterology

## 2023-04-06 VITALS — BP 119/77 | HR 62 | Temp 98.1°F | Resp 13 | Ht 63.0 in | Wt 193.0 lb

## 2023-04-06 DIAGNOSIS — K222 Esophageal obstruction: Secondary | ICD-10-CM | POA: Diagnosis not present

## 2023-04-06 DIAGNOSIS — K2950 Unspecified chronic gastritis without bleeding: Secondary | ICD-10-CM

## 2023-04-06 DIAGNOSIS — K209 Esophagitis, unspecified without bleeding: Secondary | ICD-10-CM | POA: Diagnosis not present

## 2023-04-06 DIAGNOSIS — B9681 Helicobacter pylori [H. pylori] as the cause of diseases classified elsewhere: Secondary | ICD-10-CM

## 2023-04-06 DIAGNOSIS — R12 Heartburn: Secondary | ICD-10-CM

## 2023-04-06 DIAGNOSIS — R140 Abdominal distension (gaseous): Secondary | ICD-10-CM

## 2023-04-06 DIAGNOSIS — K449 Diaphragmatic hernia without obstruction or gangrene: Secondary | ICD-10-CM | POA: Diagnosis not present

## 2023-04-06 DIAGNOSIS — Z8619 Personal history of other infectious and parasitic diseases: Secondary | ICD-10-CM

## 2023-04-06 DIAGNOSIS — R1013 Epigastric pain: Secondary | ICD-10-CM

## 2023-04-06 MED ORDER — SODIUM CHLORIDE 0.9 % IV SOLN: 500.0000 mL | Freq: Once | INTRAVENOUS | Status: AC

## 2023-04-06 NOTE — Progress Notes (Signed)
 Pt A/O x 3, gd SR's, pleased with anesthesia, report to RN

## 2023-04-06 NOTE — Progress Notes (Signed)
 Called to room to assist during endoscopic procedure.  Patient ID and intended procedure confirmed with present staff. Received instructions for my participation in the procedure from the performing physician.

## 2023-04-06 NOTE — Progress Notes (Signed)
 No significant changes to clinical history since GI office visit on 03/23/23.  The patient is appropriate for an endoscopic procedure in the ambulatory setting.  - Amada Jupiter, MD

## 2023-04-06 NOTE — Progress Notes (Signed)
 Pt's states no medical or surgical changes since previsit or office visit.

## 2023-04-06 NOTE — Patient Instructions (Signed)

## 2023-04-06 NOTE — Op Note (Signed)
 [facility] Patient Name: Pamela Schaefer Procedure Date: 04/06/2023 1:57 PM MRN: [id number] Endoscopist: Sherilyn Cooter L. Myrtie Neither , MD, [id number] Age: 33 Referring MD:  Date of Birth: [birth year] Gender: Female Account #: [id number] Procedure:                Upper GI endoscopy Indications:              Epigastric abdominal pain, Heartburn, Previously                            treated for Helicobacter pylori, Abdominal bloating Medicines:                Monitored Anesthesia Care Procedure:                Pre-Anesthesia Assessment:                           - Prior to the procedure, a History and Physical                            was performed, and patient medications and                            allergies were reviewed. The patient's tolerance of                            previous anesthesia was also reviewed. The risks                            and benefits of the procedure and the sedation                            options and risks were discussed with the patient.                            All questions were answered, and informed consent                            was obtained. Prior Anticoagulants: The patient has                            taken no anticoagulant or antiplatelet agents. ASA                            Grade Assessment: II - A patient with mild systemic                            disease. After reviewing the risks and benefits,                            the patient was deemed in satisfactory condition to                            undergo the procedure.  After obtaining informed consent, the endoscope was                            passed under direct vision. Throughout the                            procedure, the patient's blood pressure, pulse, and                            oxygen saturations were monitored continuously. The                            GIF [id number] #[id number] was introduced through the                             mouth, and advanced to the second part of duodenum.                            The upper GI endoscopy was accomplished without                            difficulty. The patient tolerated the procedure                            well. Scope In: Scope Out: Findings:                 Normal mucosa was found in the entire esophagus.                            Several biopsies were obtained in the middle third                            of the esophagus and in the lower third of the                            esophagus with cold forceps for histology. (r/o                            GERD vs EoE)                           A 2cm sliding hiatal hernia was present.                           A widely patent Schatzki ring was found at the EGJ.                           Normal mucosa was found in the entire examined                            stomach. Biopsies were taken with a cold forceps  for histology. (antrum and body in one jar to r/o H                            pylori)                           The cardia and gastric fundus were normal on                            retroflexion.                           The examined duodenum was normal. Biopsies for                            histology were taken with a cold forceps for                            evaluation of celiac disease. Complications:            No immediate complications. Estimated Blood Loss:     Estimated blood loss was minimal. Impression:               - Normal mucosa was found in the entire esophagus.                           - 2 cm hiatal hernia.                           - Widely patent Schatzki ring.                           - Normal mucosa was found in the entire stomach.                            Biopsied.                           - Normal examined duodenum. Biopsied.                           - Several biopsies were obtained in the middle                            third of the esophagus and  in the lower third of                            the esophagus.                           If Bx confirm H pylori eradication after prior                            treatment, then it was unrelated to these dyspeptic  symptoms. Recommendation:           - Patient has a contact number available for                            emergencies. The signs and symptoms of potential                            delayed complications were discussed with the                            patient. Return to normal activities tomorrow.                            Written discharge instructions were provided to the                            patient.                           - Resume previous diet.                           - Continue present medications.                           - Await pathology results.                           - Return to physician assistant at appointment to                            be scheduled. Ohm Dentler L. Myrtie Neither, MD 04/06/2023 2:34:48 PM This report has been signed electronically.

## 2023-04-07 ENCOUNTER — Telehealth: Payer: Self-pay

## 2023-04-07 ENCOUNTER — Telehealth: Payer: Self-pay | Admitting: Pediatrics

## 2023-04-07 MED ORDER — LIDOCAINE VISCOUS HCL 2 % MT SOLN: 10.0000 mL | Freq: Four times a day (QID) | OROMUCOSAL | 0 refills | Status: DC | PRN

## 2023-04-07 NOTE — Telephone Encounter (Signed)
 I have left her a detailed message to call us back and set [place] a follow up appointment with either Doug Sou PA or [name] PA. Marland Kitchen Had EGD 04/06/2023 for epigastric abdominal pain, heartburn.

## 2023-04-07 NOTE — Telephone Encounter (Signed)
 Left message on follow up call.

## 2023-04-07 NOTE — Telephone Encounter (Signed)
 Contacted on-call by Ms. Dunlop re: chest discomfort with swallowing status post EGD yesterday.  She underwent EGD with routine biopsies yesterday.  No dilation or other therapeutic interventions.  States that yesterday evening she began to notice some discomfort in her chest when swallowing solids or liquids.  No pain at rest.  No shortness of breath.  No oropharyngeal swelling or edema of uvula.  No hoarseness or throat pain.  Has also noted some discomfort under her right shoulder blade but states this could also be due to how she slept last night.  Mild nausea at times but no vomiting.  No fevers.  No history of cardiopulmonary problems.  Discussed that she may be experiencing some discomfort or sequelae related to biopsies that were performed.  Suggested a trial of Magic mouthwash to coat the esophagus and offered to send in a prescription to her pharmacy which may be available tomorrow.  No alarm features identified during telephone call to warrant ED presentation this evening.  Advised that if she develops worsening symptoms or respiratory issues she can contact the on-call pager back for additional advice.  Prescription for Magic mouthwash sent into [facility] on [address] 10 mL p.o. 4 times daily as needed chest discomfort 360 mL no refill.

## 2023-04-10 NOTE — Telephone Encounter (Signed)
 Thank you, Harriett Sine. Agree with the plan and appreciate the help.  HD

## 2023-04-11 LAB — SURGICAL PATHOLOGY

## 2023-04-12 ENCOUNTER — Telehealth: Payer: Self-pay | Admitting: Gastroenterology

## 2023-04-12 NOTE — Telephone Encounter (Signed)
 Inbound call from patient requesting a call to discuss results from 4/3 EGD. Please advise.

## 2023-04-12 NOTE — Telephone Encounter (Signed)
 Advised the pt that as soon as Dr Myrtie Neither reviews the path we will reach out to her with results and recommendations

## 2023-04-14 NOTE — Telephone Encounter (Signed)
 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 H pylori still needs to be treated since it was discovered by primary care in December 2024 and we know that H. pylori could increase the chances of stomach ulcers and stomach cancer in the future.   However, before I can retreat this H. pylori, I need records from [facility] and [facility]).  I need to know specifically what antibiotics they used to treat the H pylori because we need to use different antibiotics for retreatment.  If those records cannot be obtained from primary care, then perhaps the patient's usual pharmacy would be able to tell us what antibiotics were given.  Ellwood Dense MD

## 2023-04-14 NOTE — Telephone Encounter (Signed)
 I spoke with Pamela Schaefer and she set up an appointment with [name] PA for 06/08/2023 at 3pm. She said her pathology report popped in her [facility] and she wants someone to please call her with the results to put her mind at ease. I will forward this message to Dr Myrtie Neither and the nurses.

## 2023-04-14 NOTE — Telephone Encounter (Signed)
 Spoke with the pt and made her aware of the H pylori diagnosis.  She is going to get the names of the medications she was treated with by her PCP. She will send to [place] via My Chart.  Will await her response

## 2023-04-14 NOTE — Telephone Encounter (Signed)
 The pt returned call and states she was given pantoprazole 40 mg daily for 14 days, clarithromycin 500 mg BID for 14 days, and flagyl 500 mg every 8 hours for 14 days.    Please advise

## 2023-04-14 NOTE — Telephone Encounter (Signed)
 See alternate phone  note dated 4/11

## 2023-04-14 NOTE — Telephone Encounter (Signed)
 Inbound call from patient returning phone call regarding current medications. Please advise, thank you.

## 2023-04-15 NOTE — Telephone Encounter (Signed)
 Unfortunately, H. pylori is often resistant to clarithromycin and/or Flagyl.  Retreatment as follows:  Omeprazole 20 mg twice daily for 14 days Disp 28 tablets, refill 0  Amoxicillin 500 mg tablet 3 tablets twice daily for 14 days Dispense 84 tablets, refill 0  Rifabutin 150 mg capsule 1 capsule twice daily x 14 days Dispense 28 capsules, refill 0  Memory Staggers MD

## 2023-04-17 NOTE — Telephone Encounter (Signed)
 Dr Dominic Friendly I am getting a warning with the amoxicillin due to PCN allergy (itching)

## 2023-04-18 MED ORDER — OMEPRAZOLE 20 MG PO CPDR: 20.0000 mg | DELAYED_RELEASE_CAPSULE | Freq: Two times a day (BID) | ORAL | 0 refills | Status: DC

## 2023-04-18 MED ORDER — BISMUTH SUBSALICYLATE 262 MG PO TABS: 2.0000 | ORAL_TABLET | Freq: Four times a day (QID) | ORAL | 0 refills | Status: AC

## 2023-04-18 MED ORDER — TETRACYCLINE HCL 500 MG PO CAPS
500.0000 mg | ORAL_CAPSULE | Freq: Four times a day (QID) | ORAL | 0 refills | Status: AC
Start: 2023-04-18 — End: 2023-05-02

## 2023-04-18 MED ORDER — RIFABUTIN 150 MG PO CAPS: 150.0000 mg | ORAL_CAPSULE | Freq: Two times a day (BID) | ORAL | 0 refills | Status: AC

## 2023-04-18 NOTE — Telephone Encounter (Signed)
 Difficult to know if serious PCN allergy, but will avoid it.  New regimen:  Omeprazole and rifabutin as ordered in prior note And Tetracycline 500 mg  One tablet 4 times daily x 14 days Disp #56 , RF zero (Drink plenty of water after this)  And Bismuth subsalicylate 524mg  tablet One tablet 4 times daily x 14 days Disp #56, RF zero (Will turn stool dark)   - H. Dominic Friendly, MD

## 2023-04-18 NOTE — Telephone Encounter (Signed)
 Inbound call om patient in regards to today's prescription sent into the pharmacy.   States they are giving an error. Please advise.

## 2023-04-18 NOTE — Telephone Encounter (Signed)
 The pt has an "error" message from her pharmacy for the H pylori regimen.  I have advised her to call her pharmacy and inquire what the issue maybe.  We have not received any messages. She will let us  know if we need to help in any way.

## 2023-04-18 NOTE — Telephone Encounter (Signed)
 The pt has been advised of the new prescriptions.  She will pick up as soon as able.  We did discuss that she should drink plenty of water and her stools will be dark.  The pt has been advised of the information and verbalized understanding.

## 2023-04-20 ENCOUNTER — Other Ambulatory Visit (HOSPITAL_COMMUNITY): Payer: Self-pay

## 2023-04-20 ENCOUNTER — Telehealth: Payer: Self-pay

## 2023-04-20 NOTE — Telephone Encounter (Signed)
 Pharmacy Patient Advocate Encounter  Received notification from [facility] that Prior Authorization for Tetracycline HCl 500MG  capsules has been APPROVED from 04-20-2023 to 04-19-2024   PA #/Case ID/Reference #: Otilia Bloch

## 2023-04-20 NOTE — Telephone Encounter (Signed)
 Noted pt. advised

## 2023-04-20 NOTE — Telephone Encounter (Signed)
 Pharmacy Patient Advocate Encounter   Received notification from CoverMyMeds that prior authorization for Tetracycline HCl 500MG  capsules is required/requested.   Insurance verification completed.   The patient is insured through [facility] .   Per test claim: PA required; PA submitted to above mentioned insurance via CoverMyMeds Key/confirmation #/EOC [facility] Status is pending

## 2023-05-11 ENCOUNTER — Telehealth: Payer: Self-pay | Admitting: Gastroenterology

## 2023-05-11 ENCOUNTER — Other Ambulatory Visit: Payer: Self-pay

## 2023-05-11 MED ORDER — FLUCONAZOLE 150 MG PO TABS: 150.0000 mg | ORAL_TABLET | Freq: Every day | ORAL | 0 refills | Status: DC

## 2023-05-11 NOTE — Telephone Encounter (Signed)
 Spoke with the patient. Pharmacy confirmed. Patient thanks me for the call.

## 2023-05-11 NOTE — Telephone Encounter (Signed)
 PT is calling to ask for antibiotics because she has a yeast infection from the new medications she is on.

## 2023-05-11 NOTE — Telephone Encounter (Signed)
 Patient treated for H Pylori with tetracycline  and rifabutin . Started the antibiotics 04/21/23. She has developed vaginal discharge and itching. She tells me she recognizes this as a yeast infection which she has had in the past. Asks for treatment using oral medication. "Cream is too messy. It usually takes a 3 day treatment." Okay for Diflucan ?

## 2023-06-08 ENCOUNTER — Ambulatory Visit: Admitting: Physician Assistant

## 2023-06-08 ENCOUNTER — Encounter: Payer: Self-pay | Admitting: Physician Assistant

## 2023-06-08 VITALS — BP 100/64 | HR 67 | Ht 63.0 in | Wt 193.4 lb

## 2023-06-08 DIAGNOSIS — K219 Gastro-esophageal reflux disease without esophagitis: Secondary | ICD-10-CM

## 2023-06-08 DIAGNOSIS — Z8619 Personal history of other infectious and parasitic diseases: Secondary | ICD-10-CM

## 2023-06-08 MED ORDER — FAMOTIDINE 40 MG PO TABS: 40.0000 mg | ORAL_TABLET | Freq: Every day | ORAL | 11 refills | Status: AC | PRN

## 2023-06-08 NOTE — Progress Notes (Signed)
 Chief Complaint: Epigastric pain, heartburn and bloating  HPI:    Pamela Schaefer is a 33 year old female, assigned to Dr. Dominic Friendly, with a past medical history as listed below, who was referred to me by de [place], Alonza Jansky, MD for a complaint of gastric pain, heartburn and bloating.      03/23/2023 office visit with Sharlene Dawn, PA and at that time discussed symptoms more likely related to constipation perhaps an element of maldigestion contributing intestinal gas and bloating.  EGD was ordered.  She had had a recent diagnosis of H. pylori.    04/06/23 EGD with normal mucosa in the entire esophagus, 2 cm hiatal hernia, widely patent Schatzki's ring, several biopsies obtained from the middle third of the esophagus and lower third.  Biopsies still positive for H. pylori.  Patient was retreated for H. pylori.    Today, the patient tells me her symptoms are much much better.  She still has a little bit of indigestion though that seems to come and go if she eats the wrong things so she has been trying to avoid heavy sauces and spicy things.  She gets symptoms maybe 1 or 2 days a week.  She is asking what she should do for this.  Apparently was told she should not stay on a PPI long-term.    Denies fever, chills or weight loss.  Past Medical History:  Diagnosis Date   Gestational diabetes 07/17/2019   Hypertension in pregnancy, preeclampsia, severe, delivered/postpartum 10/09/2019   PONV (postoperative nausea and vomiting)    Pregnancy induced hypertension    #4    Past Surgical History:  Procedure Laterality Date   CLAVICLE SURGERY     INDUCED ABORTION     LAPAROSCOPIC APPENDECTOMY N/A 10/25/2014   Procedure: APPENDECTOMY LAPAROSCOPIC;  Surgeon: Aldean Hummingbird, MD;  Location: [facility] OR;  Service: General;  Laterality: N/A;    Current Outpatient Medications  Medication Sig Dispense Refill   famotidine (PEPCID) 40 MG tablet Take 40 mg by mouth daily.     fluconazole  (DIFLUCAN ) 150 MG tablet Take 1 tablet  (150 mg total) by mouth daily. 3 tablet 0   gabapentin (NEURONTIN) 300 MG capsule Take 300 mg by mouth daily.     KURVELO 0.15-30 MG-MCG tablet Take 1 tablet by mouth daily.     magic mouthwash (lidocaine , diphenhydrAMINE , alum & mag hydroxide) suspension Swish and swallow 10 mLs 4 (four) times daily as needed (Chest discomfort). 360 mL 0   Multiple Vitamin (MULTIVITAMIN) tablet Take 1 tablet by mouth daily.     naproxen (NAPROSYN) 500 MG tablet Take 500 mg by mouth 2 (two) times daily. (Patient not taking: Reported on 04/06/2023)     omeprazole  (PRILOSEC) 20 MG capsule Take 1 capsule (20 mg total) by mouth 2 (two) times daily before a meal for 14 days. 28 capsule 0   pantoprazole  (PROTONIX ) 40 MG tablet Take 1 tablet (40 mg total) by mouth daily. 30 tablet 1   traMADol (ULTRAM) 50 MG tablet Take 50 mg by mouth every 6 (six) hours as needed.     Vitamin D , Ergocalciferol , (DRISDOL) 1.25 MG (50000 UNIT) CAPS capsule Take 50,000 Units by mouth once a week.     Current Facility-Administered Medications  Medication Dose Route Frequency Provider Last Rate Last Admin   0.9 %  sodium chloride  infusion  500 mL Intravenous Once Danis, Henry L III, MD        Allergies as of 06/08/2023 - Review Complete 04/06/2023  Allergen  Reaction Noted   Penicillins Itching 03/02/2012    Family History  Problem Relation Age of Onset   Healthy Mother    Heart attack Father    Diabetes Maternal Grandmother    Hypertension Maternal Grandmother    Heart disease Maternal Grandfather    Kidney disease Maternal Grandfather        failure   Colon cancer Neg Hx    Stomach cancer Neg Hx    Rectal cancer Neg Hx    Esophageal cancer Neg Hx    Liver cancer Neg Hx     Social History   Socioeconomic History   Marital status: Single    Spouse name: Not on file   Number of children: 4   Years of education: Not on file   Highest education level: GED or equivalent  Occupational History   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    Comment: [employer] Part time  Tobacco Use   Smoking status: Former    Current packs/day: 0.25    Types: Cigarettes   Smokeless tobacco: Former  Building services engineer status: Never Used  Substance and Sexual Activity   Alcohol use: Yes    Comment: social   Drug use: No   Sexual activity: Yes    Partners: Male    Birth control/protection: Pill  Other Topics Concern   Not on file  Social History Narrative   Not on file   Social Drivers of Health   Financial Resource Strain: Not on file  Food Insecurity: No Food Insecurity (04/08/2019)   Hunger Vital Sign    Worried About Running Out of Food in the Last Year: Never true    Ran Out of Food in the Last Year: Never true  Transportation Needs: No Transportation Needs (04/08/2019)   PRAPARE - Administrator, Civil Service (Medical): No    Lack of Transportation (Non-Medical): No  Physical Activity: Inactive (04/08/2019)   Exercise Vital Sign    Days of Exercise per Week: 0 days    Minutes of Exercise per Session: 0 min  Stress: Stress Concern Present (04/08/2019)   [employer] of Occupational Health - Occupational Stress Questionnaire    Feeling of Stress : Very much  Social Connections: Not on file  Intimate Partner Violence: Not At Risk (04/08/2019)   Humiliation, Afraid, Rape, and Kick questionnaire    Fear of Current or Ex-Partner: No    Emotionally Abused: No    Physically Abused: No    Sexually Abused: No    Review of Systems:    Constitutional: No weight loss, fever or chills Cardiovascular: No chest pain Respiratory: No SOB  Gastrointestinal: See HPI and otherwise negative   Physical Exam:  Vital signs: BP 100/64   Pulse 67   Ht 5\' 3"  (1.6 m)   Wt 193 lb 6.4 oz (87.7 kg)   BMI 34.26 kg/m    Constitutional:   Pleasant AA female appears to be in NAD, Well developed, Well nourished, alert and cooperative Respiratory: Respirations even and unlabored. Lungs clear to auscultation bilaterally.    No wheezes, crackles, or rhonchi.  Cardiovascular: Normal S1, S2. No MRG. Regular rate and rhythm. No peripheral edema, cyanosis or pallor.  Gastrointestinal:  Soft, nondistended, nontender. No rebound or guarding. Normal bowel sounds. No appreciable masses or hepatomegaly. Psychiatric: Oriented to person, place and time. Demonstrates good judgement and reason without abnormal affect or behaviors.  RELEVANT LABS AND IMAGING: CBC    Component Value Date/Time  WBC 5.0 05/17/2022 1030   RBC 4.49 05/17/2022 1030   HGB 13.4 05/17/2022 1030   HGB 9.4 (L) 07/12/2019 0845   HCT 40.5 05/17/2022 1030   HCT 28.8 (L) 07/12/2019 0845   PLT 246 05/17/2022 1030   PLT 271 07/12/2019 0845   MCV 90.2 05/17/2022 1030   MCV 90 07/12/2019 0845   MCH 29.8 05/17/2022 1030   MCHC 33.1 05/17/2022 1030   RDW 14.6 05/17/2022 1030   RDW 14.7 07/12/2019 0845   LYMPHSABS 1.8 05/17/2022 1030   LYMPHSABS 1.6 04/08/2019 0949   MONOABS 0.2 05/17/2022 1030   EOSABS 0.1 05/17/2022 1030   EOSABS 0.0 04/08/2019 0949   BASOSABS 0.0 05/17/2022 1030   BASOSABS 0.0 04/08/2019 0949    CMP     Component Value Date/Time   NA 137 05/17/2022 1030   NA 139 04/08/2019 0956   K 3.9 05/17/2022 1030   CL 105 05/17/2022 1030   CO2 25 05/17/2022 1030   GLUCOSE 94 05/17/2022 1030   BUN 11 05/17/2022 1030   BUN 6 04/08/2019 0956   CREATININE 0.59 05/17/2022 1030   CALCIUM 9.0 05/17/2022 1030   PROT 7.6 05/17/2022 1030   PROT 6.5 04/08/2019 0956   ALBUMIN 4.3 05/17/2022 1030   ALBUMIN 3.9 04/08/2019 0956   AST 14 (L) 05/17/2022 1030   ALT 13 05/17/2022 1030   ALKPHOS 42 05/17/2022 1030   BILITOT 0.8 05/17/2022 1030   BILITOT 0.2 04/08/2019 0956   GFRNONAA >60 05/17/2022 1030   GFRAA >60 08/25/2019 1950    Assessment: 1.  GERD: Continues with a small amount of indigestion, nothing like when she came here at first time was diagnosed with H. Pylori; likely mild chronic gastritis 2.  History of H. pylori gastritis:  Treated x 2, finished last treatment about a month and a half ago  Plan: 1.  [facility] testing for H. pylori 2.  Prescribed Famotidine 40 mg as needed for indigestion.  #30 with 11 refills. 3.  Discussed with patient that if reflux symptoms become more frequent then we should switch to Omeprazole  20 mg daily. 4.  Reviewed antireflux diet and lifestyle modifications. 5.  Patient to follow in clinic with us  as needed.  Reginal Capra, PA-C [facility] 06/08/2023, 3:05 PM  Cc: de [place], Raymond J, MD

## 2023-06-08 NOTE — Patient Instructions (Signed)
 We have sent the following medications to your pharmacy for you to pick up at your convenience: Famotidine 40 mg daily as needed.   Your provider has ordered "[facility]" stool testing for you. You have received a kit from our office today containing all necessary supplies to complete this test. Please carefully read the stool collection instructions provided in the kit before opening the accompanying materials. In addition, be sure there is a label providing your full name and date of birth on the "puritan opti-swab" tube that is supplied in the kit (if you do not see a label with this information on your test tube, please make us  aware before test collection!). After completing the test, you should secure the purtian tube into the specimen biohazard bag. The [facility] E-Req sheet (including date and time of specimen collection) should be placed into the outside pocket of the specimen biohazard bag and returned to the [place] lab (basement floor of [facility]) within 3 days of collection. Please make sure to give the specimen to a staff member at the lab. DO NOT leave the specimen on the counter.   If the specimen date and time (can be found in the upper right boxed portion of the sheet) are not filled out on the E-Req sheet, the test will NOT be performed.   _______________________________________________________  If your blood pressure at your visit was 140/90 or greater, please contact your primary care physician to follow up on this.  _______________________________________________________  If you are age 31 or older, your body mass index should be between 23-30. Your Body mass index is 34.26 kg/m. If this is out of the aforementioned range listed, please consider follow up with your Primary Care Provider.  If you are age 37 or younger, your body mass index should be between 19-25. Your Body mass index is 34.26 kg/m. If this is out of the aformentioned range  listed, please consider follow up with your Primary Care Provider.   ________________________________________________________  The [facility] providers would like to encourage you to use MYCHART to communicate with providers for non-urgent requests or questions.  Due to long hold times on the telephone, sending your provider a message by [facility] may be a faster and more efficient way to get a response.  Please allow 48 business hours for a response.  Please remember that this is for non-urgent requests.  _______________________________________________________

## 2023-06-13 NOTE — Progress Notes (Signed)
 ____________________________________________________________  Attending physician addendum:  Thank you for sending this case to me. I have reviewed the entire note and agree with the plan.  I suspect this patient has an element of nonulcer dyspepsia in addition to the H. pylori infection that was treated.  Lorella Roles, MD  ____________________________________________________________

## 2023-06-19 ENCOUNTER — Telehealth: Payer: Self-pay

## 2023-06-19 ENCOUNTER — Other Ambulatory Visit: Payer: Self-pay | Admitting: Physician Assistant

## 2023-06-19 NOTE — Telephone Encounter (Signed)
 Patient advised.

## 2023-06-19 NOTE — Progress Notes (Signed)
 06/19/2023 lab results from [facility] H. pylori testing show this is negative.  Patient will be alerted by her nursing staff.  Reginal Capra, PA-C

## 2023-06-19 NOTE — Telephone Encounter (Signed)
-----   Message from Graciella Lavender sent at 06/19/2023 10:34 AM EDT ----- Regarding: Please let patient know that H. pylori testing is now negative. Please let patient know that she is negative for H. pylori.  Thanks, JL L

## 2023-07-31 ENCOUNTER — Encounter: Payer: Self-pay | Admitting: Physician Assistant

## 2023-11-10 ENCOUNTER — Encounter (HOSPITAL_COMMUNITY): Payer: Self-pay

## 2023-11-10 ENCOUNTER — Other Ambulatory Visit: Payer: Self-pay

## 2023-11-10 ENCOUNTER — Emergency Department (HOSPITAL_COMMUNITY)
Admission: EM | Admit: 2023-11-10 | Discharge: 2023-11-10 | Discharge status: Against Medical Advice | Attending: Emergency Medicine | Admitting: Emergency Medicine

## 2023-11-10 DIAGNOSIS — R1032 Left lower quadrant pain: Secondary | ICD-10-CM | POA: Diagnosis present

## 2023-11-10 DIAGNOSIS — M549 Dorsalgia, unspecified: Secondary | ICD-10-CM | POA: Diagnosis not present

## 2023-11-10 DIAGNOSIS — Z5321 Procedure and treatment not carried out due to patient leaving prior to being seen by health care provider: Secondary | ICD-10-CM | POA: Diagnosis not present

## 2023-11-10 LAB — CBC WITH DIFFERENTIAL/PLATELET
Abs Immature Granulocytes: 0.02 K/uL (ref 0.00–0.07)
Basophils Absolute: 0 K/uL (ref 0.0–0.1)
Basophils Relative: 0 %
Eosinophils Absolute: 0.1 K/uL (ref 0.0–0.5)
Eosinophils Relative: 1 %
HCT: 38.5 % (ref 36.0–46.0)
Hemoglobin: 12.7 g/dL (ref 12.0–15.0)
Immature Granulocytes: 0 %
Lymphocytes Relative: 32 %
Lymphs Abs: 2.4 K/uL (ref 0.7–4.0)
MCH: 29.2 pg (ref 26.0–34.0)
MCHC: 33 g/dL (ref 30.0–36.0)
MCV: 88.5 fL (ref 80.0–100.0)
Monocytes Absolute: 0.4 K/uL (ref 0.1–1.0)
Monocytes Relative: 5 %
Neutro Abs: 4.7 K/uL (ref 1.7–7.7)
Neutrophils Relative %: 62 %
Platelets: 284 K/uL (ref 150–400)
RBC: 4.35 MIL/uL (ref 3.87–5.11)
RDW: 14.4 % (ref 11.5–15.5)
WBC: 7.6 K/uL (ref 4.0–10.5)
nRBC: 0 % (ref 0.0–0.2)

## 2023-11-10 LAB — COMPREHENSIVE METABOLIC PANEL WITH GFR
ALT: 17 U/L (ref 0–44)
AST: 17 U/L (ref 15–41)
Albumin: 3.9 g/dL (ref 3.5–5.0)
Alkaline Phosphatase: 36 U/L — ABNORMAL LOW (ref 38–126)
Anion gap: 11 (ref 5–15)
BUN: 13 mg/dL (ref 6–20)
CO2: 23 mmol/L (ref 22–32)
Calcium: 8.9 mg/dL (ref 8.9–10.3)
Chloride: 105 mmol/L (ref 98–111)
Creatinine, Ser: 0.84 mg/dL (ref 0.44–1.00)
GFR, Estimated: >60 mL/min (ref >60)
Glucose, Bld: 111 mg/dL — ABNORMAL HIGH (ref 70–99)
Potassium: 4 mmol/L (ref 3.5–5.1)
Sodium: 139 mmol/L (ref 135–145)
Total Bilirubin: 0.4 mg/dL (ref 0.0–1.2)
Total Protein: 6.9 g/dL (ref 6.5–8.1)

## 2023-11-10 LAB — URINALYSIS, ROUTINE W REFLEX MICROSCOPIC
Bilirubin Urine: NEGATIVE
Glucose, UA: NEGATIVE mg/dL
Hgb urine dipstick: NEGATIVE
Ketones, ur: NEGATIVE mg/dL
Leukocytes,Ua: NEGATIVE
Nitrite: NEGATIVE
Protein, ur: NEGATIVE mg/dL
Specific Gravity, Urine: 1.027 (ref 1.005–1.030)
pH: 6 (ref 5.0–8.0)

## 2023-11-10 LAB — LIPASE, BLOOD: Lipase: 22 U/L (ref 11–51)

## 2023-11-10 LAB — HCG, SERUM, QUALITATIVE: Preg, Serum: NEGATIVE

## 2023-11-10 NOTE — ED Notes (Signed)
 Called pt 3x for vitals, and received no response.

## 2023-11-10 NOTE — ED Provider Triage Note (Signed)
 Emergency Medicine Provider Triage Evaluation Note  Pamela Schaefer , a 33 y.o. female  was evaluated in triage.  Pt complains of lower abd pain for several days. No urinary sxs, diarrhea or vomiting. Lmp 10/09/23 Review of Systems  Positive: Lower abd pain  Negative: fever  Physical Exam  BP 113/82 (BP Location: Right Arm)   Pulse 81   Temp 98.1 F (36.7 C)   Resp 17   Ht 5' 3 (1.6 m)   Wt 81.6 kg   LMP 10/09/2023 (Exact Date)   SpO2 100%   BMI 31.89 kg/m  Gen:   Awake, no distress   Resp:  Normal effort  MSK:   Moves extremities without difficulty  Other:  Np guarding or rebound  Medical Decision Making  Medically screening exam initiated at 2:10 PM.  Appropriate orders placed.  Pamela Schaefer was informed that the remainder of the evaluation will be completed by another provider, this initial triage assessment does not replace that evaluation, and the importance of remaining in the ED until their evaluation is complete.     Pamela Chroman, PA-C 11/10/23 1414

## 2023-11-10 NOTE — ED Triage Notes (Signed)
 Patient states she has been having abdominal and back pain for the past week and a half. She states the pain started LLQ and has migrated to around her belly button. Denies nausea, vomiting, diarrhea, chill and fevers.

## 2023-12-29 ENCOUNTER — Encounter (HOSPITAL_COMMUNITY): Payer: Self-pay | Admitting: Emergency Medicine

## 2023-12-29 ENCOUNTER — Emergency Department (HOSPITAL_COMMUNITY)

## 2023-12-29 ENCOUNTER — Emergency Department (HOSPITAL_COMMUNITY)
Admission: EM | Admit: 2023-12-29 | Discharge: 2023-12-29 | Discharge status: Against Medical Advice | Attending: Emergency Medicine | Admitting: Emergency Medicine

## 2023-12-29 ENCOUNTER — Other Ambulatory Visit: Payer: Self-pay

## 2023-12-29 DIAGNOSIS — Z5321 Procedure and treatment not carried out due to patient leaving prior to being seen by health care provider: Secondary | ICD-10-CM | POA: Diagnosis not present

## 2023-12-29 DIAGNOSIS — G43909 Migraine, unspecified, not intractable, without status migrainosus: Secondary | ICD-10-CM | POA: Insufficient documentation

## 2023-12-29 DIAGNOSIS — M79602 Pain in left arm: Secondary | ICD-10-CM | POA: Diagnosis not present

## 2023-12-29 DIAGNOSIS — R079 Chest pain, unspecified: Secondary | ICD-10-CM | POA: Diagnosis present

## 2023-12-29 LAB — CBC
HCT: 41.7 % (ref 36.0–46.0)
Hemoglobin: 13.3 g/dL (ref 12.0–15.0)
MCH: 28.8 pg (ref 26.0–34.0)
MCHC: 31.9 g/dL (ref 30.0–36.0)
MCV: 90.3 fL (ref 80.0–100.0)
Platelets: 274 K/uL (ref 150–400)
RBC: 4.62 MIL/uL (ref 3.87–5.11)
RDW: 14.6 % (ref 11.5–15.5)
WBC: 9.3 K/uL (ref 4.0–10.5)
nRBC: 0 % (ref 0.0–0.2)

## 2023-12-29 LAB — BASIC METABOLIC PANEL WITH GFR
Anion gap: 12 (ref 5–15)
BUN: 15 mg/dL (ref 6–20)
CO2: 24 mmol/L (ref 22–32)
Calcium: 9.7 mg/dL (ref 8.9–10.3)
Chloride: 103 mmol/L (ref 98–111)
Creatinine, Ser: 0.74 mg/dL (ref 0.44–1.00)
GFR, Estimated: >60 mL/min
Glucose, Bld: 97 mg/dL (ref 70–99)
Potassium: 3.9 mmol/L (ref 3.5–5.1)
Sodium: 139 mmol/L (ref 135–145)

## 2023-12-29 LAB — HCG, SERUM, QUALITATIVE: Preg, Serum: NEGATIVE

## 2023-12-29 LAB — TROPONIN T, HIGH SENSITIVITY: Troponin T High Sensitivity: <15 ng/L (ref 0–19)

## 2023-12-29 NOTE — ED Triage Notes (Signed)
 Pt c.o central chest pain that started 30 mins PTA with associated left arm pain. Pt also c.o cold sweats

## 2023-12-29 NOTE — ED Triage Notes (Signed)
 Pt complains of chest pain that started 45 mins ago and then about 15 mins ago felt pain in left elbow. PT was driving to grocery store when pain started. Hx of anxiety and states this feels different

## 2023-12-29 NOTE — ED Notes (Signed)
 Pt left AMA due to long ED wait time.

## 2023-12-29 NOTE — ED Triage Notes (Signed)
 Pt also complains of migraine every morning for past 4 days.
